# Patient Record
Sex: Male | Born: 1945 | Race: Black or African American | Hispanic: No | Marital: Married | Smoking: Never smoker
Health system: Southern US, Community
[De-identification: ages and names within clinical notes are randomized; demographics above are authoritative.]

## PROBLEM LIST (undated history)

## (undated) DIAGNOSIS — I10 Essential (primary) hypertension: Secondary | ICD-10-CM

## (undated) DIAGNOSIS — Z8709 Personal history of other diseases of the respiratory system: Secondary | ICD-10-CM

## (undated) DIAGNOSIS — N189 Chronic kidney disease, unspecified: Secondary | ICD-10-CM

## (undated) DIAGNOSIS — E119 Type 2 diabetes mellitus without complications: Secondary | ICD-10-CM

## (undated) DIAGNOSIS — C801 Malignant (primary) neoplasm, unspecified: Secondary | ICD-10-CM

## (undated) HISTORY — PX: TONSILLECTOMY: SUR1361

## (undated) HISTORY — PX: NASAL SINUS SURGERY: SHX719

## (undated) HISTORY — DX: Type 2 diabetes mellitus without complications: E11.9

---

## 1998-03-03 ENCOUNTER — Other Ambulatory Visit: Admission: RE | Admit: 1998-03-03 | Discharge: 1998-03-03 | Payer: Self-pay | Admitting: Otolaryngology

## 2002-12-31 ENCOUNTER — Ambulatory Visit (HOSPITAL_COMMUNITY): Admission: RE | Admit: 2002-12-31 | Discharge: 2002-12-31 | Payer: Self-pay | Admitting: Pulmonary Disease

## 2002-12-31 ENCOUNTER — Encounter: Payer: Self-pay | Admitting: Pulmonary Disease

## 2007-03-04 ENCOUNTER — Ambulatory Visit (HOSPITAL_COMMUNITY): Admission: RE | Admit: 2007-03-04 | Discharge: 2007-03-05 | Payer: Self-pay | Admitting: Otolaryngology

## 2007-03-04 ENCOUNTER — Encounter (INDEPENDENT_AMBULATORY_CARE_PROVIDER_SITE_OTHER): Payer: Self-pay | Admitting: Otolaryngology

## 2008-03-29 ENCOUNTER — Ambulatory Visit: Admission: RE | Admit: 2008-03-29 | Discharge: 2008-06-27 | Payer: Self-pay | Admitting: Radiation Oncology

## 2008-04-21 ENCOUNTER — Encounter: Admission: RE | Admit: 2008-04-21 | Discharge: 2008-04-21 | Payer: Self-pay | Admitting: Urology

## 2008-05-27 ENCOUNTER — Ambulatory Visit (HOSPITAL_BASED_OUTPATIENT_CLINIC_OR_DEPARTMENT_OTHER): Admission: RE | Admit: 2008-05-27 | Discharge: 2008-05-27 | Payer: Self-pay | Admitting: Urology

## 2010-07-10 LAB — CBC
HCT: 47.7 % (ref 39.0–52.0)
Hemoglobin: 15.7 g/dL (ref 13.0–17.0)
MCHC: 33 g/dL (ref 30.0–36.0)
MCV: 87.2 fL (ref 78.0–100.0)
Platelets: 215 10*3/uL (ref 150–400)
RBC: 5.47 MIL/uL (ref 4.22–5.81)
RDW: 14.3 % (ref 11.5–15.5)
WBC: 6 10*3/uL (ref 4.0–10.5)

## 2010-07-10 LAB — COMPREHENSIVE METABOLIC PANEL
ALT: 28 U/L (ref 0–53)
AST: 23 U/L (ref 0–37)
Albumin: 4.2 g/dL (ref 3.5–5.2)
Alkaline Phosphatase: 79 U/L (ref 39–117)
BUN: 22 mg/dL (ref 6–23)
CO2: 28 mEq/L (ref 19–32)
Calcium: 9.8 mg/dL (ref 8.4–10.5)
Chloride: 103 mEq/L (ref 96–112)
Creatinine, Ser: 1.11 mg/dL (ref 0.4–1.5)
GFR calc Af Amer: >60 mL/min (ref >60)
GFR calc non Af Amer: >60 mL/min (ref >60)
Glucose, Bld: 108 mg/dL — ABNORMAL HIGH (ref 70–99)
Potassium: 4.3 mEq/L (ref 3.5–5.1)
Sodium: 138 mEq/L (ref 135–145)
Total Bilirubin: 0.8 mg/dL (ref 0.3–1.2)
Total Protein: 6.6 g/dL (ref 6.0–8.3)

## 2010-07-10 LAB — APTT: aPTT: 30 seconds (ref 24–37)

## 2010-07-10 LAB — PROTIME-INR
INR: 1 (ref 0.00–1.49)
Prothrombin Time: 13 seconds (ref 11.6–15.2)

## 2010-08-07 NOTE — H&P (Signed)
NAMEABDULAHAD, Alec Jacobson                ACCOUNT NO.:  [id number]   MEDICAL RECORD NO.:  [id number]          PATIENT TYPE:  OIB   LOCATION:  2550                         FACILITY:  [facility]   PHYSICIAN:  Hermelinda Medicus, M.D.   DATE OF BIRTH:  03/12/1948   DATE OF ADMISSION:  03/04/2007  DATE OF DISCHARGE:                              HISTORY & PHYSICAL   HISTORY:  This patient is a 65 year old male whom I operated on when he  was age 37, in 64, where he had sinus issues.  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  He also has been apparently  outdoors a great deal and was exposed to a huge amount of dust, pine  pollen, grass pollen and had been using antibiotics and decongestants at  that time.  More recently his job has changed somewhat, but he has  returned.  He never really saw an allergist.  He never got allergy  shots.  He has used Veramyst nasal spray, but has bilateral polyps in  both nasal vestibules and in both maxillary and ethmoid sinuses.  He  also has frontal sinusitis.  His history is that he had an area where  the polyp had eroded the lamina papyracea on the right side.  He was  checked out very carefully in pathology and I was concerned about  inverted papilloma, but this was stated not to be the case.  But he now  re-enters the hospital here for a functional endoscopic sinus surgery,  bilateral frontal ethmoidectomy, bilateral maxillary sinus ostia  enlargement, with the removal of nasal polyps, being very careful of  that orbital wall lamina papyracea on the right side.  The patient is  well aware of this.  He has had some conjunctivitis in the past,  associated with sinusitis and apparently did have a dacryocystitis at  one time approximately three years ago.  He has been on antibiotics  recently, as well as a nasal spray and now enters for this surgery.   CURRENT  MEDICATIONS:  1. He is just taking Benicar/hydrochlorothiazide 40/12.5 mg.  2. Crestor 20 mg.  3. Veramyst.   ALLERGIES:  No known drug allergies.   PAST MEDICAL HISTORY:  He does have a slight blood pressure elevation at  150/90.   REVIEW OF SYSTEMS:  He has no pulmonary problems.  He does have the  hypertension problem, but pretty well under control.  He has no  endocrine, no urinary problems, no gastrointestinal problems.  His chest  x-ray is within normal limits.  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  He has never had any chest pain or any symptoms whatsoever.   PAST SURGICAL HISTORY:  The sinus surgery as previously mentioned.   PHYSICAL EXAMINATION:  GENERAL:  A well-developed and well-nourished  male, in no acute distress.  VITAL SIGNS:  Blood pressure 150/90, pulse 90.  HEENT:  Ears are clear.  The  tympanic membranes are clear and move well.  His nose shows nasal polyps on both sides, which are easy to see on  inspection.  He is somewhat congested.  Oral cavity is clear.  There is  no ulceration or mass.  Nasopharynx clear.  His sphenoid sinuses on CAT  scan are clear.  He does show the lamina papyracea dehiscence on that  right ethmoid on CAT scan.  His larynx is clear.  True cords and false  cords, epiglottis and base of tongue are clear of any ulceration or  mass.  Gag reflex normal.  The eustachian nerves are all symmetrical, as  is shoulder strength.  CHEST:  Clear.  No rubs, rhonchi or wheezes.  A chest x-ray is normal.  CARDIOVASCULAR:  No opening snaps, murmurs or gallops.  ABDOMEN:  Unremarkable.  EXTREMITIES:  Unremarkable.   INITIAL DIAGNOSES:  1. Bilateral sinusitis.  2. History of sinus surgery in 1999.  Right lamina papyracea      dehiscence at that time.  3. History of conjunctivitis.  4. History of allergic rhinitis and working in a contaminated      environment.   PLAN:   To do a functional endoscopic sinus surgery, bilateral nasal  polypectomy, bilateral maxillary sinus ostial enlargement with bilateral  frontal ethmoidectomies under local MAC anesthesia.  The patient is well  aware of the risks and gains of sinus surgery, and will be taking time  off.  No heavy lifting.  Will eat very carefully this evening, if he  gets sent home.  He may be kept for a 23-hour observation.           ______________________________  Hermelinda Medicus, M.D.     JC/MEDQ  D:  03/04/2007  T:  03/04/2007  Job:  [id number]   cc:   Mina Marble, M.D.

## 2010-08-07 NOTE — Op Note (Signed)
NAME:  Alec Jacobson, Alec Jacobson                ACCOUNT NO.:  [id number]   MEDICAL RECORD NO.:  [id number]          PATIENT TYPE:  AMB   LOCATION:  SDS                          FACILITY:  [facility]   PHYSICIAN:  Hermelinda Medicus, M.D.   DATE OF BIRTH:  03/12/1948   DATE OF PROCEDURE:  03/04/2007  DATE OF DISCHARGE:                               OPERATIVE REPORT   This patient is a 65 year old male who has had considerable dust and  contamination exposure of the past, who has had sinus surgery back in  1999 and now enters for sinus surgery once again.   PREOPERATIVE DIAGNOSIS:  1. Bilateral ethmoid, frontal sinusitis with nasal polyposis, with      bilateral maxillary sinusitis, right more than left.  2. 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   POSTOPERATIVE DIAGNOSES:  1. Bilateral ethmoid, frontal sinusitis with nasal polyposis, with      bilateral maxillary sinusitis, right more than left.  2. 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   OPERATOR:  Hermelinda Medicus, M.D.   ANESTHESIA:  Local MAC with Dr. Randa Evens, 1% Xylocaine with epinephrine 6  mL, and topical cocaine 200 mg.   The patient is aware the risks and gains.  He is aware of the fact that  he did have a lamina papyracea issue about 10 years ago.  An x-ray shows  a small dehiscence.  We will be very careful in that area when removing  the polyps.  He also has used nasal sprays but has never taken allergy  shots, but he has also changed his job to a much clear type of job.  He  is aware of the visual potential or diplopia potential and is aware that  after his surgery he is not to be doing any traveling or heavy lifting  for the next week to 2 weeks.   PROCEDURE:  The patient placed in the supine position, under local MAC  anesthesia he was anesthetized after prepping and draping with the usual  head drape.  The local anesthesia  above-mentioned was used and then the  left side was approached and considerable polyps that were totally  obliterating his nasal airway were removed, and these were extended into  the ethmoid sinus and the frontal sinus was suctioned of fluid, and  polyps were removed using the upbiting forceps and straight Blakesley-  Wilde forceps along with using the 0-degree scope.  Once these were  removed and were extending almost back to the choana, the maxillary  sinus was found to be in reasonably decent shape and was just increased,  the natural ostium was increased in size using the backbiting forceps.  Once this was completed, Gelfoam was placed within the ethmoid sinus  followed by a 10-cm Merocel.  The right side was then approached and was  further anesthetized with the 1% Xylocaine with epinephrine and topical  cocaine, and the polyps which were again huge and obstructive of the  entire nasal airway were removed  one by one very carefully, and as we  worked into the ethmoid sinus we worked medial to stay away the lamina  papyracea and not to disturb that area or cause any bleeding around the  orbit.  We removed most of these polyps.  We left a thickened area on  that lamina papyracea area and worked our way up to the frontal area,  where it would drain effectively, suctioned the frontal, and were able  to clear the polyps from the ethmoid sinus.  We checked his vision  during the time and checked for diplopia.  None was seen.  Once this was  completed, we then looked at the maxillary sinus on the right side and  increased its natural ostium and suctioned a considerable amount of  fluid from that side.  It was primarily fluid, not polyps.  The nose was  clear now.  Gelfoam was placed within the ethmoid sinus and then a 10-cm  Merocel pack was also placed.  The patient will be kept on cold  compresses and kept overnight here just to make sure that he has no  bleeding around that right eye and  he has had conjunctivitis and he has  had a dacryocystitis also associated  with that eye in the past.  The patient tolerated the procedure very  well, is as doing well postop.  His follow-up will be overnight at 23-  hour observation and then it will be in 5 days and 10 days, 3 weeks, 6  weeks, 3 months, 6 months and a year.           ______________________________  Hermelinda Medicus, M.D.     JC/MEDQ  D:  03/04/2007  T:  03/04/2007  Job:  [id number]   cc:   Mina Marble, M.D.

## 2010-08-07 NOTE — Op Note (Signed)
NAME:  Alec Jacobson, Alec Jacobson                ACCOUNT NO.:  [id number]   MEDICAL RECORD NO.:  [id number]          PATIENT TYPE:  AMB   LOCATION:  NESC                         FACILITY:  [facility]   PHYSICIAN:  Mark C. Vernie Ammons, M.D.  DATE OF BIRTH:  03/12/1948   DATE OF PROCEDURE:  05/27/2008  DATE OF DISCHARGE:                               OPERATIVE REPORT   PREOPERATIVE DIAGNOSIS:  Adenocarcinoma of the prostate.   POSTOPERATIVE DIAGNOSIS:  Adenocarcinoma of the prostate.   PROCEDURE:  Iodine-125 radioactive seed implantation.   SURGEON:  Mark C. Vernie Ammons, M.D.   RADIATION ONCOLOGIST:  Artist Pais. Kathrynn Running, M.D.   ANESTHESIA:  General.   DRAINS:  16-French Foley catheter.   BLOOD LOSS:  Minimal.   SPECIMENS:  None.   COMPLICATIONS:  None.   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  His clinical stage is T1c.  We have discussed the  treatment options and he has elected to proceed with radioactive seed  implant.   DESCRIPTION OF OPERATION:  After informed consent, the patient was  brought to the major OR and placed on the table, administered general  anesthesia, and then moved to the modified dorsal lithotomy position.  Genitalia and perineum was sterilely prepped and draped and a Foley  catheter was inserted in the bladder and filled with half strength  contrast.  A rectal tube as well as the transrectal ultrasound probe was  then placed in the rectum and real-time planning was performed by Dr.  Kathrynn Running.   After the plan was complete, I proceeded to use the Nucletron to place  the seeds according to the intraoperative plan.  During the procedure,  the Nucletron device malfunctioned and 10 needles required placement by  hand, which proceeded without difficulty, still using real-time  ultrasound for guidance.   I then removed the catheter and performed  cystoscopy and noted the  urethra to be normal down to the sphincter, which appears intact.  The  prostatic urethra revealed no lesions and no evidence of seeds within  the prostatic urethra.  I then entered the bladder and the bladder was  fully inspected and again noted to be free of any tumor, stones or  inflammatory lesions.  Ureteral orifices were noted to be of normal  configuration and position.  I inspected the floor of the bladder and  retroflexed the scope and I noted no seeds on the floor of the bladder,  and when the scope was retroflexed, I noted no seeds protruding from the  base of the prostate.   The cystoscope was removed and a new 16-French Foley catheter was  inserted in the bladder and the balloon was filled with water.  This was  connected to closed system drainage and the patient was awakened and  taken to recovery room in stable, satisfactory condition.  He tolerated  the procedure well with no intraoperative complications.   He will be given a prescription for Vicodin HP #  24 and Cipro 500 mg to  be taken b.i.d. for 5 days.  He will follow up with me in the office in  3 weeks.      Mark C. Vernie Ammons, M.D.  Electronically Signed     MCO/MEDQ  D:  05/27/2008  T:  05/27/2008  Job:  [id number]   cc:   Artist Pais Kathrynn Running, M.D.  Fax: [phone]

## 2010-11-13 ENCOUNTER — Emergency Department (HOSPITAL_COMMUNITY)
Admission: EM | Admit: 2010-11-13 | Discharge: 2010-11-13 | Disposition: A | Discharge status: Home / Self Care | Payer: Worker's Compensation | Attending: Emergency Medicine | Admitting: Emergency Medicine

## 2010-11-13 DIAGNOSIS — I10 Essential (primary) hypertension: Secondary | ICD-10-CM | POA: Insufficient documentation

## 2010-11-13 DIAGNOSIS — M79609 Pain in unspecified limb: Secondary | ICD-10-CM | POA: Insufficient documentation

## 2010-11-13 DIAGNOSIS — T6391XA Toxic effect of contact with unspecified venomous animal, accidental (unintentional), initial encounter: Secondary | ICD-10-CM | POA: Insufficient documentation

## 2010-11-13 DIAGNOSIS — E7800 Pure hypercholesterolemia, unspecified: Secondary | ICD-10-CM | POA: Insufficient documentation

## 2010-11-13 DIAGNOSIS — R229 Localized swelling, mass and lump, unspecified: Secondary | ICD-10-CM | POA: Insufficient documentation

## 2010-11-13 DIAGNOSIS — R609 Edema, unspecified: Secondary | ICD-10-CM | POA: Insufficient documentation

## 2010-11-13 DIAGNOSIS — T63481A Toxic effect of venom of other arthropod, accidental (unintentional), initial encounter: Secondary | ICD-10-CM | POA: Insufficient documentation

## 2010-12-31 LAB — CBC
HCT: 43.3
Hemoglobin: 15.1
MCHC: 34.8
MCV: 83.8
Platelets: 206
RBC: 5.17
RDW: 14.3
WBC: 5.7

## 2010-12-31 LAB — URINALYSIS, ROUTINE W REFLEX MICROSCOPIC
Bilirubin Urine: NEGATIVE
Glucose, UA: NEGATIVE
Ketones, ur: NEGATIVE
Leukocytes, UA: NEGATIVE
Nitrite: NEGATIVE
Protein, ur: NEGATIVE
Specific Gravity, Urine: 1.024
Urobilinogen, UA: 1
pH: 6.5

## 2010-12-31 LAB — BASIC METABOLIC PANEL
BUN: 17
Calcium: 9.5
GFR calc non Af Amer: >60
Glucose, Bld: 110 — ABNORMAL HIGH
Potassium: 4.5
Sodium: 139

## 2010-12-31 LAB — URINE MICROSCOPIC-ADD ON

## 2010-12-31 LAB — BASIC METABOLIC PANEL WITH GFR
CO2: 27
Chloride: 106
Creatinine, Ser: 1.03
GFR calc Af Amer: >60

## 2014-02-21 ENCOUNTER — Other Ambulatory Visit (HOSPITAL_COMMUNITY): Payer: Self-pay | Admitting: Otolaryngology

## 2014-04-01 NOTE — Pre-Procedure Instructions (Signed)
Alec Jacobson  04/01/2014    Your procedure is scheduled on:  Wednesday, January 20.  Report to [facility] Admitting at 9:15 AM.  Call this number if you have problems the morning of surgery: [phone]               For any other questions, please call [phone], Monday - Friday 8 AM - 4 PM.  Remember:   Do not eat food or drink liquids after midnight  Tuesday, January 19.  Take these medicines the morning of surgery with A SIP OF WATER: -   Do not wear jewelry, make-up or nail polish.  Do not wear lotions, powders, or perfumes.    Men may shave face and neck.  Do not bring valuables to the hospital.             [facility] is not responsible  for any belongings or valuables.               Contacts, dentures or bridgework may not be worn into surgery.  Leave suitcase in the car. After surgery it may be brought to your room.  For patients admitted to the hospital, discharge time is determined by your treatment team.               Patients discharged the day of surgery will not be allowed to drive home.  Name and phone number of your driver: -   Special Instructions: Review  [facility] - Preparing For Surgery.   Please read over the following fact sheets that you were given: Pain Booklet, Coughing and Deep Breathing and Surgical Site Infection Prevention

## 2014-04-04 ENCOUNTER — Encounter (HOSPITAL_COMMUNITY): Payer: Self-pay

## 2014-04-04 ENCOUNTER — Inpatient Hospital Stay (HOSPITAL_COMMUNITY)
Admission: RE | Admit: 2014-04-04 | Discharge: 2014-04-04 | Disposition: A | Discharge status: Home / Self Care | Payer: Self-pay | Source: Ambulatory Visit

## 2014-04-04 HISTORY — DX: Personal history of other diseases of the respiratory system: Z87.09

## 2014-04-08 NOTE — H&P (Signed)
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 He now presents for septoplasty and bilateral functional endoscopic sinus surgery.  Dr. Simeon Craft, [place] Dust has discussed the risks (bleeding, infection, need for revision surgery, septal perforation, anesthesia risks, scarring, etc.), benefits, and alternatives of this procedure. The patient understands the risks and would like to proceed with the procedure. The chances of success of the procedure are >50% and the patient understands this. I personally performed an examination of the patient within 24 hours of the procedure.  PAST MEDICAL HISTORY: Past Medical History  Diagnosis Date  . H/O allergic rhinitis     job related    PAST SURGICAL HISTORY: Past Surgical History  Procedure Laterality Date  . Nasal sinus surgery      MEDICATIONS: No current facility-administered medications on file prior to encounter.   No current outpatient prescriptions on file prior to encounter.    ALLERGIES: Allergies not on file  SOCIAL HISTORY: History   Social History  . Marital Status: Married    Spouse Name: N/A    Number of Children: N/A  . Years of Education: N/A   Occupational History  . Not on file.   Social History Main Topics  . Smoking status: Not on file  . Smokeless tobacco: Not on file  . Alcohol Use: Not on file  . Drug Use: Not on file  . Sexual Activity: Not on file   Other Topics Concern  . Not on file   Social History Narrative  . No narrative on file    FAMILY HISTORY:No family history on file.  REVIEW OF SYSTEMS:  HEENT: nasal congestion, sinusitis, facial pressure, nasal drainage, otherwise negative x 12 systems except per HPI  PHYSICAL EXAM:  GENERAL:  NAD VITAL SIGNS:   Filed Vitals:   04/13/14 0933  BP: 188/102   Pulse:   Temp:   Resp:   SKIN:  Warm,dry HEENT:  Bulky R>L nasal polyps and thick nasal mucous, leftward septal deviation NECK:  supple ABDOMEN:  soft MUSCULOSKELETAL: normal strength PSYCH:  Normal affect NEUROLOGIC:  CN 2-12 intact and symmetric  DIAGNOSTIC STUDIES: CT sinuses shows bulky R>L sinus polyposis and R>L pansinus opacification  ASSESSMENT AND PLAN: Plan to proceed with bilateral functional endoscopic sinus surgery with septoplasty and possible Draf III frontal sinusotomy. Patient understands the risks, benefits, and alternatives.  04/13/13  9:09 AM Alec Jacobson

## 2014-04-11 ENCOUNTER — Encounter (HOSPITAL_COMMUNITY): Payer: Self-pay

## 2014-04-11 ENCOUNTER — Encounter (HOSPITAL_COMMUNITY)
Admission: RE | Admit: 2014-04-11 | Discharge: 2014-04-11 | Disposition: A | Discharge status: Home / Self Care | Payer: 59 | Source: Ambulatory Visit | Attending: Otolaryngology | Admitting: Otolaryngology

## 2014-04-11 DIAGNOSIS — J320 Chronic maxillary sinusitis: Secondary | ICD-10-CM | POA: Diagnosis present

## 2014-04-11 DIAGNOSIS — E669 Obesity, unspecified: Secondary | ICD-10-CM | POA: Diagnosis not present

## 2014-04-11 DIAGNOSIS — J321 Chronic frontal sinusitis: Secondary | ICD-10-CM | POA: Diagnosis not present

## 2014-04-11 DIAGNOSIS — Z6832 Body mass index (BMI) 32.0-32.9, adult: Secondary | ICD-10-CM | POA: Diagnosis not present

## 2014-04-11 DIAGNOSIS — J322 Chronic ethmoidal sinusitis: Secondary | ICD-10-CM | POA: Diagnosis not present

## 2014-04-11 DIAGNOSIS — I10 Essential (primary) hypertension: Secondary | ICD-10-CM | POA: Diagnosis not present

## 2014-04-11 DIAGNOSIS — J323 Chronic sphenoidal sinusitis: Secondary | ICD-10-CM | POA: Diagnosis not present

## 2014-04-11 DIAGNOSIS — J339 Nasal polyp, unspecified: Secondary | ICD-10-CM | POA: Diagnosis not present

## 2014-04-11 HISTORY — DX: Malignant (primary) neoplasm, unspecified: C80.1

## 2014-04-11 HISTORY — DX: Essential (primary) hypertension: I10

## 2014-04-11 HISTORY — DX: Chronic kidney disease, unspecified: N18.9

## 2014-04-11 LAB — BASIC METABOLIC PANEL
ANION GAP: 7 (ref 5–15)
BUN: 19 mg/dL (ref 6–23)
CALCIUM: 9.2 mg/dL (ref 8.4–10.5)
CHLORIDE: 104 meq/L (ref 96–112)
CO2: 25 mmol/L (ref 19–32)
Creatinine, Ser: 1.04 mg/dL (ref 0.50–1.35)
GFR, EST AFRICAN AMERICAN: 84 mL/min — AB (ref >90)
GFR, EST NON AFRICAN AMERICAN: 72 mL/min — AB (ref >90)
Glucose, Bld: 120 mg/dL — ABNORMAL HIGH (ref 70–99)
Potassium: 4.8 mmol/L (ref 3.5–5.1)
Sodium: 136 mmol/L (ref 135–145)

## 2014-04-11 LAB — CBC
HEMATOCRIT: 44 % (ref 39.0–52.0)
Hemoglobin: 14.9 g/dL (ref 13.0–17.0)
MCH: 29 pg (ref 26.0–34.0)
MCHC: 33.9 g/dL (ref 30.0–36.0)
MCV: 85.6 fL (ref 78.0–100.0)
Platelets: 187 10*3/uL (ref 150–400)
RBC: 5.14 MIL/uL (ref 4.22–5.81)
RDW: 13.5 % (ref 11.5–15.5)
WBC: 3.9 10*3/uL — ABNORMAL LOW (ref 4.0–10.5)

## 2014-04-11 NOTE — Progress Notes (Signed)
   04/11/14 0820  OBSTRUCTIVE SLEEP APNEA  Have you ever been diagnosed with sleep apnea through a sleep study? No  Do you snore loudly (loud enough to be heard through closed doors)?  1 (only since polyps returned to his sinuses)  Do you often feel tired, fatigued, or sleepy during the daytime? 0  Has anyone observed you stop breathing during your sleep? 0  Do you have, or are you being treated for high blood pressure? 1  BMI more than 35 kg/m2? 1  Age over 38 years old? 1  Neck circumference greater than 40 cm/16 inches? 1  Gender: 1  Obstructive Sleep Apnea Score 6  Score 4 or greater  Results sent to PCP

## 2014-04-11 NOTE — Progress Notes (Addendum)
Denies any cardiac issues.  Has never seen a cardio.   Will call his PCP for comparison ekg, Dr. Katherine Roan [phone] Have called office twice, once this AM, and around 1:50pm today. Had to leave message.  DA

## 2014-04-13 ENCOUNTER — Encounter (HOSPITAL_COMMUNITY)
Admission: RE | Disposition: A | Discharge status: Home / Self Care | Payer: Self-pay | Source: Ambulatory Visit | Attending: Otolaryngology

## 2014-04-13 ENCOUNTER — Encounter (HOSPITAL_COMMUNITY): Payer: Self-pay | Admitting: *Deleted

## 2014-04-13 ENCOUNTER — Ambulatory Visit (HOSPITAL_COMMUNITY)
Admission: RE | Admit: 2014-04-13 | Discharge: 2014-04-13 | Disposition: A | Discharge status: Home / Self Care | Payer: 59 | Source: Ambulatory Visit | Attending: Otolaryngology | Admitting: Otolaryngology

## 2014-04-13 ENCOUNTER — Ambulatory Visit (HOSPITAL_COMMUNITY): Payer: 59 | Admitting: Anesthesiology

## 2014-04-13 DIAGNOSIS — E669 Obesity, unspecified: Secondary | ICD-10-CM | POA: Insufficient documentation

## 2014-04-13 DIAGNOSIS — J321 Chronic frontal sinusitis: Secondary | ICD-10-CM | POA: Diagnosis not present

## 2014-04-13 DIAGNOSIS — I10 Essential (primary) hypertension: Secondary | ICD-10-CM | POA: Insufficient documentation

## 2014-04-13 DIAGNOSIS — Z6832 Body mass index (BMI) 32.0-32.9, adult: Secondary | ICD-10-CM | POA: Insufficient documentation

## 2014-04-13 DIAGNOSIS — J339 Nasal polyp, unspecified: Secondary | ICD-10-CM | POA: Insufficient documentation

## 2014-04-13 DIAGNOSIS — J323 Chronic sphenoidal sinusitis: Secondary | ICD-10-CM | POA: Insufficient documentation

## 2014-04-13 DIAGNOSIS — J322 Chronic ethmoidal sinusitis: Secondary | ICD-10-CM | POA: Insufficient documentation

## 2014-04-13 HISTORY — PX: SINUS ENDO WITH FUSION: SHX5329

## 2014-04-13 SURGERY — SURGERY, PARANASAL SINUS, ENDOSCOPIC, WITH NASAL SEPTOPLASTY, TURBINOPLASTY, AND MAXILLARY SINUSOTOMY
Anesthesia: General | Site: Nose | Laterality: Bilateral

## 2014-04-13 MED ORDER — SODIUM CHLORIDE 0.9 % IR SOLN
Status: DC | PRN
Start: 2014-04-13 — End: 2014-04-13
  Administered 2014-04-13: 1000 mL

## 2014-04-13 MED ORDER — LACTATED RINGERS IV SOLN
INTRAVENOUS | Status: DC | PRN
  Administered 2014-04-13 (×3): via INTRAVENOUS

## 2014-04-13 MED ORDER — LACTATED RINGERS IV SOLN
INTRAVENOUS | Status: DC
  Administered 2014-04-13: 10:00:00 via INTRAVENOUS

## 2014-04-13 MED ORDER — LIDOCAINE HCL (CARDIAC) 20 MG/ML IV SOLN
INTRAVENOUS | Status: DC | PRN
  Administered 2014-04-13: 50 mg via INTRAVENOUS

## 2014-04-13 MED ORDER — OXYCODONE HCL 5 MG PO TABS
ORAL_TABLET | ORAL | Status: AC
  Filled 2014-04-13: qty 1

## 2014-04-13 MED ORDER — MUPIROCIN CALCIUM 2 % NA OINT
TOPICAL_OINTMENT | NASAL | Status: DC | PRN
  Administered 2014-04-13: 1 via NASAL

## 2014-04-13 MED ORDER — ONDANSETRON HCL 4 MG/2ML IJ SOLN: 4.0000 mg | Freq: Four times a day (QID) | INTRAMUSCULAR | Status: DC | PRN

## 2014-04-13 MED ORDER — LIDOCAINE-EPINEPHRINE 1 %-1:100000 IJ SOLN
INTRAMUSCULAR | Status: AC
  Filled 2014-04-13: qty 1

## 2014-04-13 MED ORDER — PHENYLEPHRINE HCL 10 MG/ML IJ SOLN
INTRAMUSCULAR | Status: DC | PRN
  Administered 2014-04-13: 120 ug via INTRAVENOUS
  Administered 2014-04-13 (×2): 80 ug via INTRAVENOUS

## 2014-04-13 MED ORDER — DEXAMETHASONE SODIUM PHOSPHATE 10 MG/ML IJ SOLN
10.0000 mg | Freq: Once | INTRAMUSCULAR | Status: AC
  Administered 2014-04-13: 10 mg via INTRAVENOUS
  Filled 2014-04-13: qty 1

## 2014-04-13 MED ORDER — MUPIROCIN 2 % EX OINT
TOPICAL_OINTMENT | CUTANEOUS | Status: AC
  Filled 2014-04-13: qty 22

## 2014-04-13 MED ORDER — HEMOSTATIC AGENTS (NO CHARGE) OPTIME
TOPICAL | Status: DC | PRN
  Administered 2014-04-13: 1 via TOPICAL

## 2014-04-13 MED ORDER — FENTANYL CITRATE 0.05 MG/ML IJ SOLN
INTRAMUSCULAR | Status: DC | PRN
Start: 2014-04-13 — End: 2014-04-13
  Administered 2014-04-13: 100 ug via INTRAVENOUS
  Administered 2014-04-13 (×3): 50 ug via INTRAVENOUS

## 2014-04-13 MED ORDER — FENTANYL CITRATE 0.05 MG/ML IJ SOLN
INTRAMUSCULAR | Status: AC
  Filled 2014-04-13: qty 5

## 2014-04-13 MED ORDER — CLINDAMYCIN PHOSPHATE 600 MG/50ML IV SOLN
600.0000 mg | Freq: Once | INTRAVENOUS | Status: AC
  Administered 2014-04-13: 600 mg via INTRAVENOUS
  Filled 2014-04-13: qty 50

## 2014-04-13 MED ORDER — EPHEDRINE SULFATE 50 MG/ML IJ SOLN
INTRAMUSCULAR | Status: DC | PRN
  Administered 2014-04-13: 15 mg via INTRAVENOUS
  Administered 2014-04-13 (×2): 10 mg via INTRAVENOUS

## 2014-04-13 MED ORDER — HYDROMORPHONE HCL 1 MG/ML IJ SOLN
INTRAMUSCULAR | Status: AC
  Filled 2014-04-13: qty 1

## 2014-04-13 MED ORDER — 0.9 % SODIUM CHLORIDE (POUR BTL) OPTIME
TOPICAL | Status: DC | PRN
  Administered 2014-04-13: 1000 mL

## 2014-04-13 MED ORDER — ARTIFICIAL TEARS OP OINT
TOPICAL_OINTMENT | OPHTHALMIC | Status: DC | PRN
  Administered 2014-04-13: 1 via OPHTHALMIC

## 2014-04-13 MED ORDER — OXYMETAZOLINE HCL 0.05 % NA SOLN
NASAL | Status: AC
  Filled 2014-04-13: qty 15

## 2014-04-13 MED ORDER — HYDROMORPHONE HCL 1 MG/ML IJ SOLN
0.2500 mg | INTRAMUSCULAR | Status: DC | PRN
  Administered 2014-04-13: 0.5 mg via INTRAVENOUS

## 2014-04-13 MED ORDER — LABETALOL HCL 5 MG/ML IV SOLN
INTRAVENOUS | Status: DC | PRN
  Administered 2014-04-13 (×3): 5 mg via INTRAVENOUS

## 2014-04-13 MED ORDER — OXYMETAZOLINE HCL 0.05 % NA SOLN
NASAL | Status: DC | PRN
  Administered 2014-04-13: 1

## 2014-04-13 MED ORDER — PROPOFOL 10 MG/ML IV BOLUS
INTRAVENOUS | Status: DC | PRN
Start: 2014-04-13 — End: 2014-04-13
  Administered 2014-04-13: 150 mg via INTRAVENOUS

## 2014-04-13 MED ORDER — OXYCODONE HCL 5 MG/5ML PO SOLN: 5.0000 mg | Freq: Once | ORAL | Status: DC | PRN

## 2014-04-13 MED ORDER — MENTHOL 3 MG MT LOZG
LOZENGE | OROMUCOSAL | Status: AC
  Filled 2014-04-13: qty 9

## 2014-04-13 MED ORDER — ONDANSETRON HCL 4 MG/2ML IJ SOLN
INTRAMUSCULAR | Status: AC
  Filled 2014-04-13: qty 2

## 2014-04-13 MED ORDER — ROCURONIUM BROMIDE 100 MG/10ML IV SOLN
INTRAVENOUS | Status: DC | PRN
  Administered 2014-04-13: 50 mg via INTRAVENOUS

## 2014-04-13 MED ORDER — GLYCOPYRROLATE 0.2 MG/ML IJ SOLN
INTRAMUSCULAR | Status: AC
  Filled 2014-04-13: qty 3

## 2014-04-13 MED ORDER — SODIUM CHLORIDE 0.9 % IJ SOLN
INTRAMUSCULAR | Status: AC
  Filled 2014-04-13: qty 10

## 2014-04-13 MED ORDER — LIDOCAINE-EPINEPHRINE 1 %-1:100000 IJ SOLN
INTRAMUSCULAR | Status: DC | PRN
  Administered 2014-04-13: 20 mL

## 2014-04-13 MED ORDER — ARTIFICIAL TEARS OP OINT
TOPICAL_OINTMENT | OPHTHALMIC | Status: AC
  Filled 2014-04-13: qty 3.5

## 2014-04-13 MED ORDER — OXYCODONE HCL 5 MG PO TABS: 5.0000 mg | ORAL_TABLET | Freq: Once | ORAL | Status: DC | PRN

## 2014-04-13 MED ORDER — EPHEDRINE SULFATE 50 MG/ML IJ SOLN
INTRAMUSCULAR | Status: AC
  Filled 2014-04-13: qty 1

## 2014-04-13 MED ORDER — FLUORESCEIN SODIUM 1 MG OP STRP
ORAL_STRIP | OPHTHALMIC | Status: AC
  Filled 2014-04-13: qty 1

## 2014-04-13 MED ORDER — PHENYLEPHRINE 40 MCG/ML (10ML) SYRINGE FOR IV PUSH (FOR BLOOD PRESSURE SUPPORT)
PREFILLED_SYRINGE | INTRAVENOUS | Status: AC
  Filled 2014-04-13: qty 10

## 2014-04-13 SURGICAL SUPPLY — 62 items
ATTRACTOMAT 16X20 MAGNETIC DRP (DRAPES) IMPLANT
BENZOIN TINCTURE PRP APPL 2/3 (GAUZE/BANDAGES/DRESSINGS) ×3 IMPLANT
BLADE RAD 40 CVD SINUS 4MM (BLADE) IMPLANT
BLADE RAD60 ROTATE M4 4 5PK (BLADE) ×2 IMPLANT
BLADE RAD60 ROTATE M4 4MM 5PK (BLADE) ×1
BLADE ROTATE RAD 40 4 M4 (BLADE) IMPLANT
BLADE ROTATE RAD 40 4MM M4 (BLADE)
BLADE ROTATE TRICUT 4MX13CM M4 (BLADE) ×1
BLADE ROTATE TRICUT 4X13 M4 (BLADE) ×2 IMPLANT
BUR DIAMOND 13CMX5MM 70DEG (BURR)
BUR DIAMOND 13X5 70D (BURR) IMPLANT
BUR DIAMOND 15CMX5MM 15SINUS (BURR)
BUR DIAMOND CURV 15X5 15D (BURR) IMPLANT
CANISTER SUCTION 2500CC (MISCELLANEOUS) ×6 IMPLANT
CLOSURE WOUND 1/2 X4 (GAUZE/BANDAGES/DRESSINGS) ×1
COAGULATOR SUCT 6 FR SWTCH (ELECTROSURGICAL) ×1
COAGULATOR SUCT SWTCH 10FR 6 (ELECTROSURGICAL) ×2 IMPLANT
CONT SPEC 4OZ CLIKSEAL STRL BL (MISCELLANEOUS) ×3 IMPLANT
CORDS BIPOLAR (ELECTRODE) IMPLANT
CRADLE DONUT ADULT HEAD (MISCELLANEOUS) ×3 IMPLANT
DRESSING NASAL POPE 10X1.5X2.5 (GAUZE/BANDAGES/DRESSINGS) ×2 IMPLANT
DRSG NASAL POPE 10X1.5X2.5 (GAUZE/BANDAGES/DRESSINGS) ×6
DRSG NASOPORE 8CM (GAUZE/BANDAGES/DRESSINGS) IMPLANT
DURASEAL SPINE SEALANT 3ML (MISCELLANEOUS) ×3 IMPLANT
ELECT REM PT RETURN 9FT ADLT (ELECTROSURGICAL) ×3
ELECTRODE REM PT RTRN 9FT ADLT (ELECTROSURGICAL) ×1 IMPLANT
FILTER ARTHROSCOPY CONVERTOR (FILTER) ×3 IMPLANT
FLOSEAL 10ML (HEMOSTASIS) IMPLANT
GLOVE BIO SURGEON STRL SZ7 (GLOVE) ×3 IMPLANT
GLOVE BIOGEL PI IND STRL 7.0 (GLOVE) ×1 IMPLANT
GLOVE BIOGEL PI INDICATOR 7.0 (GLOVE) ×2
GLOVE ECLIPSE 7.0 STRL STRAW (GLOVE) ×6 IMPLANT
GLOVE SURG SS PI 7.0 STRL IVOR (GLOVE) ×3 IMPLANT
GLOVE SURG SS PI 7.5 STRL IVOR (GLOVE) ×9 IMPLANT
GOWN STRL REUS W/ TWL LRG LVL3 (GOWN DISPOSABLE) ×4 IMPLANT
GOWN STRL REUS W/TWL LRG LVL3 (GOWN DISPOSABLE) ×8
KIT BASIN OR (CUSTOM PROCEDURE TRAY) ×3 IMPLANT
KIT ROOM TURNOVER OR (KITS) ×3 IMPLANT
NEEDLE HYPO 25GX1X1/2 BEV (NEEDLE) ×3 IMPLANT
NEEDLE SPNL 20GX3.5 QUINCKE YW (NEEDLE) ×3 IMPLANT
NS IRRIG 1000ML POUR BTL (IV SOLUTION) ×3 IMPLANT
PAD ARMBOARD 7.5X6 YLW CONV (MISCELLANEOUS) ×6 IMPLANT
PAD ENT ADHESIVE 25PK (MISCELLANEOUS) ×3 IMPLANT
PATTIES SURGICAL .5 X3 (DISPOSABLE) ×3 IMPLANT
SHEATH ENDOSCRUB 0 DEG (SHEATH) ×3 IMPLANT
SHEATH ENDOSCRUB 30 DEG (SHEATH) IMPLANT
SHEATH ENDOSCRUB 45 DEG (SHEATH) ×3 IMPLANT
SOLUTION ANTI FOG 6CC (MISCELLANEOUS) ×3 IMPLANT
STRIP CLOSURE SKIN 1/2X4 (GAUZE/BANDAGES/DRESSINGS) ×2 IMPLANT
SUT ETHILON 3 0 PS 1 (SUTURE) IMPLANT
SUT SILK 2 0 SH (SUTURE) ×6 IMPLANT
SYR 50ML SLIP (SYRINGE) IMPLANT
SYRINGE 10CC LL (SYRINGE) ×3 IMPLANT
TOWEL OR 17X26 10 PK STRL BLUE (TOWEL DISPOSABLE) ×3 IMPLANT
TRACKER ENT INSTRUMENT (MISCELLANEOUS) ×3 IMPLANT
TRACKER ENT PATIENT (MISCELLANEOUS) ×3 IMPLANT
TRAY ENT MC OR (CUSTOM PROCEDURE TRAY) ×3 IMPLANT
TUBE ANAEROBIC SPECIMEN COL (MISCELLANEOUS) ×3 IMPLANT
TUBE CONNECTING 12'X1/4 (SUCTIONS) ×1
TUBE CONNECTING 12X1/4 (SUCTIONS) ×2 IMPLANT
TUBING STRAIGHTSHOT EPS 5PK (TUBING) ×3 IMPLANT
WIPE INSTRUMENT VISIWIPE 73X73 (MISCELLANEOUS) ×3 IMPLANT

## 2014-04-13 NOTE — Transfer of Care (Signed)
Immediate Anesthesia Transfer of Care Note  [name]  Procedure(s) Performed: Procedure(s): SINUS ENDO WITH FUSION (Bilateral)  Patient Location: PACU  Anesthesia Type:General  Level of Consciousness: awake, alert  and oriented  Airway & Oxygen Therapy: Patient Spontanous Breathing and Patient connected to face mask oxygen  Post-op Assessment: Report given to PACU RN, Post -op Vital signs reviewed and stable and Patient moving all extremities X 4  Post vital signs: Reviewed and stable  Complications: No apparent anesthesia complications

## 2014-04-13 NOTE — Op Note (Signed)
2:11 PM  04/13/2014  Surgeon: Ruby Cola  Procedures Performed: [id number] image guidance [phone] bilateral total ethmoidectomies [phone] bilateral maxillary antrostomies with tissue removal 31276-50 bilateral Draf III frontal sinusotomies 31288-50 bilateral sphenoidotomies with tissue removal  Operative Findings: bilateral Onodi cells communicated with the true sphenoid sinuses. Minimal very low flow right anterior cribriform plate CSF leak repaired with cautery, right superior turbinate mucosal graft, and duraseal with intraoperative CSF leak resolution. No vascular injury BL. No orbital, lamina papyrecea, or globe  injury BL. Bilateral bulky sinonasal polyposis, R>L purulence, and bilateral allergic mucin.  Specimens: right and left sinus contents.  COMPLICATIONS: none  PREOPERATIVE DIAGNOSIS: chronic pansinusitis and sinonasal polyposis.   POSTOPERATIVE DIAGNOSIS: chronic pansinusitis and sinonasal polyposis.    ANESTHESIA: General endotracheal.  ESTIMATED BLOOD LOSS: Approximately 250 mL.  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  PROCEDURE: The patient was brought to the operating room and placed in the supine position. After adequate endotracheal anesthesia was obtained, the skin was draped in sterile fashion. Lidocaine 1% with 1:100,000 epinephrine was injected into the bilateral greater palatine foramina transorally.   Attention then was directed toward the left sinuses. Lidocaine 1% with 1:100,000 epinephrine was injected in the region of the anterior portion of the left middle turbinate and uncinate process. The polypoid uncinate process remnant was identified using the 0 degree endoscope and the left uncinate process was  removed systematically superiorly to inferiorly with the debrider. Next, the left  maxillary sinus was identified and the medial wall of the left maxillary sinus and prior antrostomy was widely opened using the microdebrider. I then switched to the 45 degree scope and inspected the maxillary sinus. The left maxillary polyposis and allergic mucin was removed using the microdebrider. The natural ostium was widely opened using the backbiter.  The left anterior and posterior ethmoid air cells were entered after identifying the left ethmoid bulla using the image guidance suction and dissected up to the skull base and out to the lamina papyrecea using the 55 degree curette and the 28mm Kerrison punch. All of the ethmoid cells with bulky polyposis and allergic mucin were meticulously dissected out using the Kerrison and debrider. The skull base and lamina papyracea were preserved throughout.   The left sphenoid natural ostium was then identified using the image guidance suction and the left sphenoid was widely opened using the 31mm Kerrison all the way to the skull base and lamina papyracea. An Onodi cell containing the left optic nerve was identified superior to the true sphenoid and this was widely opened and communicated with the true sphenoid using the 29mm Kerrison. Sphenoid polyposis and allergic mucin was debrided.  Next I switched back to the 45 degree endoscope, 60 degree microdebrider, and curved image guidance suction. The skull base was identified and dissected anteriorly using the 90 degree Kuhn-Bolger curette. The remnant Agger Nasi cell was taken down and then the left frontal sinus was widely opened using the curved debrider, Stammberger 63mm frontal mushroom punch, and the 90 degree curette. The left middle turbinate remnant was polypoid and was largely removed. I removed the anterior/superior portion of the left middle turbinate and debrided copious allergic mucin and polyposis filling the left frontal sinus. There was a leftward septal deviation but I was able to access the left  sinuses.  Attention then was directed toward the right sinuses. Lidocaine  1% with 1:100,000 epinephrine was injected in the region of the anterior portion of the right middle turbinate and polypoid residual uncinate process. After I removed copious bulky polyposis filling the entire right nasal cavity the right residual uncinate process was identified using the 0 degree endoscope and the right uncinate process was removed systematically superiorly to inferiorly with back-biting forceps and debrider. The right middle turbinate was polypoid and was largely removed. Next, the right maxillary sinus with the bulky polyposis and allergic mucin was cultured and then  the medial wall of the right maxillary sinus was widely opened using the microdebrider. I then switched to the 45 degree scope and inspected the maxillary sinus. The right maxillary polyposis and allergic mucin was meticulously removed with the 0 and 60 degree micodebriders. I then switched back to the zero degree scope and straight microdebrider.  The right anterior and posterior ethmoid air cells were entered after identifying the right ethmoid bulla using the image guidance suction and dissected up to the skull base and out to the lamina papyrecea using the 55 degree curette and the 45mm Kerrison punch. All of the ethmoid cells and bulky ethmoid polyposis and allergic mucin were meticulously dissected out using the Kerrison and debrider. Copious ethmoid polyposis and inspissated mucous was encountered and meticulously removed using the microdebrider. The skull base and lamina papyracea were preserved throughout.   The right sphenoid natural ostium was then identified using the image guidance suction and the right sphenoid was widely opened using the 72mm Kerrison all the way to the skull base and lamina papyracea. Similar to the left side, an Onodi cell containing the right optic nerve was identified superior to the true sphenoid and this was widely  opened and communicated with the true sphenoid using the 96mm Kerrison. Right sphenoid polyposis and allergic mucin was removed using the microdebrider.  Next I switched back to the 45 degree endoscope, 60 degree microdebrider, and curved image guidance suction. The skull base was identified and dissected anteriorly using the 90 degree Kuhn-Bolger currette. The right residual Agger Nasi cell was taken down and then the right frontal sinus was widely opened using the curved debrider, Stammberger 22mm frontal mushroom punch, and the 90 degree currette. The right middle turbinate remnant was trimmed using the side-to-side Kuhn frontal thru-cut. I then used the 90 degree currette and 60 degree debrider to make a superior septectomy and to remove the entire frontal intersinus septum and the bulky bilateral frontal polyposis and allergic mucin and to complete a Draf III frontal sinusotomy. On the right at the anterior cribriform plate there was noted to be a very small, very low flow CSF leak/washout sign in the bulky polyposis. The CSF leak actually resolved spontaneously while the Draf III was completed. I then repaired the very small right cribriform CSF leak by removing the right superior turbinate and filleting it open and placing the superior turbinate graft with the periosteal side down and the mucosal side facing out over the entire right anterior cribriform. I then bolstered this repair with duraseal and then I packed the Draf III and superior septectomy with surgicel. No further CSF drainage or washout sign was noted.  A thorough irrigation and suctioning as then carried out in the nasal cavity, and bilateral 10cm merocele/fingercot spacers were placed in the  middle meatal/ethmoid cavities and secured to the patient's cheeks with silk/benzoin/steristrips. The patient's stomach was suctioned out using a flexible OGtube. The patient was awakened from anesthesia and extubated without difficulty.  The patient  tolerated the procedure well and returned to the recovery room in stable condition.   Dr. Ruby Cola was present and performed the entire procedure. 2:11 PM  04/13/2014 Ruby Cola

## 2014-04-13 NOTE — Anesthesia Postprocedure Evaluation (Signed)
  Anesthesia Post-op Note  [name]  Procedure(s) Performed: Procedure(s): SINUS ENDO WITH FUSION (Bilateral)  Patient Location: PACU  Anesthesia Type: General   Level of Consciousness: awake, alert  and oriented  Airway and Oxygen Therapy: Patient Spontanous Breathing  Post-op Pain: mild  Post-op Assessment: Post-op Vital signs reviewed  Post-op Vital Signs: Reviewed  Last Vitals:  Filed Vitals:   04/13/14 1415  BP: 165/107  Pulse: 80  Temp: 36.4 C  Resp: 12    Complications: No apparent anesthesia complications

## 2014-04-13 NOTE — Anesthesia Procedure Notes (Signed)
Procedure Name: Intubation Date/Time: 04/13/2014 11:29 AM Performed by: Neldon Newport Pre-anesthesia Checklist: Patient being monitored, Suction available, Emergency Drugs available, Patient identified and Timeout performed Patient Re-evaluated:Patient Re-evaluated prior to inductionOxygen Delivery Method: Circle system utilized Preoxygenation: Pre-oxygenation with 100% oxygen Intubation Type: IV induction Ventilation: Mask ventilation without difficulty Laryngoscope Size: Mac and 3 Grade View: Grade I Tube type: Oral Tube size: 7.5 mm Number of attempts: 1 Placement Confirmation: positive ETCO2,  ETT inserted through vocal cords under direct vision and breath sounds checked- equal and bilateral Secured at: 23 cm Tube secured with: Tape Dental Injury: Teeth and Oropharynx as per pre-operative assessment

## 2014-04-13 NOTE — Anesthesia Preprocedure Evaluation (Addendum)
Anesthesia Evaluation  Patient identified by MRN, date of birth, ID band Patient awake    Reviewed: Allergy & Precautions, NPO status , Patient's Chart, lab work & pertinent test results  Airway Mallampati: II   Neck ROM: full    Dental  (+) Teeth Intact, Dental Advidsory Given   Pulmonary neg pulmonary ROS,          Cardiovascular hypertension,     Neuro/Psych    GI/Hepatic   Endo/Other  obese  Renal/GU      Musculoskeletal   Abdominal   Peds  Hematology   Anesthesia Other Findings   Reproductive/Obstetrics                            Anesthesia Physical Anesthesia Plan  ASA: II  Anesthesia Plan: General   Post-op Pain Management:    Induction: Intravenous  Airway Management Planned: Oral ETT  Additional Equipment:   Intra-op Plan:   Post-operative Plan: Extubation in OR  Informed Consent: I have reviewed the patients History and Physical, chart, labs and discussed the procedure including the risks, benefits and alternatives for the proposed anesthesia with the patient or authorized representative who has indicated his/her understanding and acceptance.   Dental Advisory Given  Plan Discussed with: CRNA, Anesthesiologist and Surgeon  Anesthesia Plan Comments:        Anesthesia Quick Evaluation

## 2014-04-13 NOTE — Discharge Instructions (Signed)
Follow up with Dr. Simeon Craft in one week. No nose blowing or straining/heavy lifting x 2 weeks. Rx given to family/sent to pharmacy. Advance diet as tolerated.

## 2014-04-14 ENCOUNTER — Encounter (HOSPITAL_COMMUNITY): Payer: Self-pay | Admitting: Otolaryngology

## 2014-04-15 LAB — CULTURE, ROUTINE-SINUS

## 2015-04-22 ENCOUNTER — Emergency Department (HOSPITAL_COMMUNITY): Payer: 59

## 2015-04-22 ENCOUNTER — Inpatient Hospital Stay (HOSPITAL_COMMUNITY)
Admission: EM | Admit: 2015-04-22 | Discharge: 2015-04-26 | DRG: 638 | Disposition: A | Discharge status: Home / Self Care | Payer: 59 | Attending: Internal Medicine | Admitting: Internal Medicine

## 2015-04-22 ENCOUNTER — Encounter (HOSPITAL_COMMUNITY): Payer: Self-pay | Admitting: Emergency Medicine

## 2015-04-22 DIAGNOSIS — B370 Candidal stomatitis: Secondary | ICD-10-CM

## 2015-04-22 DIAGNOSIS — N179 Acute kidney failure, unspecified: Secondary | ICD-10-CM | POA: Diagnosis present

## 2015-04-22 DIAGNOSIS — Z8546 Personal history of malignant neoplasm of prostate: Secondary | ICD-10-CM | POA: Diagnosis not present

## 2015-04-22 DIAGNOSIS — R4182 Altered mental status, unspecified: Secondary | ICD-10-CM | POA: Insufficient documentation

## 2015-04-22 DIAGNOSIS — E875 Hyperkalemia: Secondary | ICD-10-CM | POA: Diagnosis present

## 2015-04-22 DIAGNOSIS — I129 Hypertensive chronic kidney disease with stage 1 through stage 4 chronic kidney disease, or unspecified chronic kidney disease: Secondary | ICD-10-CM | POA: Diagnosis present

## 2015-04-22 DIAGNOSIS — E1311 Other specified diabetes mellitus with ketoacidosis with coma: Secondary | ICD-10-CM | POA: Diagnosis not present

## 2015-04-22 DIAGNOSIS — N189 Chronic kidney disease, unspecified: Secondary | ICD-10-CM | POA: Diagnosis present

## 2015-04-22 DIAGNOSIS — B3789 Other sites of candidiasis: Secondary | ICD-10-CM | POA: Diagnosis present

## 2015-04-22 DIAGNOSIS — E1110 Type 2 diabetes mellitus with ketoacidosis without coma: Secondary | ICD-10-CM | POA: Diagnosis present

## 2015-04-22 DIAGNOSIS — I152 Hypertension secondary to endocrine disorders: Secondary | ICD-10-CM

## 2015-04-22 DIAGNOSIS — E119 Type 2 diabetes mellitus without complications: Secondary | ICD-10-CM | POA: Diagnosis not present

## 2015-04-22 DIAGNOSIS — E870 Hyperosmolality and hypernatremia: Secondary | ICD-10-CM | POA: Diagnosis present

## 2015-04-22 DIAGNOSIS — R739 Hyperglycemia, unspecified: Secondary | ICD-10-CM

## 2015-04-22 DIAGNOSIS — E860 Dehydration: Secondary | ICD-10-CM | POA: Diagnosis present

## 2015-04-22 DIAGNOSIS — I10 Essential (primary) hypertension: Secondary | ICD-10-CM

## 2015-04-22 DIAGNOSIS — I444 Left anterior fascicular block: Secondary | ICD-10-CM | POA: Diagnosis present

## 2015-04-22 DIAGNOSIS — D696 Thrombocytopenia, unspecified: Secondary | ICD-10-CM | POA: Diagnosis not present

## 2015-04-22 DIAGNOSIS — E785 Hyperlipidemia, unspecified: Secondary | ICD-10-CM | POA: Diagnosis present

## 2015-04-22 DIAGNOSIS — R197 Diarrhea, unspecified: Secondary | ICD-10-CM | POA: Diagnosis not present

## 2015-04-22 DIAGNOSIS — E1101 Type 2 diabetes mellitus with hyperosmolarity with coma: Secondary | ICD-10-CM

## 2015-04-22 DIAGNOSIS — E1310 Other specified diabetes mellitus with ketoacidosis without coma: Secondary | ICD-10-CM | POA: Diagnosis present

## 2015-04-22 DIAGNOSIS — Z23 Encounter for immunization: Secondary | ICD-10-CM | POA: Diagnosis not present

## 2015-04-22 DIAGNOSIS — E1159 Type 2 diabetes mellitus with other circulatory complications: Secondary | ICD-10-CM

## 2015-04-22 LAB — BASIC METABOLIC PANEL
ANION GAP: 18 — AB (ref 5–15)
ANION GAP: 14 (ref 5–15)
BUN: 71 mg/dL — ABNORMAL HIGH (ref 6–20)
BUN: 72 mg/dL — ABNORMAL HIGH (ref 6–20)
BUN: 68 mg/dL — AB (ref 6–20)
CALCIUM: 10.2 mg/dL (ref 8.9–10.3)
CALCIUM: 10 mg/dL (ref 8.9–10.3)
CALCIUM: 9.4 mg/dL (ref 8.9–10.3)
CHLORIDE: 122 mmol/L — AB (ref 101–111)
CHLORIDE: 128 mmol/L — AB (ref 101–111)
CO2: 18 mmol/L — AB (ref 22–32)
CO2: 20 mmol/L — AB (ref 22–32)
CO2: 22 mmol/L (ref 22–32)
CREATININE: 2.41 mg/dL — AB (ref 0.61–1.24)
Creatinine, Ser: 2.71 mg/dL — ABNORMAL HIGH (ref 0.61–1.24)
Creatinine, Ser: 2.48 mg/dL — ABNORMAL HIGH (ref 0.61–1.24)
GFR calc Af Amer: 30 mL/min — ABNORMAL LOW (ref >60)
GFR calc non Af Amer: 23 mL/min — ABNORMAL LOW (ref >60)
GFR calc non Af Amer: 25 mL/min — ABNORMAL LOW (ref >60)
GFR calc non Af Amer: 26 mL/min — ABNORMAL LOW (ref >60)
GFR, EST AFRICAN AMERICAN: 26 mL/min — AB (ref >60)
GFR, EST AFRICAN AMERICAN: 29 mL/min — AB (ref >60)
Glucose, Bld: 555 mg/dL (ref 65–99)
Glucose, Bld: 372 mg/dL — ABNORMAL HIGH (ref 65–99)
Glucose, Bld: 224 mg/dL — ABNORMAL HIGH (ref 65–99)
Potassium: 3.8 mmol/L (ref 3.5–5.1)
Potassium: 4.7 mmol/L (ref 3.5–5.1)
Potassium: 4.1 mmol/L (ref 3.5–5.1)
SODIUM: 158 mmol/L — AB (ref 135–145)
SODIUM: 162 mmol/L — AB (ref 135–145)
Sodium: 167 mmol/L (ref 135–145)

## 2015-04-22 LAB — URINALYSIS, ROUTINE W REFLEX MICROSCOPIC
Glucose, UA: >1000 mg/dL — AB
Ketones, ur: 15 mg/dL — AB
Leukocytes, UA: NEGATIVE
Nitrite: NEGATIVE
PH: 5 (ref 5.0–8.0)
Protein, ur: 30 mg/dL — AB
SPECIFIC GRAVITY, URINE: 1.034 — AB (ref 1.005–1.030)

## 2015-04-22 LAB — CBC WITH DIFFERENTIAL/PLATELET
Basophils Absolute: 0 10*3/uL (ref 0.0–0.1)
Basophils Relative: 0 %
EOS ABS: 0 10*3/uL (ref 0.0–0.7)
Eosinophils Relative: 0 %
HEMATOCRIT: 54.5 % — AB (ref 39.0–52.0)
HEMOGLOBIN: 17.4 g/dL — AB (ref 13.0–17.0)
LYMPHS ABS: 0.6 10*3/uL — AB (ref 0.7–4.0)
Lymphocytes Relative: 5 %
MCH: 26.6 pg (ref 26.0–34.0)
MCHC: 31.9 g/dL (ref 30.0–36.0)
MCV: 83.5 fL (ref 78.0–100.0)
Monocytes Absolute: 1.1 10*3/uL — ABNORMAL HIGH (ref 0.1–1.0)
Monocytes Relative: 8 %
NEUTROS ABS: 11.5 10*3/uL — AB (ref 1.7–7.7)
Neutrophils Relative %: 87 %
Platelets: 220 10*3/uL (ref 150–400)
RBC: 6.53 MIL/uL — ABNORMAL HIGH (ref 4.22–5.81)
RDW: 18.6 % — ABNORMAL HIGH (ref 11.5–15.5)
WBC: 13.2 10*3/uL — ABNORMAL HIGH (ref 4.0–10.5)

## 2015-04-22 LAB — COMPREHENSIVE METABOLIC PANEL
ALT: 26 U/L (ref 17–63)
AST: 19 U/L (ref 15–41)
Albumin: 3.4 g/dL — ABNORMAL LOW (ref 3.5–5.0)
Alkaline Phosphatase: 141 U/L — ABNORMAL HIGH (ref 38–126)
Anion gap: 23 — ABNORMAL HIGH (ref 5–15)
BUN: 78 mg/dL — AB (ref 6–20)
CALCIUM: 10.2 mg/dL (ref 8.9–10.3)
CO2: 17 mmol/L — AB (ref 22–32)
Chloride: 112 mmol/L — ABNORMAL HIGH (ref 101–111)
Creatinine, Ser: 3.38 mg/dL — ABNORMAL HIGH (ref 0.61–1.24)
GFR calc Af Amer: 20 mL/min — ABNORMAL LOW (ref >60)
GFR calc non Af Amer: 17 mL/min — ABNORMAL LOW (ref >60)
GLUCOSE: 983 mg/dL — AB (ref 65–99)
Potassium: 5.9 mmol/L — ABNORMAL HIGH (ref 3.5–5.1)
SODIUM: 152 mmol/L — AB (ref 135–145)
Total Bilirubin: 1 mg/dL (ref 0.3–1.2)
Total Protein: 7.2 g/dL (ref 6.5–8.1)

## 2015-04-22 LAB — CREATININE, SERUM
Creatinine, Ser: 2.72 mg/dL — ABNORMAL HIGH (ref 0.61–1.24)
GFR calc Af Amer: 26 mL/min — ABNORMAL LOW (ref >60)
GFR calc non Af Amer: 22 mL/min — ABNORMAL LOW (ref >60)

## 2015-04-22 LAB — GLUCOSE, CAPILLARY
GLUCOSE-CAPILLARY: 264 mg/dL — AB (ref 65–99)
GLUCOSE-CAPILLARY: 143 mg/dL — AB (ref 65–99)
Glucose-Capillary: 126 mg/dL — ABNORMAL HIGH (ref 65–99)
Glucose-Capillary: 199 mg/dL — ABNORMAL HIGH (ref 65–99)
Glucose-Capillary: 225 mg/dL — ABNORMAL HIGH (ref 65–99)
Glucose-Capillary: 254 mg/dL — ABNORMAL HIGH (ref 65–99)
Glucose-Capillary: 267 mg/dL — ABNORMAL HIGH (ref 65–99)
Glucose-Capillary: 313 mg/dL — ABNORMAL HIGH (ref 65–99)

## 2015-04-22 LAB — CBG MONITORING, ED
GLUCOSE-CAPILLARY: 534 mg/dL — AB (ref 65–99)
GLUCOSE-CAPILLARY: 448 mg/dL — AB (ref 65–99)
GLUCOSE-CAPILLARY: 411 mg/dL — AB (ref 65–99)
Glucose-Capillary: >600 mg/dL (ref 65–99)
Glucose-Capillary: >600 mg/dL (ref 65–99)

## 2015-04-22 LAB — PHOSPHORUS: PHOSPHORUS: 3.2 mg/dL (ref 2.5–4.6)

## 2015-04-22 LAB — URINE MICROSCOPIC-ADD ON

## 2015-04-22 LAB — MRSA PCR SCREENING: MRSA BY PCR: NEGATIVE

## 2015-04-22 LAB — TROPONIN I
TROPONIN I: 0.05 ng/mL — AB (ref <0.031)
TROPONIN I: 0.07 ng/mL — AB (ref <0.031)

## 2015-04-22 LAB — MAGNESIUM: Magnesium: 3.3 mg/dL — ABNORMAL HIGH (ref 1.7–2.4)

## 2015-04-22 MED ORDER — POTASSIUM CHLORIDE 10 MEQ/100ML IV SOLN: 10.0000 meq | INTRAVENOUS | Status: DC

## 2015-04-22 MED ORDER — SODIUM CHLORIDE 0.9 % IV SOLN
INTRAVENOUS | Status: DC
  Administered 2015-04-22: 13:00:00 via INTRAVENOUS

## 2015-04-22 MED ORDER — MAGIC MOUTHWASH
5.0000 mL | Freq: Three times a day (TID) | ORAL | Status: DC
  Administered 2015-04-22 – 2015-04-25 (×9): 5 mL via ORAL
  Filled 2015-04-22 (×7): qty 5

## 2015-04-22 MED ORDER — PNEUMOCOCCAL VAC POLYVALENT 25 MCG/0.5ML IJ INJ
0.5000 mL | INJECTION | INTRAMUSCULAR | Status: AC
  Administered 2015-04-24: 0.5 mL via INTRAMUSCULAR
  Filled 2015-04-22: qty 0.5

## 2015-04-22 MED ORDER — BISACODYL 10 MG RE SUPP: 10.0000 mg | Freq: Every day | RECTAL | Status: DC | PRN

## 2015-04-22 MED ORDER — SODIUM CHLORIDE 0.9 % IV SOLN: INTRAVENOUS | Status: AC

## 2015-04-22 MED ORDER — SODIUM CHLORIDE 0.9 % IV SOLN
1.0000 g | Freq: Once | INTRAVENOUS | Status: AC
  Administered 2015-04-22: 1 g via INTRAVENOUS
  Filled 2015-04-22: qty 10

## 2015-04-22 MED ORDER — DEXTROSE-NACL 5-0.45 % IV SOLN
INTRAVENOUS | Status: DC
Start: 2015-04-22 — End: 2015-04-22

## 2015-04-22 MED ORDER — IRBESARTAN 300 MG PO TABS
300.0000 mg | ORAL_TABLET | Freq: Every day | ORAL | Status: DC
  Filled 2015-04-22: qty 1

## 2015-04-22 MED ORDER — INSULIN REGULAR HUMAN 100 UNIT/ML IJ SOLN
INTRAMUSCULAR | Status: DC
  Administered 2015-04-22: 6.2 [IU]/h via INTRAVENOUS
  Filled 2015-04-22: qty 2.5

## 2015-04-22 MED ORDER — DEXTROSE-NACL 5-0.45 % IV SOLN: INTRAVENOUS | Status: DC

## 2015-04-22 MED ORDER — SODIUM CHLORIDE 0.9 % IV SOLN
INTRAVENOUS | Status: DC
  Administered 2015-04-22: 150 mL/h via INTRAVENOUS

## 2015-04-22 MED ORDER — ENOXAPARIN SODIUM 30 MG/0.3ML ~~LOC~~ SOLN
30.0000 mg | SUBCUTANEOUS | Status: DC
  Administered 2015-04-24: 30 mg via SUBCUTANEOUS
  Filled 2015-04-22: qty 0.3

## 2015-04-22 MED ORDER — SODIUM CHLORIDE 0.9 % IV SOLN
INTRAVENOUS | Status: DC
  Administered 2015-04-22: 2 [IU]/h via INTRAVENOUS
  Filled 2015-04-22: qty 2.5

## 2015-04-22 MED ORDER — DEXTROSE 5 % IV SOLN
INTRAVENOUS | Status: DC
  Administered 2015-04-22: 22:00:00 via INTRAVENOUS

## 2015-04-22 MED ORDER — SODIUM CHLORIDE 0.9 % IV BOLUS (SEPSIS)
1000.0000 mL | Freq: Once | INTRAVENOUS | Status: AC
  Administered 2015-04-22: 1000 mL via INTRAVENOUS

## 2015-04-22 MED ORDER — ONDANSETRON HCL 4 MG/2ML IJ SOLN: 4.0000 mg | Freq: Four times a day (QID) | INTRAMUSCULAR | Status: DC | PRN

## 2015-04-22 MED ORDER — SODIUM CHLORIDE 0.9 % IV SOLN: INTRAVENOUS | Status: DC

## 2015-04-22 MED ORDER — OLMESARTAN MEDOXOMIL-HCTZ 40-12.5 MG PO TABS: 1.0000 | ORAL_TABLET | Freq: Every day | ORAL | Status: DC

## 2015-04-22 MED ORDER — ONDANSETRON HCL 4 MG PO TABS: 4.0000 mg | ORAL_TABLET | Freq: Four times a day (QID) | ORAL | Status: DC | PRN

## 2015-04-22 MED ORDER — HYDROCHLOROTHIAZIDE 12.5 MG PO CAPS: 12.5000 mg | ORAL_CAPSULE | Freq: Every day | ORAL | Status: DC

## 2015-04-22 MED ORDER — LIVING WELL WITH DIABETES BOOK
Freq: Once | Status: AC
Start: 2015-04-22 — End: 2015-04-22
  Filled 2015-04-22: qty 1

## 2015-04-22 MED ORDER — SODIUM CHLORIDE 0.9 % IV BOLUS (SEPSIS)
2000.0000 mL | Freq: Once | INTRAVENOUS | Status: AC
  Administered 2015-04-22: 2000 mL via INTRAVENOUS

## 2015-04-22 MED ORDER — ROSUVASTATIN CALCIUM 20 MG PO TABS
20.0000 mg | ORAL_TABLET | Freq: Every day | ORAL | Status: DC
  Administered 2015-04-23 – 2015-04-26 (×4): 20 mg via ORAL
  Filled 2015-04-22 (×4): qty 1

## 2015-04-22 MED ORDER — METOPROLOL TARTRATE 1 MG/ML IV SOLN
5.0000 mg | Freq: Four times a day (QID) | INTRAVENOUS | Status: DC | PRN
  Administered 2015-04-22 – 2015-04-24 (×4): 5 mg via INTRAVENOUS
  Filled 2015-04-22 (×4): qty 5

## 2015-04-22 MED ORDER — INSULIN STARTER KIT- SYRINGES (ENGLISH)
1.0000 | Freq: Once | Status: DC
  Filled 2015-04-22: qty 1

## 2015-04-22 MED ORDER — INSULIN REGULAR HUMAN 100 UNIT/ML IJ SOLN
INTRAMUSCULAR | Status: DC
  Administered 2015-04-22: 5.4 [IU]/h via INTRAVENOUS
  Filled 2015-04-22: qty 2.5

## 2015-04-22 MED ORDER — METOPROLOL TARTRATE 1 MG/ML IV SOLN: 5.0000 mg | Freq: Four times a day (QID) | INTRAVENOUS | Status: DC | PRN

## 2015-04-22 NOTE — ED Notes (Signed)
Pt's CBG result was 534. Informed [place] - Therapist, sports.

## 2015-04-22 NOTE — Progress Notes (Signed)
CRITICAL VALUE ALERT  Critical value received:  NA 162  Date of notification:  04/22/15  Time of notification:  [id number]  Critical value read back:Yes.    Nurse who received alert:  Gevena Cotton, RN  MD notified (1st page):  MD Short  Time of first page:  1727  MD notified (2nd page):  Time of second page:  Responding MD:    Time MD responded:

## 2015-04-22 NOTE — ED Notes (Signed)
Pt here today with high blood sugar, aloc and tachycardia , pt has had flu like symptoms for about a week , pt is not a known diabetic

## 2015-04-22 NOTE — ED Notes (Signed)
Attempted report 

## 2015-04-22 NOTE — Progress Notes (Signed)
critical sodium called of 167 NP called new orders given will continue to monitor

## 2015-04-22 NOTE — H&P (Signed)
[facility] History and Physical  Alec Jacobson [id number] DOB: [birth year] DOA: 04/22/2015  Referring physician:  PCP: Leola Brazil, MD   Chief Complaint: Diabetic Ketoacidosis  HPI: Alec Jacobson is a 70 y.o. male with a history of HTN And CKD, presenting with altered mental status, confusion. Spouse reports 1 week history of increased fatigue, polydipsia and polyuria and polyphagia. He had subjective fevers. Malaise appeared after upper respiratory infection about 10 days ago, for what he took over the counter decongestants and cough medicine with improvement of symptoms. No other recent illnesses. On presentation, he was confused, unable to engage in conversation. No shortness of breath, or chest pain. No abdominal pain. No peripheral edema. He continued to have polyuria. No history of recreational drugs. No history of MI, CVA or pancreatitis.family history of diabetes.  At the ED  He was tachycardic without fever. BP was elevated. CMET was remarkable for elevated glucose at 983, anion gap of 23. He was hypernatremic, Na 152. K was 5.9.  Bun 78 Creat 3.38. LFTs unremarkable. CXR negative. He received Ca gluconate, insulin and aggressive hydration.  His mental status is slowly improving.   Review of Systems:  Constitutional:  No weight loss, night sweats, +subjective fevers, +recent chills but now resolved, fatigue for about 1 week.  HEENT:  No headaches, Difficulty swallowing, Tooth/dental problems, Sore throat, No sneezing, itching, ear ache, no recent nasal congestion, post nasal drip Cardio-vascular:  No chest pain, Orthopnea, PND, swelling in lower extremities, anasarca, dizziness, palpitations  GI:  No heartburn, indigestion, abdominal pain, nausea, vomiting, diarrhea, change in bowel habits, + loss of appetite for about 1 week  Resp:  No shortness of breath with exertion or at rest. No excess mucus, no productive cough, No non-productive cough, No coughing up  of blood. No change in color of mucus .No wheezing. No chest wall deformity  Skin:  no rash or lesions.  GU:  no dysuria, change in color of urine, increased frequency for about 1 week. No flank pain. He has a history of prostate cancer with seed implant Musculoskeletal:   No joint pain or swelling. No decreased range of motion. No back pain.  Psych:  No change in mood or affect. No depression or anxiety. No memory loss.   Past Medical History  Diagnosis Date  . H/O allergic rhinitis     job related  . Hypertension   . Chronic kidney disease     over 30 yrs ago, had one stone  . [facility])     prostate   Past Surgical History  Procedure Laterality Date  . Nasal sinus surgery    . Tonsillectomy    . Sinus endo with fusion Bilateral 04/13/2014    Procedure: SINUS ENDO WITH FUSION;  Surgeon: Ruby Cola, MD;  Location: [facility] OR;  Service: ENT;  Laterality: Bilateral;   Social History:  reports that he has never smoked. He has never used smokeless tobacco. He reports that he does not drink alcohol or use illicit drugs.  Works for [employer], fixes houses.   No Known Allergies    Family History  Problem Relation Age of Onset  . Aneurysm Father 60    Prior to Admission medications   Medication Sig Start Date End Date Taking? Authorizing Provider  olmesartan-hydrochlorothiazide (BENICAR HCT) 40-12.5 MG per tablet Take 1 tablet by mouth daily.   Yes Historical Provider, MD  rosuvastatin (CRESTOR) 20 MG tablet Take 20 mg by mouth daily.  Yes Historical Provider, MD  cyclobenzaprine (FLEXERIL) 10 MG tablet Take 10 mg by mouth 3 (three) times daily as needed for muscle spasms.    Historical Provider, MD  Pseudoephedrine-Ibuprofen 30-200 MG TABS Take 1 tablet by mouth 2 (two) times daily as needed (sinus pressure).    Historical Provider, MD   Physical Exam: Filed Vitals:   04/22/15 0930 04/22/15 1000 04/22/15 1015 04/22/15 1045  BP: 166/108 180/108 161/110 170/115    Pulse: 114 112 114 115  Temp:      TempSrc:      Resp: 26 27 25 30   SpO2: 99% 98% 95% 94%    Wt Readings from Last 3 Encounters:  04/13/14 97.977 kg (216 lb)  04/11/14 98.34 kg (216 lb 12.8 oz)    General:  Appears agitated, confusion improving Eyes: PERRL, normal lids, irises & conjunctiva ENT: grossly normal hearing, lips & tongue with thrush  Neck: no LAD, masses or thyromegaly Cardiovascular: RRR, no m/r/g. No LE edema. Telemetry: SR, no arrhythmias  Respiratory: CTA bilaterally, no w/r/r. Normal respiratory effort. Abdomen: soft, ntnd Skin: no rash or induration seen on limited exam Musculoskeletal: grossly normal tone BUE/BLE Psychiatric: grossly normal mood and affect, speech fluent and appropriate Neurologic: able to follow simple commands. He is confused, cannot engage in conversation at this time          Labs on Admission:  Basic Metabolic Panel:  Recent Labs Lab 04/22/15 1017  NA 152*  K 5.9*  CL 112*  CO2 17*  GLUCOSE 983*  BUN 78*  CREATININE 3.38*  CALCIUM 10.2   Liver Function Tests:  Recent Labs Lab 04/22/15 1017  AST 19  ALT 26  ALKPHOS 141*  BILITOT 1.0  PROT 7.2  ALBUMIN 3.4*  CBC:  Recent Labs Lab 04/22/15 0921  WBC 13.2*  NEUTROABS 11.5*  HGB 17.4*  HCT 54.5*  MCV 83.5  PLT 220    CBG:  Recent Labs Lab 04/22/15 0848 04/22/15 1053  GLUCAP >600* >600*    Radiological Exams on Admission: Dg Chest Port 1 View  04/22/2015  CLINICAL DATA:  High blood sugar. Altered level of consciousness. Tachycardia. Flu-like symptoms for the past week. EXAM: PORTABLE CHEST 1 VIEW COMPARISON:  04/21/2008; 03/02/2007 FINDINGS: Grossly unchanged borderline enlarged cardiac silhouette and mediastinal contours given persistently reduced lung volumes. Grossly unchanged perihilar heterogeneous opacities favored to represent atelectasis. No new focal airspace opacities. No pleural effusion or pneumothorax. No evidence of edema. No acute osseus  abnormalities. IMPRESSION: No acute cardiopulmonary disease on this hypoventilated AP portable examination. Electronically Signed   By: Sandi Mariscal M.D.   On: 04/22/2015 09:35    EKG: normal sinus rhythm.  Assessment/Plan Principal Problem:   Diabetes mellitus, new onset ([place]) Active Problems:   Hypertension   Hyperlipidemia  Diabetic Ketoacidosis (DKA Continued to vigorously hydrate with normal saline. Will get 2 l bolus now Insulin drip per glucose stabilizer protocol with every hour CBG checks and every 4 hour BMET checks until stable to avoid hypokalemia. Diabetes coordinator and dietician consultations. Nothing by mouth for now with sips of water with meds.   Hypertension.  Continue Benicar Monitor closely  Chronic kidney disease Creatinine 3.38 today Monitor intake and output Continue to check periodically  Hyperlipidemia Continue Crestor  Thrush Oral care and MAgic Mouthwash   Code Status:  Full Code   DVT Prophylaxis: On Lovenox  Family Communication:  Wife at bedside Disposition Plan: [place] [facility] Pager [phone]-

## 2015-04-22 NOTE — ED Provider Notes (Signed)
CSN: [id number]     Arrival date & time 04/22/15  0841 History   First MD Initiated Contact with Patient 04/22/15 [phone]     Chief Complaint  Patient presents with  . Hyperglycemia  . Altered Mental Status     (Consider location/radiation/quality/duration/timing/severity/associated sxs/prior Treatment) Patient is a 70 y.o. male presenting with hyperglycemia and altered mental status. The history is provided by the patient, the EMS personnel and the spouse.  Hyperglycemia Associated symptoms: altered mental status, confusion, fatigue and fever   Altered Mental Status Presenting symptoms: confusion   Associated symptoms: fever    family reports the patient had not been feeling well for about a week. Feeling tired fatigue did have increased urination. They thought he had a touch of the flu. But there was no cough congestion nausea vomiting or diarrhea. Patient did have the feeling of a fever earlier in the week. Patient does not have a history of diabetes. Patient today was very confused. The contacted EMS to bring him in for evaluation.  Past Medical History  Diagnosis Date  . H/O allergic rhinitis     job related  . Hypertension   . Chronic kidney disease     over 30 yrs ago, had one stone  . Cancer [facility])     prostate   Past Surgical History  Procedure Laterality Date  . Nasal sinus surgery    . Tonsillectomy    . Sinus endo with fusion Bilateral 04/13/2014    Procedure: SINUS ENDO WITH FUSION;  Surgeon: Ruby Cola, MD;  Location: [facility] OR;  Service: ENT;  Laterality: Bilateral;   History reviewed. No pertinent family history. Social History  Substance Use Topics  . Smoking status: Never Smoker   . Smokeless tobacco: Never Used  . Alcohol Use: No    Review of Systems  Unable to perform ROS: Mental status change  Constitutional: Positive for fever and fatigue.  HENT: Negative for congestion.   Psychiatric/Behavioral: Positive for confusion.      Allergies  Review of  patient's allergies indicates no known allergies.  Home Medications   Prior to Admission medications   Medication Sig Start Date End Date Taking? Authorizing Provider  olmesartan-hydrochlorothiazide (BENICAR HCT) 40-12.5 MG per tablet Take 1 tablet by mouth daily.   Yes Historical Provider, MD  rosuvastatin (CRESTOR) 20 MG tablet Take 20 mg by mouth daily.   Yes Historical Provider, MD  cyclobenzaprine (FLEXERIL) 10 MG tablet Take 10 mg by mouth 3 (three) times daily as needed for muscle spasms.    Historical Provider, MD  Pseudoephedrine-Ibuprofen 30-200 MG TABS Take 1 tablet by mouth 2 (two) times daily as needed (sinus pressure).    Historical Provider, MD   BP 170/115 mmHg  Pulse 115  Temp(Src) 97.2 F (36.2 C) (Oral)  Resp 30  SpO2 94% Physical Exam  Constitutional: He appears well-developed and well-nourished. He appears distressed.  HENT:  Head: Normocephalic and atraumatic.  Mucous membranes very dry.  Eyes: Conjunctivae and EOM are normal. Pupils are equal, round, and reactive to light.  Neck: Normal range of motion.  Cardiovascular: Normal rate, regular rhythm and normal heart sounds.   No murmur heard. Pulmonary/Chest: Effort normal and breath sounds normal. No respiratory distress.  Abdominal: Soft. Bowel sounds are normal. There is no tenderness.  Musculoskeletal: Normal range of motion.  Neurological: He is alert. No cranial nerve deficit. He exhibits normal muscle tone. Coordination normal.  Drowsy but will awaken will follow commands. No obvious focal neuro deficit.  Moving all 4 extremities on command.  Skin: Skin is warm. No rash noted.  Nursing note and vitals reviewed.   ED Course  Procedures (including critical care time) Labs Review Labs Reviewed  CBC WITH DIFFERENTIAL/PLATELET - Abnormal; Notable for the following:    WBC 13.2 (*)    RBC 6.53 (*)    Hemoglobin 17.4 (*)    HCT 54.5 (*)    RDW 18.6 (*)    Neutro Abs 11.5 (*)    Lymphs Abs 0.6 (*)     Monocytes Absolute 1.1 (*)    All other components within normal limits  URINALYSIS, ROUTINE W REFLEX MICROSCOPIC (NOT AT [facility]) - Abnormal; Notable for the following:    APPearance CLOUDY (*)    Specific Gravity, Urine 1.034 (*)    Glucose, UA >1000 (*)    Hgb urine dipstick MODERATE (*)    Bilirubin Urine SMALL (*)    Ketones, ur 15 (*)    Protein, ur 30 (*)    All other components within normal limits  COMPREHENSIVE METABOLIC PANEL - Abnormal; Notable for the following:    Sodium 152 (*)    Potassium 5.9 (*)    Chloride 112 (*)    CO2 17 (*)    Glucose, Bld 983 (*)    BUN 78 (*)    Creatinine, Ser 3.38 (*)    Albumin 3.4 (*)    Alkaline Phosphatase 141 (*)    GFR calc non Af Amer 17 (*)    GFR calc Af Amer 20 (*)    Anion gap 23 (*)    All other components within normal limits  URINE MICROSCOPIC-ADD ON - Abnormal; Notable for the following:    Squamous Epithelial / LPF 0-5 (*)    Bacteria, UA RARE (*)    Casts HYALINE CASTS (*)    All other components within normal limits  CBG MONITORING, ED - Abnormal; Notable for the following:    Glucose-Capillary >600 (*)    All other components within normal limits  CBG MONITORING, ED - Abnormal; Notable for the following:    Glucose-Capillary >600 (*)    All other components within normal limits  I-STAT CHEM 8, ED   Results for orders placed or performed during the hospital encounter of 04/22/15  CBC with Differential/Platelet  Result Value Ref Range   WBC 13.2 (H) 4.0 - 10.5 K/uL   RBC 6.53 (H) 4.22 - 5.81 MIL/uL   Hemoglobin 17.4 (H) 13.0 - 17.0 g/dL   HCT 54.5 (H) 39.0 - 52.0 %   MCV 83.5 78.0 - 100.0 fL   MCH 26.6 26.0 - 34.0 pg   MCHC 31.9 30.0 - 36.0 g/dL   RDW 18.6 (H) 11.5 - 15.5 %   Platelets 220 150 - 400 K/uL   Neutrophils Relative % 87 %   Neutro Abs 11.5 (H) 1.7 - 7.7 K/uL   Lymphocytes Relative 5 %   Lymphs Abs 0.6 (L) 0.7 - 4.0 K/uL   Monocytes Relative 8 %   Monocytes Absolute 1.1 (H) 0.1 - 1.0 K/uL    Eosinophils Relative 0 %   Eosinophils Absolute 0.0 0.0 - 0.7 K/uL   Basophils Relative 0 %   Basophils Absolute 0.0 0.0 - 0.1 K/uL  Urinalysis, Routine w reflex microscopic (not at [facility])  Result Value Ref Range   Color, Urine YELLOW YELLOW   APPearance CLOUDY (A) CLEAR   Specific Gravity, Urine 1.034 (H) 1.005 - 1.030   pH 5.0 5.0 -  8.0   Glucose, UA >1000 (A) NEGATIVE mg/dL   Hgb urine dipstick MODERATE (A) NEGATIVE   Bilirubin Urine SMALL (A) NEGATIVE   Ketones, ur 15 (A) NEGATIVE mg/dL   Protein, ur 30 (A) NEGATIVE mg/dL   Nitrite NEGATIVE NEGATIVE   Leukocytes, UA NEGATIVE NEGATIVE  Comprehensive metabolic panel  Result Value Ref Range   Sodium 152 (H) 135 - 145 mmol/L   Potassium 5.9 (H) 3.5 - 5.1 mmol/L   Chloride 112 (H) 101 - 111 mmol/L   CO2 17 (L) 22 - 32 mmol/L   Glucose, Bld 983 (HH) 65 - 99 mg/dL   BUN 78 (H) 6 - 20 mg/dL   Creatinine, Ser 3.38 (H) 0.61 - 1.24 mg/dL   Calcium 10.2 8.9 - 10.3 mg/dL   Total Protein 7.2 6.5 - 8.1 g/dL   Albumin 3.4 (L) 3.5 - 5.0 g/dL   AST 19 15 - 41 U/L   ALT 26 17 - 63 U/L   Alkaline Phosphatase 141 (H) 38 - 126 U/L   Total Bilirubin 1.0 0.3 - 1.2 mg/dL   GFR calc non Af Amer 17 (L) >60 mL/min   GFR calc Af Amer 20 (L) >60 mL/min   Anion gap 23 (H) 5 - 15  Urine microscopic-add on  Result Value Ref Range   Squamous Epithelial / LPF 0-5 (A) NONE SEEN   WBC, UA 0-5 0 - 5 WBC/hpf   RBC / HPF 0-5 0 - 5 RBC/hpf   Bacteria, UA RARE (A) NONE SEEN   Casts HYALINE CASTS (A) NEGATIVE  CBG monitoring, ED  Result Value Ref Range   Glucose-Capillary >600 (HH) 65 - 99 mg/dL  CBG monitoring, ED  Result Value Ref Range   Glucose-Capillary >600 (HH) 65 - 99 mg/dL     Imaging Review Dg Chest Port 1 View  04/22/2015  CLINICAL DATA:  High blood sugar. Altered level of consciousness. Tachycardia. Flu-like symptoms for the past week. EXAM: PORTABLE CHEST 1 VIEW COMPARISON:  04/21/2008; 03/02/2007 FINDINGS: Grossly unchanged borderline  enlarged cardiac silhouette and mediastinal contours given persistently reduced lung volumes. Grossly unchanged perihilar heterogeneous opacities favored to represent atelectasis. No new focal airspace opacities. No pleural effusion or pneumothorax. No evidence of edema. No acute osseus abnormalities. IMPRESSION: No acute cardiopulmonary disease on this hypoventilated AP portable examination. Electronically Signed   By: Sandi Mariscal M.D.   On: 04/22/2015 09:35   I have personally reviewed and evaluated these images and lab results as part of my medical decision-making.   EKG Interpretation   Date/Time:  Saturday April 22 2015 08:47:01 EST Ventricular Rate:  118 PR Interval:  136 QRS Duration: 85 QT Interval:  319 QTC Calculation: 447 R Axis:   -76 Text Interpretation:  Sinus tachycardia Left anterior fascicular block  Abnormal R-wave progression, late transition Confirmed by Teagen Bucio  MD,  Miran Kautzman [id number]) on 04/22/2015 8:50:44 AM       CRITICAL CARE Performed by: Fredia Sorrow Total critical care time: 30 minutes Critical care time was exclusive of separately billable procedures and treating other patients. Critical care was necessary to treat or prevent imminent or life-threatening deterioration. Critical care was time spent personally by me on the following activities: development of treatment plan with patient and/or surrogate as well as nursing, discussions with consultants, evaluation of patient's response to treatment, examination of patient, obtaining history from patient or surrogate, ordering and performing treatments and interventions, ordering and review of laboratory studies, ordering and review of radiographic studies, pulse oximetry  and re-evaluation of patient's condition.    MDM   Final diagnoses:  Hyperglycemia  Hyperkalemia  Hypernatremia  Altered mental status, unspecified altered mental status type  Diabetic ketoacidosis without coma associated with other  specified diabetes mellitus ([place])    Patient with previously unknown history of diabetes presenting with significant hyperglycemia severe dehydration hypernatremia hyperkalemia responding well to fluids and glucose stabilizer. Patient given calcium gluconate to protect the heart. EKG with the evidence of a left anterior fascicular block some mild T-wave peaking. QRS duration however is normal.  Suspect patient's the diminished mental status although he is alert and will follow commands he is drowsy is most likely due to probably significantly elevated sodium.  We'll discuss with the triad hospitalist for admission.    Fredia Sorrow, MD 04/22/15 1119

## 2015-04-23 DIAGNOSIS — B370 Candidal stomatitis: Secondary | ICD-10-CM

## 2015-04-23 DIAGNOSIS — E119 Type 2 diabetes mellitus without complications: Secondary | ICD-10-CM

## 2015-04-23 DIAGNOSIS — I10 Essential (primary) hypertension: Secondary | ICD-10-CM

## 2015-04-23 LAB — GLUCOSE, CAPILLARY
GLUCOSE-CAPILLARY: 106 mg/dL — AB (ref 65–99)
GLUCOSE-CAPILLARY: 131 mg/dL — AB (ref 65–99)
GLUCOSE-CAPILLARY: 137 mg/dL — AB (ref 65–99)
GLUCOSE-CAPILLARY: 143 mg/dL — AB (ref 65–99)
GLUCOSE-CAPILLARY: 156 mg/dL — AB (ref 65–99)
GLUCOSE-CAPILLARY: 164 mg/dL — AB (ref 65–99)
GLUCOSE-CAPILLARY: 181 mg/dL — AB (ref 65–99)
GLUCOSE-CAPILLARY: 183 mg/dL — AB (ref 65–99)
GLUCOSE-CAPILLARY: 197 mg/dL — AB (ref 65–99)
GLUCOSE-CAPILLARY: 209 mg/dL — AB (ref 65–99)
GLUCOSE-CAPILLARY: 221 mg/dL — AB (ref 65–99)
Glucose-Capillary: 118 mg/dL — ABNORMAL HIGH (ref 65–99)
Glucose-Capillary: 139 mg/dL — ABNORMAL HIGH (ref 65–99)
Glucose-Capillary: 118 mg/dL — ABNORMAL HIGH (ref 65–99)
Glucose-Capillary: 196 mg/dL — ABNORMAL HIGH (ref 65–99)
Glucose-Capillary: 198 mg/dL — ABNORMAL HIGH (ref 65–99)
Glucose-Capillary: 231 mg/dL — ABNORMAL HIGH (ref 65–99)

## 2015-04-23 LAB — BASIC METABOLIC PANEL
Anion gap: 11 (ref 5–15)
Anion gap: 13 (ref 5–15)
Anion gap: 13 (ref 5–15)
BUN: 67 mg/dL — ABNORMAL HIGH (ref 6–20)
BUN: 64 mg/dL — AB (ref 6–20)
BUN: 64 mg/dL — AB (ref 6–20)
BUN: 61 mg/dL — AB (ref 6–20)
BUN: 52 mg/dL — AB (ref 6–20)
BUN: 53 mg/dL — AB (ref 6–20)
CALCIUM: 9.2 mg/dL (ref 8.9–10.3)
CALCIUM: 8.5 mg/dL — AB (ref 8.9–10.3)
CALCIUM: 8.8 mg/dL — AB (ref 8.9–10.3)
CALCIUM: 8 mg/dL — AB (ref 8.9–10.3)
CO2: 23 mmol/L (ref 22–32)
CO2: 22 mmol/L (ref 22–32)
CO2: 19 mmol/L — ABNORMAL LOW (ref 22–32)
CO2: 20 mmol/L — ABNORMAL LOW (ref 22–32)
CO2: 22 mmol/L (ref 22–32)
CO2: 22 mmol/L (ref 22–32)
CREATININE: 2.37 mg/dL — AB (ref 0.61–1.24)
CREATININE: 2.35 mg/dL — AB (ref 0.61–1.24)
CREATININE: 2.24 mg/dL — AB (ref 0.61–1.24)
CREATININE: 2.23 mg/dL — AB (ref 0.61–1.24)
Calcium: 9.1 mg/dL (ref 8.9–10.3)
Calcium: 8.9 mg/dL (ref 8.9–10.3)
Chloride: >130 mmol/L (ref 101–111)
Chloride: 128 mmol/L — ABNORMAL HIGH (ref 101–111)
Chloride: 123 mmol/L — ABNORMAL HIGH (ref 101–111)
Chloride: 119 mmol/L — ABNORMAL HIGH (ref 101–111)
Creatinine, Ser: 2.38 mg/dL — ABNORMAL HIGH (ref 0.61–1.24)
Creatinine, Ser: 2.47 mg/dL — ABNORMAL HIGH (ref 0.61–1.24)
GFR calc Af Amer: 31 mL/min — ABNORMAL LOW (ref >60)
GFR calc Af Amer: 31 mL/min — ABNORMAL LOW (ref >60)
GFR calc Af Amer: 29 mL/min — ABNORMAL LOW (ref >60)
GFR calc Af Amer: 33 mL/min — ABNORMAL LOW (ref >60)
GFR calc Af Amer: 33 mL/min — ABNORMAL LOW (ref >60)
GFR calc non Af Amer: 26 mL/min — ABNORMAL LOW (ref >60)
GFR calc non Af Amer: 27 mL/min — ABNORMAL LOW (ref >60)
GFR, EST AFRICAN AMERICAN: 31 mL/min — AB (ref >60)
GFR, EST NON AFRICAN AMERICAN: 27 mL/min — AB (ref >60)
GFR, EST NON AFRICAN AMERICAN: 25 mL/min — AB (ref >60)
GFR, EST NON AFRICAN AMERICAN: 28 mL/min — AB (ref >60)
GFR, EST NON AFRICAN AMERICAN: 29 mL/min — AB (ref >60)
GLUCOSE: 156 mg/dL — AB (ref 65–99)
GLUCOSE: 215 mg/dL — AB (ref 65–99)
GLUCOSE: 250 mg/dL — AB (ref 65–99)
Glucose, Bld: 156 mg/dL — ABNORMAL HIGH (ref 65–99)
Glucose, Bld: 253 mg/dL — ABNORMAL HIGH (ref 65–99)
Glucose, Bld: 88 mg/dL (ref 65–99)
POTASSIUM: 3.8 mmol/L (ref 3.5–5.1)
POTASSIUM: 3.7 mmol/L (ref 3.5–5.1)
POTASSIUM: 3.6 mmol/L (ref 3.5–5.1)
POTASSIUM: 3.1 mmol/L — AB (ref 3.5–5.1)
POTASSIUM: 3.7 mmol/L (ref 3.5–5.1)
Potassium: 3.5 mmol/L (ref 3.5–5.1)
SODIUM: 165 mmol/L — AB (ref 135–145)
SODIUM: 164 mmol/L — AB (ref 135–145)
SODIUM: 163 mmol/L — AB (ref 135–145)
SODIUM: 159 mmol/L — AB (ref 135–145)
SODIUM: 158 mmol/L — AB (ref 135–145)
SODIUM: 154 mmol/L — AB (ref 135–145)

## 2015-04-23 LAB — CBC
HCT: 44.7 % (ref 39.0–52.0)
Hemoglobin: 14 g/dL (ref 13.0–17.0)
MCH: 25.8 pg — ABNORMAL LOW (ref 26.0–34.0)
MCHC: 31.3 g/dL (ref 30.0–36.0)
MCV: 82.3 fL (ref 78.0–100.0)
PLATELETS: 77 10*3/uL — AB (ref 150–400)
RBC: 5.43 MIL/uL (ref 4.22–5.81)
RDW: 17.8 % — AB (ref 11.5–15.5)
WBC: 8 10*3/uL (ref 4.0–10.5)

## 2015-04-23 LAB — TROPONIN I
TROPONIN I: 0.15 ng/mL — AB (ref <0.031)
Troponin I: 0.1 ng/mL — ABNORMAL HIGH (ref <0.031)
Troponin I: 0.15 ng/mL — ABNORMAL HIGH (ref <0.031)
Troponin I: 0.14 ng/mL — ABNORMAL HIGH (ref <0.031)

## 2015-04-23 MED ORDER — INSULIN ASPART 100 UNIT/ML ~~LOC~~ SOLN
0.0000 [IU] | Freq: Every day | SUBCUTANEOUS | Status: DC
  Administered 2015-04-24 – 2015-04-25 (×2): 2 [IU] via SUBCUTANEOUS

## 2015-04-23 MED ORDER — INSULIN GLARGINE 100 UNIT/ML ~~LOC~~ SOLN
17.0000 [IU] | Freq: Every day | SUBCUTANEOUS | Status: DC
  Administered 2015-04-23: 17 [IU] via SUBCUTANEOUS
  Filled 2015-04-23 (×3): qty 0.17

## 2015-04-23 MED ORDER — SODIUM CHLORIDE 0.45 % IV SOLN
INTRAVENOUS | Status: DC
  Administered 2015-04-23: 75 mL/h via INTRAVENOUS
  Administered 2015-04-24 – 2015-04-25 (×3): via INTRAVENOUS

## 2015-04-23 MED ORDER — INSULIN ASPART 100 UNIT/ML ~~LOC~~ SOLN
0.0000 [IU] | Freq: Three times a day (TID) | SUBCUTANEOUS | Status: DC
  Administered 2015-04-24 (×2): 8 [IU] via SUBCUTANEOUS
  Administered 2015-04-24: 11 [IU] via SUBCUTANEOUS
  Administered 2015-04-25 (×3): 5 [IU] via SUBCUTANEOUS
  Administered 2015-04-26: 2 [IU] via SUBCUTANEOUS

## 2015-04-23 NOTE — Progress Notes (Signed)
Inpatient Diabetes Program Recommendations  AACE/ADA: New Consensus Statement on Inpatient Glycemic Control (2015)  Target Ranges:  Prepandial:   less than 140 mg/dL      Peak postprandial:   less than 180 mg/dL (1-2 hours)      Critically ill patients:  140 - 180 mg/dL  Results for Alec Jacobson, Alec Jacobson (MRN [id number]) as of 04/23/2015 09:00  Ref. Range 04/23/2015 00:55 04/23/2015 01:59 04/23/2015 03:04 04/23/2015 04:10 04/23/2015 05:14 04/23/2015 06:20 04/23/2015 07:21  Glucose-Capillary Latest Ref Range: 65-99 mg/dL 143 (H) 118 (H) 131 (H) 197 (H) 221 (H) 139 (H) 118 (H)   Review of Glycemic Control  Diabetes history: No Outpatient Diabetes medications: NA Current orders for Inpatient glycemic control: Phase 1 of DKA protocol; Novolin R insulin drip  Inpatient Diabetes Program Recommendations Insulin - IV drip/GlucoStabilizer: According to the lab results and glucose results DKA appears to be resolved at this time. Therefore, recommend transitioning patient to SQ insulin per Phase 2 of DKA order set per protocol. Insulin - Basal: At time of transition from IV to SQ insulin, please consider ordering Lantus 17 units Q24H (based on 87 kg x 0.2 units). Correction (SSI): At time of transition from IV to SQ insulin, please consider ordering Novolog sensitive correction Q4H. IV fluids: If patient is eating and tolerating PO, please consider re-evaluating need to Dextrose in IV fluids.  NURSING: Please note when DKA is resolved (as determined by MD when CO2 is 20 or greater, anion gap is normal, and ketones are negative) MD will initiate Phase 2 of DKA Protocol for transition off insulin drip.  Once transition orders are ordered,  RN should discontinue insulin drip 2 hours after subcutaneous basal insulin given and simultaneously give Novolog correction scale. BEDSIDE NURSING will need to educate patient on DM, CBG monitoring, and insulin administration. Please use each patient interaction to educate patient and  allow him to be actively engaged by allowing him to check his own glucose and administer his own insulin injections.  Thanks, Barnie Alderman, RN, MSN, CDE Diabetes Coordinator Inpatient Diabetes Program [phone] (Team Pager from [place] to [place]) [phone] ([facility] office) [phone] [facility] office) [phone] [facility] office)

## 2015-04-23 NOTE — Progress Notes (Signed)
CRITICAL VALUE ALERT  Critical value received:  Na 165 & CL >130   Date of notification:  04/23/15  Time of notification:  0815  Critical value read back:Yes.    Nurse who received alert:  Gevena Cotton, RN  MD notified (1st page):  Dr. Eliseo Squires  Time of first page:    MD notified (2nd page):  Time of second page:  Responding MD:  Dr. Eliseo Squires  Time MD responded:  [phone]

## 2015-04-23 NOTE — Progress Notes (Signed)
PROGRESS NOTE  Alec Jacobson [id number] DOB: 07/15/1945 DOA: 04/22/2015 PCP: Alec Brazil, MD  Assessment/Plan: HHS/HONK D5 Insulin drip per glucose stabilizer protocol with every hour CBG checks  -4 hour BMET checks until stable to avoid hypokalemia. -once eating- change to lantus 17 units daily and SSI  Hypernatremia -due to HHS  Hypertension.  PRN >170  AKI -baseline 1 Monitor intake and output BMP daily  Hyperlipidemia Continue Crestor  Thrush Oral care and Magic Mouthwash   Code Status: full Family Communication: no family at bedside- called wife Alec Jacobson Disposition Plan:    Consultants:  Diabetic coordinator  Procedures:      HPI/Subjective: Asking for drink  Objective: Filed Vitals:   04/23/15 0400 04/23/15 0730  BP: 152/107   Pulse:    Temp: 98.7 F (37.1 C) 98.9 F (37.2 C)  Resp: 27     Intake/Output Summary (Last 24 hours) at 04/23/15 1048 Last data filed at 04/23/15 0825  Gross per 24 hour  Intake 1065.35 ml  Output    525 ml  Net 540.35 ml   Filed Weights   04/22/15 1415  Weight: 87.7 kg (193 lb 5.5 oz)    Exam:   General:  Awake but confused and impulsive- sitter at bedside  Cardiovascular: rrr  Respiratory: clear  Abdomen: +BS, soft  Musculoskeletal: no edema   Data Reviewed: Basic Metabolic Panel:  Recent Labs Lab 04/22/15 1326 04/22/15 1645 04/22/15 2035 04/23/15 0131 04/23/15 0620  NA 158* 162* 167* 165* 164*  K 3.8 4.7 4.1 3.8 3.5  CL 122* 128* >130* >130* >130*  CO2 18* 20* _0 GLUCOSE 555* 372* 224* 156* 156*  BUN 71* 72* 68* 67* 64*  CREATININE 2.72*  2.71* 2.48* 2.41* 2.38* 2.37*  CALCIUM 10.2 10.0 9.4 9.2 9.1  MG 3.3*  --   --   --   --   PHOS 3.2  --   --   --   --    Liver Function Tests:  Recent Labs Lab 04/22/15 1017  AST 19  ALT 26  ALKPHOS 141*  BILITOT 1.0  PROT 7.2  ALBUMIN 3.4*   No results for input(s): LIPASE, AMYLASE in the last 168 hours. No  results for input(s): AMMONIA in the last 168 hours. CBC:  Recent Labs Lab 04/22/15 0921  WBC 13.2*  NEUTROABS 11.5*  HGB 17.4*  HCT 54.5*  MCV 83.5  PLT 220   Cardiac Enzymes:  Recent Labs Lab 04/22/15 1326 04/22/15 1830 04/23/15 0131  TROPONINI 0.05* 0.07* 0.10*   BNP (last 3 results) No results for input(s): BNP in the last 8760 hours.  ProBNP (last 3 results) No results for input(s): PROBNP in the last 8760 hours.  CBG:  Recent Labs Lab 04/23/15 0410 04/23/15 0514 04/23/15 0620 04/23/15 0721 04/23/15 0831  GLUCAP 197* 221* 139* 118* 156*    Recent Results (from the past 240 hour(s))  MRSA PCR Screening     Status: None   Collection Time: 04/22/15  3:17 PM  Result Value Ref Range Status   MRSA by PCR NEGATIVE NEGATIVE Final    Comment:        The GeneXpert MRSA Assay (FDA approved for NASAL specimens only), is one component of a comprehensive MRSA colonization surveillance program. It is not intended to diagnose MRSA infection nor to guide or monitor treatment for MRSA infections.      Studies: Dg Chest Port 1 View  04/22/2015  CLINICAL DATA:  High blood  sugar. Altered level of consciousness. Tachycardia. Flu-like symptoms for the past week. EXAM: PORTABLE CHEST 1 VIEW COMPARISON:  04/21/2008; 03/02/2007 FINDINGS: Grossly unchanged borderline enlarged cardiac silhouette and mediastinal contours given persistently reduced lung volumes. Grossly unchanged perihilar heterogeneous opacities favored to represent atelectasis. No new focal airspace opacities. No pleural effusion or pneumothorax. No evidence of edema. No acute osseus abnormalities. IMPRESSION: No acute cardiopulmonary disease on this hypoventilated AP portable examination. Electronically Signed   By: Sandi Mariscal M.D.   On: 04/22/2015 09:35    Scheduled Meds: . enoxaparin (LOVENOX) injection  30 mg Subcutaneous Q24H  . insulin starter kit- syringes  1 kit Other Once  . living well with  diabetes book   Does not apply Once  . magic mouthwash  5 mL Oral TID  . pneumococcal 23 valent vaccine  0.5 mL Intramuscular Tomorrow-1000  . rosuvastatin  20 mg Oral Daily   Continuous Infusions: . dextrose 125 mL/hr at 04/23/15 0500  . insulin (NOVOLIN-R) infusion 5.5 Units/hr (04/23/15 0410)   Antibiotics Given (last 72 hours)    None      Principal Problem:   Diabetes mellitus, new onset ([place]) Active Problems:   Hypertension   Hyperlipidemia   DKA (diabetic ketoacidoses) ([place])   Thrush    Time spent: 35 min    [place] [facility] Pager [phone] If 7PM-7AM, please contact night-coverage at [email or web address], password [facility] 04/23/2015, 10:48 AM  LOS: 1 day

## 2015-04-24 DIAGNOSIS — E1311 Other specified diabetes mellitus with ketoacidosis with coma: Secondary | ICD-10-CM

## 2015-04-24 DIAGNOSIS — E870 Hyperosmolality and hypernatremia: Secondary | ICD-10-CM

## 2015-04-24 LAB — BASIC METABOLIC PANEL
ANION GAP: 14 (ref 5–15)
Anion gap: 16 — ABNORMAL HIGH (ref 5–15)
Anion gap: 11 (ref 5–15)
BUN: 50 mg/dL — ABNORMAL HIGH (ref 6–20)
BUN: 47 mg/dL — ABNORMAL HIGH (ref 6–20)
BUN: 43 mg/dL — AB (ref 6–20)
CHLORIDE: 114 mmol/L — AB (ref 101–111)
CHLORIDE: 113 mmol/L — AB (ref 101–111)
CO2: 22 mmol/L (ref 22–32)
CO2: 22 mmol/L (ref 22–32)
CO2: 22 mmol/L (ref 22–32)
CREATININE: 2.06 mg/dL — AB (ref 0.61–1.24)
CREATININE: 1.92 mg/dL — AB (ref 0.61–1.24)
Calcium: 8.3 mg/dL — ABNORMAL LOW (ref 8.9–10.3)
Calcium: 7.9 mg/dL — ABNORMAL LOW (ref 8.9–10.3)
Calcium: 7.8 mg/dL — ABNORMAL LOW (ref 8.9–10.3)
Chloride: 113 mmol/L — ABNORMAL HIGH (ref 101–111)
Creatinine, Ser: 1.74 mg/dL — ABNORMAL HIGH (ref 0.61–1.24)
GFR calc Af Amer: 45 mL/min — ABNORMAL LOW (ref >60)
GFR calc non Af Amer: 31 mL/min — ABNORMAL LOW (ref >60)
GFR calc non Af Amer: 34 mL/min — ABNORMAL LOW (ref >60)
GFR, EST AFRICAN AMERICAN: 36 mL/min — AB (ref >60)
GFR, EST AFRICAN AMERICAN: 40 mL/min — AB (ref >60)
GFR, EST NON AFRICAN AMERICAN: 39 mL/min — AB (ref >60)
GLUCOSE: 381 mg/dL — AB (ref 65–99)
Glucose, Bld: 288 mg/dL — ABNORMAL HIGH (ref 65–99)
Glucose, Bld: 309 mg/dL — ABNORMAL HIGH (ref 65–99)
POTASSIUM: 3.7 mmol/L (ref 3.5–5.1)
Potassium: 3.8 mmol/L (ref 3.5–5.1)
Potassium: 4 mmol/L (ref 3.5–5.1)
Sodium: 152 mmol/L — ABNORMAL HIGH (ref 135–145)
Sodium: 149 mmol/L — ABNORMAL HIGH (ref 135–145)
Sodium: 146 mmol/L — ABNORMAL HIGH (ref 135–145)

## 2015-04-24 LAB — GASTROINTESTINAL PANEL BY PCR, STOOL (REPLACES STOOL CULTURE)
Adenovirus F40/41: NOT DETECTED
Astrovirus: NOT DETECTED
CRYPTOSPORIDIUM: NOT DETECTED
CYCLOSPORA CAYETANENSIS: NOT DETECTED
Campylobacter species: NOT DETECTED
E. COLI O157: NOT DETECTED
Entamoeba histolytica: NOT DETECTED
Enteroaggregative E coli (EAEC): NOT DETECTED
Enteropathogenic E coli (EPEC): NOT DETECTED
Enterotoxigenic E coli (ETEC): NOT DETECTED
Giardia lamblia: NOT DETECTED
Norovirus GI/GII: NOT DETECTED
Plesimonas shigelloides: NOT DETECTED
ROTAVIRUS A: NOT DETECTED
SALMONELLA SPECIES: NOT DETECTED
SAPOVIRUS (I, II, IV, AND V): NOT DETECTED
SHIGA LIKE TOXIN PRODUCING E COLI (STEC): NOT DETECTED
Shigella/Enteroinvasive E coli (EIEC): NOT DETECTED
VIBRIO SPECIES: NOT DETECTED
Vibrio cholerae: NOT DETECTED
YERSINIA ENTEROCOLITICA: NOT DETECTED

## 2015-04-24 LAB — GLUCOSE, CAPILLARY
GLUCOSE-CAPILLARY: 396 mg/dL — AB (ref 65–99)
GLUCOSE-CAPILLARY: 290 mg/dL — AB (ref 65–99)
Glucose-Capillary: 262 mg/dL — ABNORMAL HIGH (ref 65–99)
Glucose-Capillary: 320 mg/dL — ABNORMAL HIGH (ref 65–99)
Glucose-Capillary: 213 mg/dL — ABNORMAL HIGH (ref 65–99)
Glucose-Capillary: 234 mg/dL — ABNORMAL HIGH (ref 65–99)

## 2015-04-24 LAB — CBC WITH DIFFERENTIAL/PLATELET
Basophils Absolute: 0.1 10*3/uL (ref 0.0–0.1)
Basophils Relative: 1 %
EOS ABS: 0 10*3/uL (ref 0.0–0.7)
EOS PCT: 1 %
HCT: 40.9 % (ref 39.0–52.0)
Hemoglobin: 13.2 g/dL (ref 13.0–17.0)
LYMPHS ABS: 1.1 10*3/uL (ref 0.7–4.0)
LYMPHS PCT: 13 %
MCH: 25.8 pg — AB (ref 26.0–34.0)
MCHC: 32.3 g/dL (ref 30.0–36.0)
MCV: 79.9 fL (ref 78.0–100.0)
MONO ABS: 0.7 10*3/uL (ref 0.1–1.0)
Monocytes Relative: 9 %
NEUTROS ABS: 6.4 10*3/uL (ref 1.7–7.7)
NEUTROS PCT: 77 %
PLATELETS: 66 10*3/uL — AB (ref 150–400)
RBC: 5.12 MIL/uL (ref 4.22–5.81)
RDW: 17 % — AB (ref 11.5–15.5)
WBC: 8.3 10*3/uL (ref 4.0–10.5)

## 2015-04-24 LAB — HEMOGLOBIN A1C
Hgb A1c MFr Bld: 12.5 % — ABNORMAL HIGH (ref 4.8–5.6)
Mean Plasma Glucose: 312 mg/dL

## 2015-04-24 MED ORDER — ACETAMINOPHEN 325 MG PO TABS
650.0000 mg | ORAL_TABLET | Freq: Four times a day (QID) | ORAL | Status: DC | PRN
  Administered 2015-04-24: 650 mg via ORAL
  Filled 2015-04-24: qty 2

## 2015-04-24 MED ORDER — INSULIN GLARGINE 100 UNIT/ML ~~LOC~~ SOLN
20.0000 [IU] | Freq: Every day | SUBCUTANEOUS | Status: DC
  Administered 2015-04-24: 20 [IU] via SUBCUTANEOUS
  Filled 2015-04-24 (×3): qty 0.2

## 2015-04-24 MED ORDER — INSULIN ASPART 100 UNIT/ML ~~LOC~~ SOLN
5.0000 [IU] | Freq: Three times a day (TID) | SUBCUTANEOUS | Status: DC
  Administered 2015-04-24 – 2015-04-26 (×6): 5 [IU] via SUBCUTANEOUS

## 2015-04-24 MED ORDER — LIVING WELL WITH DIABETES BOOK
Freq: Once | Status: AC
  Administered 2015-04-24: 16:00:00
  Filled 2015-04-24 (×3): qty 1

## 2015-04-24 NOTE — Care Management Important Message (Signed)
Important Message  Patient Details  Name: Alec Jacobson MRN: [id number] Date of Birth: [birth year]   Medicare Important Message Given:  Yes    Zenon Mayo, RN 04/24/2015, 11:01 AMImportant Message  Patient Details  Name: Alec Jacobson MRN: [id number] Date of Birth: 08-01-1945   Medicare Important Message Given:  Yes    Zenon Mayo, RN 04/24/2015, 11:01 AM

## 2015-04-24 NOTE — Progress Notes (Signed)
Inpatient Diabetes Program Recommendations  AACE/ADA: New Consensus Statement on Inpatient Glycemic Control (2015)  Target Ranges:  Prepandial:   less than 140 mg/dL      Peak postprandial:   less than 180 mg/dL (1-2 hours)      Critically ill patients:  140 - 180 mg/dL   Results for AMARDEEP, BECKERS (MRN [id number]) as of 04/24/2015 12:36  Ref. Range 04/24/2015 08:12  Glucose-Capillary Latest Ref Range: 65-99 mg/dL 262 (H)   Results for STEPAN, VERRETTE (MRN [id number]) as of 04/24/2015 12:36  Ref. Range 04/22/2015 13:26  Hemoglobin A1C Latest Ref Range: 4.8-5.6 % 12.5 (H)    Diabetes history: No  Outpatient Diabetes medications: NA  Current Insulin Orders: Lantus 20 units daily      Novolog Moderate SSI (0-15 units) TID AC + HS      Novolog 5 units tidwc     -Note patient admitted with Hyperglycemia, New diagnosis of DM.  Glucose 983 mg/dl on admission.  -Current A1c= 12.5%.  Patient will likely need insulin at time of discharge.  Would likely benefit from establishment with local Endocrinologist if needs insulin at time of d/c.  -Spoke with pt about new diagnosis.  Discussed A1C results with him and explained what an A1C is, basic pathophysiology of DM Type 2, basic home care, basic diabetes diet nutrition principles, importance of checking CBGs and maintaining good CBG control to prevent long-term and short-term complications.  Also reviewed blood sugar goals at home.    -After speaking with RN caring for patient, learned patient appears slightly confused at times.  Will attempt to speak with wife when she arrives about new diagnosis.  -RNs to provide ongoing basic DM education at bedside with this patient and his wife.  Have ordered educational booklet, insulin starter kit, and DM videos.  Note RD visited with patient today to discuss DM diet nutrition at home.     --Will follow patient during hospitalization--  Wyn Quaker RN, MSN, CDE Diabetes  Coordinator Inpatient Glycemic Control Team Team Pager: [phone] (8a-5p)

## 2015-04-24 NOTE — Plan of Care (Signed)
Problem: Food- and Nutrition-Related Knowledge Deficit (NB-1.1) Goal: Nutrition education Formal process to instruct or train a patient/client in a skill or to impart knowledge to help patients/clients voluntarily manage or modify food choices and eating behavior to maintain or improve health. Outcome: Completed/Met Date Met:  04/24/15  RD consulted for nutrition education regarding diabetes. Patient with new onset DM, no previous education.     Lab Results  Component Value Date    HGBA1C 12.5* 04/22/2015    RD provided "Carbohydrate Counting for People with Diabetes" handout from the Academy of Nutrition and Dietetics. Discussed different food groups and their effects on blood sugar, emphasizing carbohydrate-containing foods. Provided list of carbohydrates and recommended serving sizes of common foods.  Discussed importance of controlled and consistent carbohydrate intake throughout the day. Provided examples of ways to balance meals/snacks and encouraged intake of high-fiber, whole grain complex carbohydrates. Teach back method used.  Expect fair compliance.  Body mass index is 26.98 kg/(m^2). Pt meets criteria for overweight based on current BMI.  Current diet order is CHO modified, patient is consuming approximately 75-100% of meals at this time. Labs and medications reviewed. No further nutrition interventions warranted at this time. RD contact information provided. If additional nutrition issues arise, please re-consult RD.  Molli Barrows, RD, LDN, [place] Pager [phone] After Hours Pager [phone]

## 2015-04-24 NOTE — Progress Notes (Signed)
Pt's wife has been very receptive to diabetes teaching and this evening's dose of insulin she administered to patient in return demonstration from earlier teaching. Living well with diabetes book given.

## 2015-04-24 NOTE — Clinical Documentation Improvement (Signed)
Internal Medicine  Can the diagnosis of CKD be further specified?   CKD Stage I - GFR greater than or equal to 90  CKD Stage II - GFR 60-89  CKD Stage III - GFR 30-59  CKD Stage IV - GFR 15-29  CKD Stage V - GFR < 15  ESRD (End Stage Renal Disease)  Other condition  Unable to clinically determine   Supporting Information: : (risk factors, signs and symptoms, diagnostics, treatment) 04/24/15: BUN= 43; Crea= 1.74; GFR= 45  Please exercise your independent, professional judgment when responding. A specific answer is not anticipated or expected.   Thank You, Rolm Gala, RN, [place] [phone]

## 2015-04-24 NOTE — Care Management Note (Addendum)
Case Management Note  Patient Details  Name: Alec Jacobson MRN: [id number] Date of Birth: 10-Oct-1945  Subjective/Objective:    Date: 04/24/15 Spoke with patient at the bedside.  Introduced self as Tourist information centre manager and explained role in discharge planning and how to be reached.  Verified patient lives in town, with spouse, pta indep.  Expressed potential need for no other DME.  Verified patient anticipates to go home with family, at time of discharge and will have full-time supervision by family at this time to best of their knowledge. Patient denied needing help with their medication.  Patient is driven by wife to MD appointments.  Verified patient has PCP Vincente Liberty. Patient is new Diabetic will need diabetic education, has insurance .   Plan: CM will continue to follow for discharge planning and [facility] resources.    Await pt eval.              Action/Plan:   Expected Discharge Date:                  Expected Discharge Plan:  [place]  In-House Referral:     Discharge planning Services  CM Consult  Post Acute Care Choice:    Choice offered to:     DME Arranged:    DME Agency:     HH Arranged:    HH Agency:     Status of Service:  In process, will continue to follow  Medicare Important Message Given:  Yes Date Medicare IM Given:    Medicare IM give by:    Date Additional Medicare IM Given:    Additional Medicare Important Message give by:     If discussed at [place] of Stay Meetings, dates discussed:    Additional Comments:  Zenon Mayo, RN 04/24/2015, 11:04 AM

## 2015-04-24 NOTE — Progress Notes (Signed)
Spoke with pt (again) and wife about new diagnosis.  Discussed A1C results with them and explained what an A1C is, basic pathophysiology of DM Type 2, basic home care, basic diabetes diet nutrition principles, importance of checking CBGs and maintaining good CBG control to prevent long-term and short-term complications.  Reviewed signs and symptoms of hyperglycemia and hypoglycemia and how to treat both conditions at home.  Also reviewed blood sugar goals at home.    Visited with patient again this afternoon as requested by RN as patient has appeared somewhat confused at times and has had poor concentration.  Also provided insulin pen education to wife of patient.  Reviewed all steps of insulin pen including attachment of needle, 2-unit air shot, dialing up dose, giving injection, removing needle, disposal of sharps, storage of unused insulin, disposal of insulin etc.  Patient able to provide successful return demonstration.  Reviewed troubleshooting with insulin pen.  Also reviewed Signs/Symptoms of Hypoglycemia with patient and how to treat Hypoglycemia at home.  Have asked RNs caring for patient to please allow patient to give all injections here in hospital as much as possible for practice.  MD to give patient Rxs for insulin pens and insulin pen needles.       MD- If you decide to send patient home on insulin, please consider also adding Metformin to home regimen as well.  Patient and wife would prefer insulin pens:  1. Lantus insulin pen- Order # [id number]  2. Novolog insulin pen- Order # [id number]. Insulin pen needles- Order # [id number]. Metformin- Order # [id number]. Blood glucose meter and supplies- Order # [id number] (will need to be printed for pt- cannot send CBG meter Rx electronically)    --Will follow patient during hospitalization--  Wyn Quaker RN, MSN, CDE Diabetes Coordinator Inpatient Glycemic Control Team Team Pager: [phone] (8a-5p)

## 2015-04-24 NOTE — Progress Notes (Signed)
PROGRESS NOTE  Alec Jacobson [id number] DOB: 1945/05/08 DOA: 04/22/2015 PCP: Leola Brazil, MD  Assessment/Plan: HHS/HONK Gap closed -lantus +novolog and SSI started -HgbA1C 12  Hypernatremia -due to HHS -IVF -improving  Hypertension.  PRN >170  AKI -baseline 1 Monitor intake and output BMP daily -improving with IVF  Hyperlipidemia Continue Crestor  Thrush Oral care and Magic Mouthwash  Diarrhea-- nursing reports foul smelling stool -GI pathogen panel  Thrombocytopenia -d/c lovenox -recheck and if low, get HIT panel  Code Status: full Family Communication: no family at bedside- called wife [facility] 1/30 Disposition Plan:    Consultants:  Diabetic coordinator  Procedures:      HPI/Subjective: drinking lots of fluid  Objective: Filed Vitals:   04/24/15 1243 04/24/15 1324  BP: 166/107 133/77  Pulse: 95 73  Temp: 98.9 F (37.2 C)   Resp: 30 20    Intake/Output Summary (Last 24 hours) at 04/24/15 1410 Last data filed at 04/24/15 1244  Gross per 24 hour  Intake 853.75 ml  Output   1600 ml  Net -746.25 ml   Filed Weights   04/22/15 1415  Weight: 87.7 kg (193 lb 5.5 oz)    Exam:   General:  Less confused today  Cardiovascular: rrr  Respiratory: clear  Abdomen: +BS, soft  Musculoskeletal: no edema   Data Reviewed: Basic Metabolic Panel:  Recent Labs Lab 04/22/15 1326  04/23/15 1922 04/23/15 2200 04/24/15 0132 04/24/15 0556 04/24/15 1100  NA 158*  < > 158* 154* 152* 149* 146*  K 3.8  < > 3.1* 3.7 3.8 4.0 3.7  CL 122*  < > 123* 119* 114* 113* 113*  CO2 18*  < > '22 22 22 22 22  '$ GLUCOSE 555*  < > 88 250* 288* 309* 381*  BUN 71*  < > 52* 53* 50* 47* 43*  CREATININE 2.72*  2.71*  < > 2.24* 2.23* 2.06* 1.92* 1.74*  CALCIUM 10.2  < > 8.8* 8.0* 8.3* 7.9* 7.8*  MG 3.3*  --   --   --   --   --   --   PHOS 3.2  --   --   --   --   --   --   < > = values in this interval not displayed. Liver Function  Tests:  Recent Labs Lab 04/22/15 1017  AST 19  ALT 26  ALKPHOS 141*  BILITOT 1.0  PROT 7.2  ALBUMIN 3.4*   No results for input(s): LIPASE, AMYLASE in the last 168 hours. No results for input(s): AMMONIA in the last 168 hours. CBC:  Recent Labs Lab 04/22/15 0921 04/23/15 2200  WBC 13.2* 8.0  NEUTROABS 11.5*  --   HGB 17.4* 14.0  HCT 54.5* 44.7  MCV 83.5 82.3  PLT 220 77*   Cardiac Enzymes:  Recent Labs Lab 04/22/15 1830 04/23/15 0131 04/23/15 0620 04/23/15 1335 04/23/15 1922  TROPONINI 0.07* 0.10* 0.15* 0.14* 0.15*   BNP (last 3 results) No results for input(s): BNP in the last 8760 hours.  ProBNP (last 3 results) No results for input(s): PROBNP in the last 8760 hours.  CBG:  Recent Labs Lab 04/23/15 1647 04/23/15 1814 04/23/15 2121 04/24/15 0812 04/24/15 1239  GLUCAP 181* 106* 137* 262* 320*    Recent Results (from the past 240 hour(s))  MRSA PCR Screening     Status: None   Collection Time: 04/22/15  3:17 PM  Result Value Ref Range Status   MRSA by PCR NEGATIVE NEGATIVE Final  Comment:        The GeneXpert MRSA Assay (FDA approved for NASAL specimens only), is one component of a comprehensive MRSA colonization surveillance program. It is not intended to diagnose MRSA infection nor to guide or monitor treatment for MRSA infections.      Studies: No results found.  Scheduled Meds: . enoxaparin (LOVENOX) injection  30 mg Subcutaneous Q24H  . insulin aspart  0-15 Units Subcutaneous TID WC  . insulin aspart  0-5 Units Subcutaneous QHS  . insulin aspart  5 Units Subcutaneous TID WC  . insulin glargine  20 Units Subcutaneous Daily  . insulin starter kit- syringes  1 kit Other Once  . living well with diabetes book   Does not apply Once  . living well with diabetes book   Does not apply Once  . magic mouthwash  5 mL Oral TID  . rosuvastatin  20 mg Oral Daily   Continuous Infusions: . sodium chloride 75 mL/hr at 04/24/15 1237    Antibiotics Given (last 72 hours)    None      Principal Problem:   Diabetes mellitus, new onset ([place]) Active Problems:   Hypertension   Hyperlipidemia   DKA (diabetic ketoacidoses) ([employer])   Thrush    Time spent: 35 min    [facility] Pager [phone] If 7PM-7AM, please contact night-coverage at [email or web address], password [facility] 04/24/2015, 2:10 PM  LOS: 2 days

## 2015-04-25 DIAGNOSIS — E870 Hyperosmolality and hypernatremia: Secondary | ICD-10-CM

## 2015-04-25 DIAGNOSIS — E785 Hyperlipidemia, unspecified: Secondary | ICD-10-CM

## 2015-04-25 LAB — BASIC METABOLIC PANEL
ANION GAP: 9 (ref 5–15)
BUN: 26 mg/dL — ABNORMAL HIGH (ref 6–20)
CO2: 23 mmol/L (ref 22–32)
Calcium: 7.7 mg/dL — ABNORMAL LOW (ref 8.9–10.3)
Chloride: 109 mmol/L (ref 101–111)
Creatinine, Ser: 1.43 mg/dL — ABNORMAL HIGH (ref 0.61–1.24)
GFR calc Af Amer: 57 mL/min — ABNORMAL LOW (ref >60)
GFR, EST NON AFRICAN AMERICAN: 49 mL/min — AB (ref >60)
GLUCOSE: 279 mg/dL — AB (ref 65–99)
POTASSIUM: 3.3 mmol/L — AB (ref 3.5–5.1)
Sodium: 141 mmol/L (ref 135–145)

## 2015-04-25 LAB — GLUCOSE, CAPILLARY
GLUCOSE-CAPILLARY: 217 mg/dL — AB (ref 65–99)
Glucose-Capillary: 241 mg/dL — ABNORMAL HIGH (ref 65–99)
Glucose-Capillary: 218 mg/dL — ABNORMAL HIGH (ref 65–99)

## 2015-04-25 MED ORDER — INSULIN GLARGINE 100 UNIT/ML ~~LOC~~ SOLN
30.0000 [IU] | Freq: Every day | SUBCUTANEOUS | Status: DC
  Administered 2015-04-25 – 2015-04-26 (×2): 30 [IU] via SUBCUTANEOUS
  Filled 2015-04-25 (×2): qty 0.3

## 2015-04-25 MED ORDER — HYDRALAZINE HCL 25 MG PO TABS
25.0000 mg | ORAL_TABLET | Freq: Three times a day (TID) | ORAL | Status: DC
  Administered 2015-04-25 – 2015-04-26 (×3): 25 mg via ORAL
  Filled 2015-04-25 (×3): qty 1

## 2015-04-25 NOTE — Progress Notes (Signed)
PROGRESS NOTE  Alec Jacobson [id number] DOB: July 03, 1945 DOA: 04/22/2015 PCP: Leola Brazil, MD  The patient is a 70 year old male with history of hypertension, prostate cancer who presented to the emergency department after became increasingly lethargic. Family reported he had a cold about a week ago and afterwards developed polyuria and polydipsia and polyphagia. Initial anion gap 23, initial glucose 983, sodium 152. He was started on IV fluids and glucose stabilizer.  HgbA1C of 12.  Slowly improved  Assessment/Plan: HHS/HONK Gap closed -lantus +novolog and SSI started-- increase lantus as tolerated -HgbA1C 12  Hypernatremia -due to HHS -IVF -improving  Hypertension.  PRN >170  AKI -baseline 1 Monitor intake and output BMP daily -improving with IVF  Hyperlipidemia Continue Crestor  Thrush Oral care and Magic Mouthwash  Diarrhea-- nursing reports foul smelling stool -GI pathogen panel negative  Thrombocytopenia -d/c lovenox - HIT panel- no recent exposure to heparin products in chart -SCDs  PT eval Home once blood sugars controlled  Code Status: full Family Communication: no family at bedside- called wife Alec Jacobson Disposition Plan: tx to med surge   Consultants:  Diabetic coordinator  Procedures:      HPI/Subjective: Doing better, eating and drinking well  Objective: Filed Vitals:   04/25/15 0500 04/25/15 0600  BP: 136/69 151/89  Pulse: 64 61  Temp:    Resp: 19 17    Intake/Output Summary (Last 24 hours) at 04/25/15 0808 Last data filed at 04/25/15 0400  Gross per 24 hour  Intake   1995 ml  Output    975 ml  Net   1020 ml   Filed Weights   04/22/15 1415  Weight: 87.7 kg (193 lb 5.5 oz)    Exam:   General:  A+Ox3, NAD  Cardiovascular: rrr  Respiratory: clear  Abdomen: +BS, soft  Musculoskeletal: no edema   Data Reviewed: Basic Metabolic Panel:  Recent Labs Lab 04/22/15 1326  04/23/15 1922 04/23/15 2200  04/24/15 0132 04/24/15 0556 04/24/15 1100  NA 158*  < > 158* 154* 152* 149* 146*  K 3.8  < > 3.1* 3.7 3.8 4.0 3.7  CL 122*  < > 123* 119* 114* 113* 113*  CO2 18*  < > _0 GLUCOSE 555*  < > 88 250* 288* 309* 381*  BUN 71*  < > 52* 53* 50* 47* 43*  CREATININE 2.72*  2.71*  < > 2.24* 2.23* 2.06* 1.92* 1.74*  CALCIUM 10.2  < > 8.8* 8.0* 8.3* 7.9* 7.8*  MG 3.3*  --   --   --   --   --   --   PHOS 3.2  --   --   --   --   --   --   < > = values in this interval not displayed. Liver Function Tests:  Recent Labs Lab 04/22/15 1017  AST 19  ALT 26  ALKPHOS 141*  BILITOT 1.0  PROT 7.2  ALBUMIN 3.4*   No results for input(s): LIPASE, AMYLASE in the last 168 hours. No results for input(s): AMMONIA in the last 168 hours. CBC:  Recent Labs Lab 04/22/15 0921 04/23/15 2200 04/24/15 1838  WBC 13.2* 8.0 8.3  NEUTROABS 11.5*  --  6.4  HGB 17.4* 14.0 13.2  HCT 54.5* 44.7 40.9  MCV 83.5 82.3 79.9  PLT 220 77* 66*   Cardiac Enzymes:  Recent Labs Lab 04/22/15 1830 04/23/15 0131 04/23/15 0620 04/23/15 1335 04/23/15 1922  TROPONINI 0.07* 0.10* 0.15* 0.14* 0.15*  BNP (last 3 results) No results for input(s): BNP in the last 8760 hours.  ProBNP (last 3 results) No results for input(s): PROBNP in the last 8760 hours.  CBG:  Recent Labs Lab 04/24/15 0812 04/24/15 1239 04/24/15 1652 04/24/15 2120 04/24/15 2124  GLUCAP 262* 320* 290* 213* 234*    Recent Results (from the past 240 hour(s))  MRSA PCR Screening     Status: None   Collection Time: 04/22/15  3:17 PM  Result Value Ref Range Status   MRSA by PCR NEGATIVE NEGATIVE Final    Comment:        The GeneXpert MRSA Assay (FDA approved for NASAL specimens only), is one component of a comprehensive MRSA colonization surveillance program. It is not intended to diagnose MRSA infection nor to guide or monitor treatment for MRSA infections.   Gastrointestinal Panel by PCR , Stool     Status: None    Collection Time: 04/23/15  8:24 PM  Result Value Ref Range Status   Campylobacter species NOT DETECTED NOT DETECTED Final   Plesimonas shigelloides NOT DETECTED NOT DETECTED Final   Salmonella species NOT DETECTED NOT DETECTED Final   Yersinia enterocolitica NOT DETECTED NOT DETECTED Final   Vibrio species NOT DETECTED NOT DETECTED Final   Vibrio cholerae NOT DETECTED NOT DETECTED Final   Enteroaggregative E coli (EAEC) NOT DETECTED NOT DETECTED Final   Enteropathogenic E coli (EPEC) NOT DETECTED NOT DETECTED Final   Enterotoxigenic E coli (ETEC) NOT DETECTED NOT DETECTED Final   Shiga like toxin producing E coli (STEC) NOT DETECTED NOT DETECTED Final   E. coli O157 NOT DETECTED NOT DETECTED Final   Shigella/Enteroinvasive E coli (EIEC) NOT DETECTED NOT DETECTED Final   Cryptosporidium NOT DETECTED NOT DETECTED Final   Cyclospora cayetanensis NOT DETECTED NOT DETECTED Final   Entamoeba histolytica NOT DETECTED NOT DETECTED Final   Giardia lamblia NOT DETECTED NOT DETECTED Final   Adenovirus F40/41 NOT DETECTED NOT DETECTED Final   Astrovirus NOT DETECTED NOT DETECTED Final   Norovirus GI/GII NOT DETECTED NOT DETECTED Final   Rotavirus A NOT DETECTED NOT DETECTED Final   Sapovirus (I, II, IV, and V) NOT DETECTED NOT DETECTED Final     Studies: No results found.  Scheduled Meds: . insulin aspart  0-15 Units Subcutaneous TID WC  . insulin aspart  0-5 Units Subcutaneous QHS  . insulin aspart  5 Units Subcutaneous TID WC  . insulin glargine  30 Units Subcutaneous Daily  . insulin starter kit- syringes  1 kit Other Once  . magic mouthwash  5 mL Oral TID  . rosuvastatin  20 mg Oral Daily   Continuous Infusions: . sodium chloride 75 mL/hr at 04/25/15 0248   Antibiotics Given (last 72 hours)    None      Principal Problem:   Diabetes mellitus, new onset ([place]) Active Problems:   Hypertension   Hyperlipidemia   DKA (diabetic ketoacidoses) ([place])   Thrush    Hypernatremia    Time spent: 25 min    [facility] Pager [phone] If 7PM-7AM, please contact night-coverage at [email or web address], password [facility] 04/25/2015, 8:08 AM  LOS: 3 days

## 2015-04-25 NOTE — Progress Notes (Signed)
Transfer note:  Arrival Method: Wheelchair from 3S Mental Orientation: A&OX4 Telemetry: N/A Assessment: See flowsheet Skin: Dry, intact IV: Lt hand  Pain: Denies Safety Measures: Bed in lowest position, call light within reach, yellow socks placed,  6700 Orientation: Patient has been oriented to the unit, staff and to the room.  Orders have been reviewed and implemented. Will continue to assess and monitor pt.  Carole Civil, RN

## 2015-04-25 NOTE — Evaluation (Signed)
Physical Therapy Evaluation Patient Details Name: Alec Jacobson MRN: [id number] DOB: [birth year] Today's Date: 04/25/2015   History of Present Illness  Pt is a 70 y/o M admitted after becoming increasingly lethargic and w/ AMS.  Initial glucose 983, sodium 152.  Pt's PMH includes CKD, cancer, HTN.  Clinical Impression  Pt admitted with above diagnosis. Pt at independent level of mobility w/ no instability noted w/ high level balance activities. No skilled PT needs, PT is signing off.   Follow Up Recommendations No PT follow up    Equipment Recommendations  None recommended by PT    Recommendations for Other Services       Precautions / Restrictions Precautions Precautions: None Restrictions Weight Bearing Restrictions: No      Mobility  Bed Mobility Overal bed mobility: Independent             General bed mobility comments: No physical assist or cues needed  Transfers Overall transfer level: Independent               General transfer comment: No physical assist or cues needed  Ambulation/Gait Ambulation/Gait assistance: Independent Ambulation Distance (Feet): 300 Feet Assistive device: None Gait Pattern/deviations: WFL(Within Functional Limits)   Gait velocity interpretation: at or above normal speed for age/gender General Gait Details: No physical assist or cues needed.  No instability noted w/ high level balance activities as documented below  Science writer    Modified Rankin (Stroke Patients Only)       Balance Overall balance assessment: Independent (denies h/o falls)                           High level balance activites: Turns;Sudden stops;Head turns;Backward walking High Level Balance Comments: No instability noted Standardized Balance Assessment Standardized Balance Assessment : Dynamic Gait Index   Dynamic Gait Index Level Surface: Normal Gait with Horizontal Head Turns: Normal Gait with  Vertical Head Turns: Normal Gait and Pivot Turn: Normal Step Over Obstacle: Normal       Pertinent Vitals/Pain Pain Assessment: No/denies pain    Home Living Family/patient expects to be discharged to:: Private residence Living Arrangements: Spouse/significant other Available Help at Discharge: Family;Available PRN/intermittently Type of Home: House Home Access: Level entry     Home Layout: One level Home Equipment: None      Prior Function Level of Independence: Independent         Comments: Works as a Architectural technologist        Extremity/Trunk Assessment   Upper Extremity Assessment: Overall WFL for tasks assessed           Lower Extremity Assessment: Overall WFL for tasks assessed      Cervical / Trunk Assessment: Normal  Communication   Communication: No difficulties  Cognition Arousal/Alertness: Awake/alert Behavior During Therapy: WFL for tasks assessed/performed Overall Cognitive Status: Within Functional Limits for tasks assessed                      General Comments      Exercises General Exercises - Lower Extremity Long Arc Quad: AROM;Both;10 reps;Seated Hip Flexion/Marching: Strengthening;10 reps;Standing Other Exercises Other Exercises: Sit>stand x10 w/o use of UEs      Assessment/Plan    PT Assessment Patent does not need any further PT services  PT Diagnosis Altered mental status   PT Problem List  PT Treatment Interventions     PT Goals (Current goals can be found in the Care Plan section)      Frequency     Barriers to discharge        Co-evaluation               End of Session Equipment Utilized During Treatment: Gait belt Activity Tolerance: Patient tolerated treatment well Patient left: in chair;with call bell/phone within reach;with nursing/sitter in room Nurse Communication: Mobility status         Time: [id number] PT Time Calculation (min) (ACUTE ONLY): 22  min   Charges:   PT Evaluation $PT Eval Low Complexity: 1 Procedure     PT G CodesJoslyn Hy PT, DPT [phone] Pager: [phone] 04/25/2015, 12:11 PM

## 2015-04-25 NOTE — Progress Notes (Signed)
Inpatient Diabetes Program Recommendations  AACE/ADA: New Consensus Statement on Inpatient Glycemic Control (2015)  Target Ranges:  Prepandial:   less than 140 mg/dL      Peak postprandial:   less than 180 mg/dL (1-2 hours)      Critically ill patients:  140 - 180 mg/dL   Results for Alec Jacobson, Alec Jacobson (MRN [id number]) as of 04/25/2015 08:33  Ref. Range 04/24/2015 08:12 04/24/2015 12:39 04/24/2015 16:52 04/24/2015 21:20  Glucose-Capillary Latest Ref Range: 65-99 mg/dL 262 (H) 320 (H) 290 (H) 213 (H)     Current Insulin Orders: Lantus 30 units daily  Novolog Moderate SSI (0-15 units) TID AC + HS  Novolog 5 units tidwc      MD- Note Lantus increased to 30 units daily today.  Patient still having elevated postprandial glucose levels.  Please consider increasing Novolog Meal Coverage to 8 units tidwc    --Will follow patient during hospitalization--  Wyn Quaker RN, MSN, CDE Diabetes Coordinator Inpatient Glycemic Control Team Team Pager: [phone] (8a-5p)

## 2015-04-26 DIAGNOSIS — R4182 Altered mental status, unspecified: Secondary | ICD-10-CM | POA: Insufficient documentation

## 2015-04-26 DIAGNOSIS — E1101 Type 2 diabetes mellitus with hyperosmolarity with coma: Secondary | ICD-10-CM

## 2015-04-26 DIAGNOSIS — E875 Hyperkalemia: Secondary | ICD-10-CM | POA: Insufficient documentation

## 2015-04-26 LAB — BASIC METABOLIC PANEL
Anion gap: 11 (ref 5–15)
BUN: 20 mg/dL (ref 6–20)
CHLORIDE: 109 mmol/L (ref 101–111)
CO2: 21 mmol/L — AB (ref 22–32)
Calcium: 7.6 mg/dL — ABNORMAL LOW (ref 8.9–10.3)
Creatinine, Ser: 1.21 mg/dL (ref 0.61–1.24)
GFR calc non Af Amer: 60 mL/min — ABNORMAL LOW (ref >60)
Glucose, Bld: 167 mg/dL — ABNORMAL HIGH (ref 65–99)
POTASSIUM: 3.3 mmol/L — AB (ref 3.5–5.1)
SODIUM: 141 mmol/L (ref 135–145)

## 2015-04-26 LAB — CBC
HEMATOCRIT: 38.7 % — AB (ref 39.0–52.0)
HEMOGLOBIN: 12.9 g/dL — AB (ref 13.0–17.0)
MCH: 26.4 pg (ref 26.0–34.0)
MCHC: 33.3 g/dL (ref 30.0–36.0)
MCV: 79.3 fL (ref 78.0–100.0)
Platelets: 60 10*3/uL — ABNORMAL LOW (ref 150–400)
RBC: 4.88 MIL/uL (ref 4.22–5.81)
RDW: 16.6 % — ABNORMAL HIGH (ref 11.5–15.5)
WBC: 9.1 10*3/uL (ref 4.0–10.5)

## 2015-04-26 LAB — GLUCOSE, CAPILLARY
GLUCOSE-CAPILLARY: 252 mg/dL — AB (ref 65–99)
Glucose-Capillary: 146 mg/dL — ABNORMAL HIGH (ref 65–99)

## 2015-04-26 MED ORDER — AMLODIPINE BESYLATE 5 MG PO TABS: 5.0000 mg | ORAL_TABLET | Freq: Every day | ORAL | Status: AC

## 2015-04-26 MED ORDER — INSULIN PEN NEEDLE 32G X 4 MM MISC: Status: DC

## 2015-04-26 MED ORDER — INSULIN ASPART 100 UNIT/ML ~~LOC~~ SOLN
8.0000 [IU] | Freq: Three times a day (TID) | SUBCUTANEOUS | Status: DC
  Administered 2015-04-26: 8 [IU] via SUBCUTANEOUS

## 2015-04-26 MED ORDER — INSULIN ASPART 100 UNIT/ML FLEXPEN: 8.0000 [IU] | PEN_INJECTOR | Freq: Three times a day (TID) | SUBCUTANEOUS | Status: DC

## 2015-04-26 MED ORDER — AMLODIPINE BESYLATE 5 MG PO TABS
5.0000 mg | ORAL_TABLET | Freq: Every day | ORAL | Status: DC
  Administered 2015-04-26: 5 mg via ORAL
  Filled 2015-04-26: qty 1

## 2015-04-26 MED ORDER — BLOOD GLUCOSE METER KIT: PACK | Status: AC

## 2015-04-26 MED ORDER — POTASSIUM CHLORIDE CRYS ER 20 MEQ PO TBCR
20.0000 meq | EXTENDED_RELEASE_TABLET | Freq: Once | ORAL | Status: AC
  Administered 2015-04-26: 20 meq via ORAL
  Filled 2015-04-26: qty 1

## 2015-04-26 MED ORDER — INSULIN GLARGINE 100 UNIT/ML SOLOSTAR PEN: 30.0000 [IU] | PEN_INJECTOR | Freq: Every day | SUBCUTANEOUS | Status: DC

## 2015-04-26 NOTE — Progress Notes (Signed)
Inpatient Diabetes Program Recommendations  AACE/ADA: New Consensus Statement on Inpatient Glycemic Control (2015)  Target Ranges:  Prepandial:   less than 140 mg/dL      Peak postprandial:   less than 180 mg/dL (1-2 hours)      Critically ill patients:  140 - 180 mg/dL   Review of Glycemic Control  Patient for discharge today. Spoke with patient briefly to assure that he understands his discharge orders and feels comfortable with checking blood sugars at home as well as administering his insulin. No further needs at this time.   Thank you Rosita Kea, RN, MSN, CDE  Diabetes Inpatient Program Office: [phone] Pager: [phone] 8:00 am to 5:00 pm

## 2015-04-26 NOTE — Progress Notes (Signed)
PT Cancellation Note  Patient Details Name: Alec Jacobson MRN: [id number] DOB: [birth year]   Cancelled Treatment:    Reason Eval/Treat Not Completed: PT screened, no needs identified, will sign off.  New PT eval received, chart reviewed. This patient was evaluated on 04/25/15 and was independent with all mobility, ambulating 300 feet with therapist. Will sign off at this time. If needs change, please reconsult.    Rolinda Roan 04/26/2015, 7:28 AM   Rolinda Roan, PT, DPT Acute Rehabilitation Services Pager: [phone]

## 2015-04-26 NOTE — Discharge Summary (Signed)
Physician Discharge Summary  JARAMIAH BOSSARD ENM:[id number] DOB: 03/24/1946 DOA: 04/22/2015  PCP: Leola Brazil, MD  Admit date: 04/22/2015 Discharge date: 04/26/2015  Recommendations for Outpatient Follow-up:  1. Pt will need to follow up with PCP in 2 weeks post discharge 2. Please obtain BMP and CBC in one week Discharge Diagnoses:  HHS/HONK Gap closed -lantus +novolog and SSI started-- increased lantus  -HgbA1C 12.5 -Home with Lantus pen--30 units daily -NovoLog 8 units with meals -Patient instructed to keep a glycemic log with which she will take to his primary care provider   Hypernatremia -due to HHS/dehydration  -IVF -Resolved   Hypertension.  -hold olmesartan/HCTZ in setting of AKI -home with amlodipine 5 mg daily  AKI -04/11/2014 creatinine 1.04  -Admission creatinine 3.38  -Discharge creatinine 1.21  -improving with IVF  Hyperlipidemia Continue Crestor  Thrush Oral care and Magic Mouthwash -resolved  Diarrhea--  -nursing reports foul smelling stool -GI pathogen panel negative -resolved  Thrombocytopenia -d/c lovenox - no objective signs of bleeding -May be in part due to dilution -Please obtain follow up CBC in 1 week--may require additional workup-SCDs  Hypokalemia -Repleted  Discharge Condition: stable  Disposition: home  Diet: Carbohydrate modified Wt Readings from Last 3 Encounters:  04/25/15 95.618 kg (210 lb 12.8 oz)  04/13/14 97.977 kg (216 lb)  04/11/14 98.34 kg (216 lb 12.8 oz)    History of present illness:  70 year old male with history of hypertension, prostate cancer who presented to the emergency department after became increasingly lethargic. Family reported he had a cold about a week ago and afterwards developed polyuria and polydipsia and polyphagia. In the emergency department, vital signs were notable for tachycardia, tachypnea, hypertension. Labs were concerning for hemoconcentration and DKA with acute kidney  injury, creatinine up to 3.38, bicarbonate 17, anion gap 23, initial glucose 983, sodium 152. He was started on IV fluids and glucose stabilizer. Chest x-ray was unremarkable. Urinalysis confirmed ketones in the urine and glucosuria. On exam he remains lethargic and confused but is arousable. Very dry mucous membranes with denuded skin on the soft palate.the patient was started on intravenous fluids and intravenous insulin. His CBGs improved. The patient was transitioned to subcutaneous insulin. His Lantus and NovoLog dosing were adjusted. Diabetic education was provided. The patient was set up with outpatient diabetic education classes.   Discharge Exam: Filed Vitals:   04/25/15 2100 04/26/15 0950  BP: 135/81 135/50  Pulse: 91 99  Temp: 99.3 F (37.4 C) 99 F (37.2 C)  Resp:  18   Filed Vitals:   04/25/15 1518 04/25/15 1800 04/25/15 2100 04/26/15 0950  BP: 157/82 145/83 135/81 135/50  Pulse: 95 103 91 99  Temp: 98.7 F (37.1 C) 98.2 F (36.8 C) 99.3 F (37.4 C) 99 F (37.2 C)  TempSrc: Oral Oral Oral Oral  Resp: '20 18  18  '$ Height: '5\' 10"'$  (1.778 m)     Weight: 94.62 kg (208 lb 9.6 oz)  95.618 kg (210 lb 12.8 oz)   SpO2: 99% 100% 100% 96%   General: A&O x 3, NAD, pleasant, cooperative Cardiovascular: RRR, no rub, no gallop, no S3 Respiratory: CTAB, no wheeze, no rhonchi Abdomen:soft, nontender, nondistended, positive bowel sounds Extremities: trace LE edema, No lymphangitis, no petechiae  Discharge Instructions  Discharge Instructions    Amb Referral to Nutrition and Diabetic E    Complete by:  As directed   New diagnosis of DM.  A1c 12.5%.  PCP: Dr. Vincente Liberty.    Patient: Please  call the [facility] after discharge to schedule an appointment for diabetes education if you do not hear from the center before discharge  [phone]     Diet - low sodium heart healthy    Complete by:  As directed      Increase activity slowly     Complete by:  As directed             Medication List    STOP taking these medications        olmesartan-hydrochlorothiazide 40-12.5 MG tablet  Commonly known as:  BENICAR HCT     Pseudoephedrine-Ibuprofen 30-200 MG Tabs      TAKE these medications        amLODipine 5 MG tablet  Commonly known as:  NORVASC  Take 1 tablet (5 mg total) by mouth daily.     blood glucose meter kit and supplies  Dispense based on patient and insurance preference. Use up to four times daily as directed. (FOR ICD-9 250.00, 250.01).     cyclobenzaprine 10 MG tablet  Commonly known as:  FLEXERIL  Take 10 mg by mouth 3 (three) times daily as needed for muscle spasms.     insulin aspart 100 UNIT/ML FlexPen  Commonly known as:  NOVOLOG FLEXPEN  Inject 8 Units into the skin 3 (three) times daily with meals.     Insulin Glargine 100 UNIT/ML Solostar Pen  Commonly known as:  LANTUS SOLOSTAR  Inject 30 Units into the skin daily at 10 pm.     Insulin Pen Needle 32G X 4 MM Misc  Use with insulin pens to dispense insulin as directed     rosuvastatin 20 MG tablet  Commonly known as:  CRESTOR  Take 20 mg by mouth daily.         The results of significant diagnostics from this hospitalization (including imaging, microbiology, ancillary and laboratory) are listed below for reference.    Significant Diagnostic Studies: Dg Chest Port 1 View  04/22/2015  CLINICAL DATA:  High blood sugar. Altered level of consciousness. Tachycardia. Flu-like symptoms for the past week. EXAM: PORTABLE CHEST 1 VIEW COMPARISON:  04/21/2008; 03/02/2007 FINDINGS: Grossly unchanged borderline enlarged cardiac silhouette and mediastinal contours given persistently reduced lung volumes. Grossly unchanged perihilar heterogeneous opacities favored to represent atelectasis. No new focal airspace opacities. No pleural effusion or pneumothorax. No evidence of edema. No acute osseus abnormalities. IMPRESSION: No acute cardiopulmonary  disease on this hypoventilated AP portable examination. Electronically Signed   By: Sandi Mariscal M.D.   On: 04/22/2015 09:35     Microbiology: Recent Results (from the past 240 hour(s))  MRSA PCR Screening     Status: None   Collection Time: 04/22/15  3:17 PM  Result Value Ref Range Status   MRSA by PCR NEGATIVE NEGATIVE Final    Comment:        The GeneXpert MRSA Assay (FDA approved for NASAL specimens only), is one component of a comprehensive MRSA colonization surveillance program. It is not intended to diagnose MRSA infection nor to guide or monitor treatment for MRSA infections.   Gastrointestinal Panel by PCR , Stool     Status: None   Collection Time: 04/23/15  8:24 PM  Result Value Ref Range Status   Campylobacter species NOT DETECTED NOT DETECTED Final   Plesimonas shigelloides NOT DETECTED NOT DETECTED Final   Salmonella species NOT DETECTED NOT DETECTED Final   Yersinia enterocolitica NOT DETECTED NOT DETECTED Final   Vibrio  species NOT DETECTED NOT DETECTED Final   Vibrio cholerae NOT DETECTED NOT DETECTED Final   Enteroaggregative E coli (EAEC) NOT DETECTED NOT DETECTED Final   Enteropathogenic E coli (EPEC) NOT DETECTED NOT DETECTED Final   Enterotoxigenic E coli (ETEC) NOT DETECTED NOT DETECTED Final   Shiga like toxin producing E coli (STEC) NOT DETECTED NOT DETECTED Final   E. coli O157 NOT DETECTED NOT DETECTED Final   Shigella/Enteroinvasive E coli (EIEC) NOT DETECTED NOT DETECTED Final   Cryptosporidium NOT DETECTED NOT DETECTED Final   Cyclospora cayetanensis NOT DETECTED NOT DETECTED Final   Entamoeba histolytica NOT DETECTED NOT DETECTED Final   Giardia lamblia NOT DETECTED NOT DETECTED Final   Adenovirus F40/41 NOT DETECTED NOT DETECTED Final   Astrovirus NOT DETECTED NOT DETECTED Final   Norovirus GI/GII NOT DETECTED NOT DETECTED Final   Rotavirus A NOT DETECTED NOT DETECTED Final   Sapovirus (I, II, IV, and V) NOT DETECTED NOT DETECTED Final      Labs: Basic Metabolic Panel:  Recent Labs Lab 04/22/15 1326  04/24/15 0132 04/24/15 0556 04/24/15 1100 04/25/15 1910 04/26/15 0856  NA 158*  < > 152* 149* 146* 141 141  K 3.8  < > 3.8 4.0 3.7 3.3* 3.3*  CL 122*  < > 114* 113* 113* 109 109  CO2 18*  < > '22 22 22 23 '$ 21*  GLUCOSE 555*  < > 288* 309* 381* 279* 167*  BUN 71*  < > 50* 47* 43* 26* 20  CREATININE 2.72*  2.71*  < > 2.06* 1.92* 1.74* 1.43* 1.21  CALCIUM 10.2  < > 8.3* 7.9* 7.8* 7.7* 7.6*  MG 3.3*  --   --   --   --   --   --   PHOS 3.2  --   --   --   --   --   --   < > = values in this interval not displayed. Liver Function Tests:  Recent Labs Lab 04/22/15 1017  AST 19  ALT 26  ALKPHOS 141*  BILITOT 1.0  PROT 7.2  ALBUMIN 3.4*   No results for input(s): LIPASE, AMYLASE in the last 168 hours. No results for input(s): AMMONIA in the last 168 hours. CBC:  Recent Labs Lab 04/22/15 0921 04/23/15 2200 04/24/15 1838 04/26/15 0856  WBC 13.2* 8.0 8.3 9.1  NEUTROABS 11.5*  --  6.4  --   HGB 17.4* 14.0 13.2 12.9*  HCT 54.5* 44.7 40.9 38.7*  MCV 83.5 82.3 79.9 79.3  PLT 220 77* 66* 60*   Cardiac Enzymes:  Recent Labs Lab 04/22/15 1830 04/23/15 0131 04/23/15 0620 04/23/15 1335 04/23/15 1922  TROPONINI 0.07* 0.10* 0.15* 0.14* 0.15*   BNP: Invalid input(s): POCBNP CBG:  Recent Labs Lab 04/24/15 2124 04/25/15 0758 04/25/15 1200 04/25/15 1525 04/26/15 0756  GLUCAP 234* 217* 241* 218* 146*    Time coordinating discharge:  Greater than 30 minutes  Signed:  Faust Thorington, DO [facility] Pager: [phone] 04/26/2015, 10:30 AM

## 2015-04-26 NOTE — Progress Notes (Addendum)
DC instructions given to patient. Questions answered. Educated on importance of DM control and awareness and insulin regimen. DM coordinators contacted prior to DC. Pt has Rx for insulin pens, cbg machine and equipment. VSS. PIV DC, hemostasis achieved. Will continue to monitor until time of DC. Delayed d/t awaiting transportation. Received release for return to work by Dr. Carles Collet. Pt eating lunch after insulin given at 1334.                    Pt ambulatory, escorted by NT to private vehicle driven by family member.

## 2015-04-27 LAB — HEPARIN INDUCED PLATELET AB (HIT ANTIBODY): HEPARIN INDUCED PLT AB: 0.454 {OD_unit} — AB (ref 0.000–0.400)

## 2015-05-09 ENCOUNTER — Ambulatory Visit: Payer: Self-pay

## 2015-05-14 ENCOUNTER — Ambulatory Visit (INDEPENDENT_AMBULATORY_CARE_PROVIDER_SITE_OTHER): Payer: 59 | Admitting: Internal Medicine

## 2015-05-14 VITALS — BP 136/72 | HR 92 | Temp 100.3°F | Resp 18 | Ht 68.0 in | Wt 205.0 lb

## 2015-05-14 DIAGNOSIS — IMO0001 Reserved for inherently not codable concepts without codable children: Secondary | ICD-10-CM

## 2015-05-14 DIAGNOSIS — M791 Myalgia: Secondary | ICD-10-CM | POA: Diagnosis not present

## 2015-05-14 DIAGNOSIS — R05 Cough: Secondary | ICD-10-CM | POA: Diagnosis not present

## 2015-05-14 DIAGNOSIS — R059 Cough, unspecified: Secondary | ICD-10-CM

## 2015-05-14 DIAGNOSIS — E119 Type 2 diabetes mellitus without complications: Secondary | ICD-10-CM

## 2015-05-14 DIAGNOSIS — M609 Myositis, unspecified: Secondary | ICD-10-CM | POA: Diagnosis not present

## 2015-05-14 DIAGNOSIS — R509 Fever, unspecified: Secondary | ICD-10-CM

## 2015-05-14 DIAGNOSIS — Z794 Long term (current) use of insulin: Secondary | ICD-10-CM | POA: Diagnosis not present

## 2015-05-14 LAB — POCT CBC
Granulocyte percent: 81.5 %G — AB (ref 37–80)
HCT, POC: 38.3 % — AB (ref 43.5–53.7)
HEMOGLOBIN: 12.7 g/dL — AB (ref 14.1–18.1)
Lymph, poc: 0.7 (ref 0.6–3.4)
MCH, POC: 26.3 pg — AB (ref 27–31.2)
MCHC: 33 g/dL (ref 31.8–35.4)
MCV: 79.7 fL — AB (ref 80–97)
MID (CBC): 0.5 (ref 0–0.9)
MPV: 5.9 fL (ref 0–99.8)
PLATELET COUNT, POC: 216 10*3/uL (ref 142–424)
POC Granulocyte: 5.5 (ref 2–6.9)
POC LYMPH PERCENT: 10.5 %L (ref 10–50)
POC MID %: 8 %M (ref 0–12)
RBC: 4.81 M/uL (ref 4.69–6.13)
RDW, POC: 17.8 %
WBC: 6.7 10*3/uL (ref 4.6–10.2)

## 2015-05-14 LAB — POCT INFLUENZA A/B
INFLUENZA A, POC: NEGATIVE
INFLUENZA B, POC: NEGATIVE

## 2015-05-14 LAB — GLUCOSE, POCT (MANUAL RESULT ENTRY): POC GLUCOSE: 83 mg/dL (ref 70–99)

## 2015-05-14 MED ORDER — OSELTAMIVIR PHOSPHATE 75 MG PO CAPS: 75.0000 mg | ORAL_CAPSULE | Freq: Two times a day (BID) | ORAL | Status: DC

## 2015-05-14 MED ORDER — HYDROCODONE-HOMATROPINE 5-1.5 MG/5ML PO SYRP: 5.0000 mL | ORAL_SOLUTION | Freq: Four times a day (QID) | ORAL | Status: DC | PRN

## 2015-05-14 NOTE — Progress Notes (Signed)
Subjective:    Patient ID: Alec Jacobson, male    DOB: [birth year], 70 y.o.   MRN: [id number] By signing my name below, I, Alec Jacobson, attest that this documentation has been prepared under the direction and in the presence of Tami Lin, MD.  Electronically Signed: Zola Jacobson, Medical Scribe. 05/14/2015. 9:44 AM.  HPI HPI Comments: Alec Jacobson is a 70 y.o. male with a history of hypertension and diabetes mellitus who presents to the [facility] complaining of gradual onset, dry cough that started last night. The cough was worse this morning. Patient reports having associated fatigue, sore throat, chills, and fever. He denies congestion and SOB. He did receive the flu shot this year. Patient was recently started on amlodipine.   Review of Systems     Objective:   Physical Exam  Constitutional: He is oriented to person, place, and time. He appears well-developed and well-nourished. No distress.  HENT:  Head: Normocephalic and atraumatic.  Mouth/Throat: Oropharynx is clear and moist. No oropharyngeal exudate.  Eyes: Pupils are equal, round, and reactive to light.  Neck: Neck supple.  Cardiovascular: Normal rate.   Pulmonary/Chest: Effort normal.  Musculoskeletal: He exhibits no edema.  Neurological: He is alert and oriented to person, place, and time. No cranial nerve deficit.  Skin: Skin is warm and dry. No rash noted.  Psychiatric: He has a normal mood and affect. His behavior is normal.  Nursing note and vitals reviewed. BP 136/72 mmHg  Pulse 92  Temp(Src) 100.3 F (37.9 C)  Resp 18  Ht 5\' 8"  (1.727 m)  Wt 205 lb (92.987 kg)  BMI 31.18 kg/m2  SpO2 98%    Results for orders placed or performed in visit on 05/14/15  POCT Influenza A/B  Result Value Ref Range   Influenza A, POC Negative Negative   Influenza B, POC Negative Negative  POCT CBC  Result Value Ref Range   WBC 6.7 4.6 - 10.2 K/uL   Lymph, poc 0.7 0.6 - 3.4   POC LYMPH PERCENT  10.5 10 - 50 %L   MID (cbc) 0.5 0 - 0.9   POC MID % 8.0 0 - 12 %M   POC Granulocyte 5.5 2 - 6.9   Granulocyte percent 81.5 (A) 37 - 80 %G   RBC 4.81 4.69 - 6.13 M/uL   Hemoglobin 12.7 (A) 14.1 - 18.1 g/dL   HCT, POC 38.3 (A) 43.5 - 53.7 %   MCV 79.7 (A) 80 - 97 fL   MCH, POC 26.3 (A) 27 - 31.2 pg   MCHC 33.0 31.8 - 35.4 g/dL   RDW, POC 17.8 %   Platelet Count, POC 216 142 - 424 K/uL   MPV 5.9 0 - 99.8 fL  POCT glucose (manual entry)  Result Value Ref Range   POC Glucose 83 70 - 99 mg/dl        Assessment & Plan:  Fever, unspecified fever cause - Plan: POCT Influenza A/B, POCT CBC  Cough - Plan: POCT Influenza A/B, POCT CBC  Myalgia and myositis - Plan: POCT Influenza A/B  IDDM (insulin dependent diabetes mellitus) ([place]) - Plan: POCT glucose (manual entry)  Flu suspected  Meds ordered this encounter  Medications  . HYDROcodone-homatropine (HYCODAN) 5-1.5 MG/5ML syrup    Sig: Take 5 mLs by mouth every 6 (six) hours as needed.    Dispense:  120 mL    Refill:  0  . oseltamivir (TAMIFLU) 75 MG capsule  Sig: Take 1 capsule (75 mg total) by mouth 2 (two) times daily.    Dispense:  10 capsule    Refill:  0   Tylenol f/u if cough becomes productive  I have completed the patient encounter in its entirety as documented by the scribe, with editing by me where necessary. Robert P. Laney Pastor, M.D.

## 2015-05-16 ENCOUNTER — Ambulatory Visit: Payer: Self-pay

## 2015-05-23 ENCOUNTER — Ambulatory Visit: Payer: Self-pay

## 2015-05-24 ENCOUNTER — Encounter: Payer: Self-pay | Admitting: Endocrinology

## 2015-05-24 ENCOUNTER — Ambulatory Visit (INDEPENDENT_AMBULATORY_CARE_PROVIDER_SITE_OTHER): Payer: 59 | Admitting: Endocrinology

## 2015-05-24 VITALS — BP 138/87 | HR 67 | Temp 98.4°F | Ht 68.0 in | Wt 200.0 lb

## 2015-05-24 DIAGNOSIS — E109 Type 1 diabetes mellitus without complications: Secondary | ICD-10-CM | POA: Diagnosis not present

## 2015-05-24 DIAGNOSIS — E119 Type 2 diabetes mellitus without complications: Secondary | ICD-10-CM | POA: Insufficient documentation

## 2015-05-24 LAB — POCT GLYCOSYLATED HEMOGLOBIN (HGB A1C): HEMOGLOBIN A1C: 8.9

## 2015-05-24 MED ORDER — INSULIN GLARGINE 100 UNIT/ML SOLOSTAR PEN: 25.0000 [IU] | PEN_INJECTOR | Freq: Every day | SUBCUTANEOUS | Status: DC

## 2015-05-24 NOTE — Progress Notes (Signed)
Subjective:    Patient ID: Alec Jacobson, male    DOB: [birth year], 70 y.o.   MRN: [id number]  HPI pt states DM was dx'ed 1 month ago, when he presented with DKA; he has mild if any neuropathy of the lower extremities; he is unaware of any associated chronic complications; he has been on insulin since dx; pt says his diet and exercise are much better since hosp d/c; he has never had pancreatitis or severe hypoglycemia.  He was d/c'ed home on multiple daily injections.  he brings a record of his cbg's which i have reviewed today.  It varies from 69-134.  There is no trend throughout the day.  Past Medical History  Diagnosis Date  . H/O allergic rhinitis     job related  . Hypertension   . Chronic kidney disease     over 30 yrs ago, had one stone  . Cancer [facility])     prostate    Past Surgical History  Procedure Laterality Date  . Nasal sinus surgery    . Tonsillectomy    . Sinus endo with fusion Bilateral 04/13/2014    Procedure: SINUS ENDO WITH FUSION;  Surgeon: Ruby Cola, MD;  Location: [facility] OR;  Service: ENT;  Laterality: Bilateral;    Social History   Social History  . Marital Status: Married    Spouse Name: N/A  . Number of Children: N/A  . Years of Education: N/A   Occupational History  . Not on file.   Social History Main Topics  . Smoking status: Never Smoker   . Smokeless tobacco: Never Used  . Alcohol Use: No  . Drug Use: No  . Sexual Activity: Not on file   Other Topics Concern  . Not on file   Social History Narrative    Current Outpatient Prescriptions on File Prior to Visit  Medication Sig Dispense Refill  . amLODipine (NORVASC) 5 MG tablet Take 1 tablet (5 mg total) by mouth daily. 30 tablet 1  . blood glucose meter kit and supplies Dispense based on patient and insurance preference. Use up to four times daily as directed. (FOR ICD-9 250.00, 250.01). 1 each 0  . insulin aspart (NOVOLOG FLEXPEN) 100 UNIT/ML FlexPen Inject 8 Units into the skin 3  (three) times daily with meals. 15 mL 1  . Insulin Pen Needle 32G X 4 MM MISC Use with insulin pens to dispense insulin as directed 100 each 1  . rosuvastatin (CRESTOR) 20 MG tablet Take 20 mg by mouth daily.     No current facility-administered medications on file prior to visit.    No Known Allergies  Family History  Problem Relation Age of Onset  . Aneurysm Father 15  . Diabetes Sister     BP 138/87 mmHg  Pulse 67  Temp(Src) 98.4 F (36.9 C) (Oral)  Ht '5\' 8"'$  (1.727 m)  Wt 200 lb (90.719 kg)  BMI 30.42 kg/m2  SpO2 97%  Review of Systems denies blurry vision, headache, chest pain, sob, n/v, urinary frequency, muscle cramps, excessive diaphoresis, memory loss, rhinorrhea, and easy bruising.  He has lost 10 lbs. He has cold intolerance.      Objective:   Physical Exam VS: see vs page GEN: no distress HEAD: head: no deformity eyes: no periorbital swelling, no proptosis external nose and ears are normal mouth: no lesion seen NECK: supple, thyroid is not enlarged CHEST WALL: no deformity LUNGS: clear to auscultation BREASTS:  No gynecomastia CV: reg rate and  rhythm, no murmur ABD: abdomen is soft, nontender.  no hepatosplenomegaly.  not distended.  no hernia MUSCULOSKELETAL: muscle bulk and strength are grossly normal.  no obvious joint swelling.  gait is normal and steady EXTEMITIES: no deformity.  no ulcer on the feet.  feet are of normal color and temp.  no edema PULSES: dorsalis pedis intact bilat.  no carotid bruit NEURO:  cn 2-12 grossly intact.   readily moves all 4's.  sensation is intact to touch on the feet SKIN:  Normal texture and temperature.  No rash or suspicious lesion is visible.   NODES:  None palpable at the neck.   PSYCH: alert, well-oriented.  Does not appear anxious nor depressed.    Lab Results  Component Value Date   HGBA1C 12.5* 04/22/2015   i personally reviewed electrocardiogram tracing (04/22/15): Indication: DKA Impression: ST Lab  Results  Component Value Date   CREATININE 1.21 04/26/2015   BUN 20 04/26/2015   NA 141 04/26/2015   K 3.3* 04/26/2015   CL 109 04/26/2015   CO2 21* 04/26/2015       Assessment & Plan:  Type 1 DM, new, slightly overcontrolled.    Patient is advised the following: Patient Instructions  good diet and exercise significantly improve the control of your diabetes.  please let me know if you wish to be referred to a dietician.  high blood sugar is very risky to your health.  you should see an eye doctor and dentist every year.  It is very important to get all recommended vaccinations.  controlling your blood pressure and cholesterol drastically reduces the damage diabetes does to your body.  Those who smoke should quit.  please discuss these with your doctor.  check your blood sugar 3 times a day.  vary the time of day when you check, between before the 3 meals, and at bedtime.  also check if you have symptoms of your blood sugar being too high or too low.  please keep a record of the readings and bring it to your next appointment here (or you can bring the meter itself).  You can write it on any piece of paper.  please call us sooner if your blood sugar goes below 70, or if you have a lot of readings over 200. For now, please reduce the lantus to 25 units at bedtime.   Please come back for a follow-up appointment in 1 month.   Please see Vaughan Basta the same day, to consider an insulin pump.

## 2015-05-24 NOTE — Patient Instructions (Addendum)
good diet and exercise significantly improve the control of your diabetes.  please let me know if you wish to be referred to a dietician.  high blood sugar is very risky to your health.  you should see an eye doctor and dentist every year.  It is very important to get all recommended vaccinations.  controlling your blood pressure and cholesterol drastically reduces the damage diabetes does to your body.  Those who smoke should quit.  please discuss these with your doctor.  check your blood sugar 3 times a day.  vary the time of day when you check, between before the 3 meals, and at bedtime.  also check if you have symptoms of your blood sugar being too high or too low.  please keep a record of the readings and bring it to your next appointment here (or you can bring the meter itself).  You can write it on any piece of paper.  please call us sooner if your blood sugar goes below 70, or if you have a lot of readings over 200. For now, please reduce the lantus to 25 units at bedtime.   Please come back for a follow-up appointment in 1 month.   Please see Vaughan Basta the same day, to consider an insulin pump.

## 2015-06-28 ENCOUNTER — Encounter: Payer: Self-pay | Admitting: Nutrition

## 2015-06-28 ENCOUNTER — Encounter: Payer: Self-pay | Admitting: Endocrinology

## 2015-06-28 ENCOUNTER — Ambulatory Visit: Payer: Self-pay | Admitting: Endocrinology

## 2015-06-28 VITALS — BP 132/80 | HR 67 | Temp 98.4°F | Ht 68.0 in | Wt 210.0 lb

## 2015-06-28 DIAGNOSIS — E109 Type 1 diabetes mellitus without complications: Secondary | ICD-10-CM

## 2015-06-28 MED ORDER — BASAGLAR KWIKPEN 100 UNIT/ML ~~LOC~~ SOPN: 20.0000 [IU] | PEN_INJECTOR | Freq: Every day | SUBCUTANEOUS | Status: DC

## 2015-06-28 NOTE — Patient Instructions (Addendum)
check your blood sugar 3 times a day.  vary the time of day when you check, between before the 3 meals, and at bedtime.  also check if you have symptoms of your blood sugar being too high or too low.  please keep a record of the readings and bring it to your next appointment here (or you can bring the meter itself).  You can write it on any piece of paper.  please call us sooner if your blood sugar goes below 70, or if you have a lot of readings over 200. Please change the lantus to "basaglar," (same, except generic), 20 units at bedtime.   Please continue the same novolog.   Please come back for a follow-up appointment in 2 months.

## 2015-06-28 NOTE — Progress Notes (Signed)
Subjective:    Patient ID: Alec Jacobson, male    DOB: [birth year], 70 y.o.   MRN: [id number]  HPI  Pt returns for f/u of diabetes mellitus: DM type: 1 Dx'ed: 1638 Complications: none Therapy: insulin since dx DKA: never Severe hypoglycemia: never Pancreatitis: never Other: he takes multiple daily injections Interval history: he brings a record of his cbg's which i have reviewed today.  He checks fasting, and in the afternoon.  It varies from 73-200, but most are less than 100.   Past Medical History  Diagnosis Date  . H/O allergic rhinitis     job related  . Hypertension   . Chronic kidney disease     over 30 yrs ago, had one stone  . Cancer [facility])     prostate    Past Surgical History  Procedure Laterality Date  . Nasal sinus surgery    . Tonsillectomy    . Sinus endo with fusion Bilateral 04/13/2014    Procedure: SINUS ENDO WITH FUSION;  Surgeon: Ruby Cola, MD;  Location: [facility] OR;  Service: ENT;  Laterality: Bilateral;    Social History   Social History  . Marital Status: Married    Spouse Name: N/A  . Number of Children: N/A  . Years of Education: N/A   Occupational History  . Not on file.   Social History Main Topics  . Smoking status: Never Smoker   . Smokeless tobacco: Never Used  . Alcohol Use: No  . Drug Use: No  . Sexual Activity: Not on file   Other Topics Concern  . Not on file   Social History Narrative    Current Outpatient Prescriptions on File Prior to Visit  Medication Sig Dispense Refill  . amLODipine (NORVASC) 5 MG tablet Take 1 tablet (5 mg total) by mouth daily. 30 tablet 1  . blood glucose meter kit and supplies Dispense based on patient and insurance preference. Use up to four times daily as directed. (FOR ICD-9 250.00, 250.01). 1 each 0  . cyclobenzaprine (FLEXERIL) 10 MG tablet Take 10 mg by mouth 3 (three) times daily as needed for muscle spasms.    . insulin aspart (NOVOLOG FLEXPEN) 100 UNIT/ML FlexPen Inject 8 Units into  the skin 3 (three) times daily with meals. 15 mL 1  . Insulin Pen Needle 32G X 4 MM MISC Use with insulin pens to dispense insulin as directed 100 each 1  . rosuvastatin (CRESTOR) 20 MG tablet Take 20 mg by mouth daily.     No current facility-administered medications on file prior to visit.    No Known Allergies  Family History  Problem Relation Age of Onset  . Aneurysm Father 24  . Diabetes Sister     BP 132/80 mmHg  Pulse 67  Temp(Src) 98.4 F (36.9 C) (Oral)  Ht _0  (1.727 m)  Wt 210 lb (95.255 kg)  BMI 31.94 kg/m2  SpO2 97%  Review of Systems He denies hypoglycemia.      Objective:   Physical Exam VITAL SIGNS:  See vs page GENERAL: no distress SKIN:  Insulin injection sites at the anterior abdomen are normal.        Assessment & Plan:  Type 1 DM: slightly overcontrolled: he declines pump.   Patient is advised the following: Patient Instructions  check your blood sugar 3 times a day.  vary the time of day when you check, between before the 3 meals, and at bedtime.  also check if you have  symptoms of your blood sugar being too high or too low.  please keep a record of the readings and bring it to your next appointment here (or you can bring the meter itself).  You can write it on any piece of paper.  please call us sooner if your blood sugar goes below 70, or if you have a lot of readings over 200. Please change the lantus to "basaglar," (same, except generic), 20 units at bedtime.   Please continue the same novolog.   Please come back for a follow-up appointment in 2 months.

## 2015-08-30 ENCOUNTER — Encounter: Payer: Self-pay | Admitting: Endocrinology

## 2015-08-30 ENCOUNTER — Ambulatory Visit (INDEPENDENT_AMBULATORY_CARE_PROVIDER_SITE_OTHER): Payer: 59 | Admitting: Endocrinology

## 2015-08-30 ENCOUNTER — Encounter: Payer: 59 | Attending: Endocrinology | Admitting: Nutrition

## 2015-08-30 VITALS — BP 158/96 | HR 87 | Temp 98.5°F | Ht 68.0 in | Wt 210.0 lb

## 2015-08-30 DIAGNOSIS — E109 Type 1 diabetes mellitus without complications: Secondary | ICD-10-CM | POA: Diagnosis not present

## 2015-08-30 LAB — POCT GLYCOSYLATED HEMOGLOBIN (HGB A1C): Hemoglobin A1C: 5.7

## 2015-08-30 MED ORDER — INSULIN ASPART 100 UNIT/ML FLEXPEN: 6.0000 [IU] | PEN_INJECTOR | Freq: Three times a day (TID) | SUBCUTANEOUS | Status: DC

## 2015-08-30 MED ORDER — BASAGLAR KWIKPEN 100 UNIT/ML ~~LOC~~ SOPN: 15.0000 [IU] | PEN_INJECTOR | Freq: Every day | SUBCUTANEOUS | Status: DC

## 2015-08-30 NOTE — Progress Notes (Signed)
Subjective:    Patient ID: Alec Jacobson, male    DOB: January 09, 1946, 70 y.o.   MRN: [id number]  HPI Pt returns for f/u of diabetes mellitus: DM type: 1 Dx'ed: 2671 Complications: none Therapy: insulin since dx DKA: never Severe hypoglycemia: never Pancreatitis: never Other: he takes multiple daily injections Interval history: he brings a record of his cbg's which i have reviewed today.  He checks fasting, and in the afternoon.  It varies from 62-170, but most are approx 100.  There is no trend throughout the day.  Past Medical History  Diagnosis Date  . H/O allergic rhinitis     job related  . Hypertension   . Chronic kidney disease     over 30 yrs ago, had one stone  . Cancer [facility])     prostate    Past Surgical History  Procedure Laterality Date  . Nasal sinus surgery    . Tonsillectomy    . Sinus endo with fusion Bilateral 04/13/2014    Procedure: SINUS ENDO WITH FUSION;  Surgeon: Ruby Cola, MD;  Location: [facility] OR;  Service: ENT;  Laterality: Bilateral;    Social History   Social History  . Marital Status: Married    Spouse Name: N/A  . Number of Children: N/A  . Years of Education: N/A   Occupational History  . Not on file.   Social History Main Topics  . Smoking status: Never Smoker   . Smokeless tobacco: Never Used  . Alcohol Use: No  . Drug Use: No  . Sexual Activity: Not on file   Other Topics Concern  . Not on file   Social History Narrative    Current Outpatient Prescriptions on File Prior to Visit  Medication Sig Dispense Refill  . amLODipine (NORVASC) 5 MG tablet Take 1 tablet (5 mg total) by mouth daily. 30 tablet 1  . blood glucose meter kit and supplies Dispense based on patient and insurance preference. Use up to four times daily as directed. (FOR ICD-9 250.00, 250.01). 1 each 0  . cyclobenzaprine (FLEXERIL) 10 MG tablet Take 10 mg by mouth 3 (three) times daily as needed for muscle spasms.    . Insulin Pen Needle 32G X 4 MM MISC Use  with insulin pens to dispense insulin as directed 100 each 1  . rosuvastatin (CRESTOR) 20 MG tablet Take 20 mg by mouth daily.     No current facility-administered medications on file prior to visit.    No Known Allergies  Family History  Problem Relation Age of Onset  . Aneurysm Father 23  . Diabetes Sister     BP 158/96 mmHg  Pulse 87  Temp(Src) 98.5 F (36.9 C) (Oral)  Ht '5\' 8"'$  (1.727 m)  Wt 210 lb (95.255 kg)  BMI 31.94 kg/m2  SpO2 97%  Review of Systems Denies LOC    Objective:   Physical Exam VITAL SIGNS:  See vs page GENERAL: no distress Pulses: dorsalis pedis intact bilat.   MSK: no deformity of the feet CV: trace bilat leg edema Skin:  no ulcer on the feet.  normal color and temp on the feet. Neuro: sensation is intact to touch on the feet   A1c=5.7%    Assessment & Plan:  Type 1 DM: overcontrolled.  He may be in partial remission.  Patient is advised the following: Patient Instructions  check your blood sugar 3 times a day.  vary the time of day when you check, between before the 3  meals, and at bedtime.  also check if you have symptoms of your blood sugar being too high or too low.  please keep a record of the readings and bring it to your next appointment here (or you can bring the meter itself).  You can write it on any piece of paper.  please call us sooner if your blood sugar goes below 70, or if you have a lot of readings over 200. Please reduce the basaglar to 15 units at bedtime, and:  Please reduce the novolog to 6 units 3 times a day (just before each meal).   Please come back for a follow-up appointment in 3 months.    Renato Shin, MD

## 2015-08-30 NOTE — Patient Instructions (Addendum)
check your blood sugar 3 times a day.  vary the time of day when you check, between before the 3 meals, and at bedtime.  also check if you have symptoms of your blood sugar being too high or too low.  please keep a record of the readings and bring it to your next appointment here (or you can bring the meter itself).  You can write it on any piece of paper.  please call us sooner if your blood sugar goes below 70, or if you have a lot of readings over 200. Please reduce the basaglar to 15 units at bedtime, and:  Please reduce the novolog to 6 units 3 times a day (just before each meal).   Please come back for a follow-up appointment in 3 months.

## 2015-11-30 ENCOUNTER — Ambulatory Visit: Payer: Self-pay | Admitting: Endocrinology

## 2015-12-07 ENCOUNTER — Ambulatory Visit: Payer: Self-pay | Admitting: Endocrinology

## 2015-12-11 ENCOUNTER — Ambulatory Visit: Payer: Self-pay | Admitting: Endocrinology

## 2016-01-31 ENCOUNTER — Telehealth: Payer: Self-pay | Admitting: Endocrinology

## 2016-01-31 NOTE — Telephone Encounter (Signed)
Hi, got a bill for 06/28/2015 saying that no insurance was on file for this visit. Please verify your files to show [employer] as my insurance and file for payment due. I also have a No Show bill on a visit where I came at the time I was told and was told my appointment was at a different time. I don't feel I should pay this but I will because I came when I was told my appointment would be. Thanks    LM for pt to call back, I have requested for the 06/28/15 DOS to be refiled and the DOS 12/11/15 NS fee to be removed.

## 2016-02-27 ENCOUNTER — Encounter: Payer: Self-pay | Admitting: Endocrinology

## 2016-02-27 ENCOUNTER — Ambulatory Visit (INDEPENDENT_AMBULATORY_CARE_PROVIDER_SITE_OTHER): Payer: Commercial Managed Care - HMO | Admitting: Endocrinology

## 2016-02-27 VITALS — BP 126/78 | HR 62 | Ht 68.0 in | Wt 211.0 lb

## 2016-02-27 DIAGNOSIS — E119 Type 2 diabetes mellitus without complications: Secondary | ICD-10-CM | POA: Diagnosis not present

## 2016-02-27 LAB — POCT GLYCOSYLATED HEMOGLOBIN (HGB A1C): Hemoglobin A1C: 6.3

## 2016-02-27 MED ORDER — BASAGLAR KWIKPEN 100 UNIT/ML ~~LOC~~ SOPN: 12.0000 [IU] | PEN_INJECTOR | Freq: Every day | SUBCUTANEOUS | 11 refills | Status: DC

## 2016-02-27 MED ORDER — INSULIN ASPART 100 UNIT/ML FLEXPEN: 6.0000 [IU] | PEN_INJECTOR | Freq: Two times a day (BID) | SUBCUTANEOUS | 1 refills | Status: DC

## 2016-02-27 NOTE — Progress Notes (Signed)
Subjective:    Patient ID: Alec Jacobson, male    DOB: Sep 23, 1945, 70 y.o.   MRN: [id number]  HPI Pt returns for f/u of diabetes mellitus: DM type: 1 Dx'ed: 0623 Complications: none Therapy: insulin since dx DKA: never, but he had nonketotic hyperosmolar hyperglycemic state in 2017 Severe hypoglycemia: never Pancreatitis: never Other: he takes multiple daily injections Interval history: no cbg record, but states cbg's vary from 60-100's.  It is lowest in the afternoon. Past Medical History:  Diagnosis Date  . Cancer [facility])    prostate  . Chronic kidney disease    over 30 yrs ago, had one stone  . H/O allergic rhinitis    job related  . Hypertension     Past Surgical History:  Procedure Laterality Date  . NASAL SINUS SURGERY    . SINUS ENDO WITH FUSION Bilateral 04/13/2014   Procedure: SINUS ENDO WITH FUSION;  Surgeon: Ruby Cola, MD;  Location: [place];  Service: ENT;  Laterality: Bilateral;  . TONSILLECTOMY      Social History   Social History  . Marital status: Married    Spouse name: N/A  . Number of children: N/A  . Years of education: N/A   Occupational History  . Not on file.   Social History Main Topics  . Smoking status: Never Smoker  . Smokeless tobacco: Never Used  . Alcohol use No  . Drug use: No  . Sexual activity: Not on file   Other Topics Concern  . Not on file   Social History Narrative  . No narrative on file    Current Outpatient Prescriptions on File Prior to Visit  Medication Sig Dispense Refill  . amLODipine (NORVASC) 5 MG tablet Take 1 tablet (5 mg total) by mouth daily. 30 tablet 1  . blood glucose meter kit and supplies Dispense based on patient and insurance preference. Use up to four times daily as directed. (FOR ICD-9 250.00, 250.01). 1 each 0  . cyclobenzaprine (FLEXERIL) 10 MG tablet Take 10 mg by mouth 3 (three) times daily as needed for muscle spasms.    . Insulin Pen Needle 32G X 4 MM MISC Use with insulin pens to  dispense insulin as directed 100 each 1  . rosuvastatin (CRESTOR) 20 MG tablet Take 20 mg by mouth daily.     No current facility-administered medications on file prior to visit.     No Known Allergies  Family History  Problem Relation Age of Onset  . Aneurysm Father 50  . Diabetes Sister     BP 126/78   Pulse 62   Ht '5\' 8"'$  (1.727 m)   Wt 211 lb (95.7 kg)   SpO2 97%   BMI 32.08 kg/m    Review of Systems Denies LOC    Objective:   Physical Exam VITAL SIGNS:  See vs page GENERAL: no distress Pulses: dorsalis pedis intact bilat.   MSK: no deformity of the feet CV: trace bilat leg edema Skin:  no ulcer on the feet.  normal color and temp on the feet. Neuro: sensation is intact to touch on the feet  A1c=6.3%    Assessment & Plan:  Type 1 DM: overcontrolled.  He may be in partial remission.   Patient is advised the following: Patient Instructions  check your blood sugar 3 times a day.  vary the time of day when you check, between before the 3 meals, and at bedtime.  also check if you have symptoms of your  blood sugar being too high or too low.  please keep a record of the readings and bring it to your next appointment here (or you can bring the meter itself).  You can write it on any piece of paper.  please call us sooner if your blood sugar goes below 70, or if you have a lot of readings over 200. Please reduce the basaglar to 12 units at bedtime, and:  take the novolog, 6 units twice a day (breakfast and supper) Please come back for a follow-up appointment in 3 months.

## 2016-02-27 NOTE — Patient Instructions (Addendum)
check your blood sugar 3 times a day.  vary the time of day when you check, between before the 3 meals, and at bedtime.  also check if you have symptoms of your blood sugar being too high or too low.  please keep a record of the readings and bring it to your next appointment here (or you can bring the meter itself).  You can write it on any piece of paper.  please call us sooner if your blood sugar goes below 70, or if you have a lot of readings over 200. Please reduce the basaglar to 12 units at bedtime, and:  take the novolog, 6 units twice a day (breakfast and supper) Please come back for a follow-up appointment in 3 months.

## 2016-03-07 ENCOUNTER — Telehealth: Payer: Self-pay | Admitting: Endocrinology

## 2016-05-27 ENCOUNTER — Ambulatory Visit (INDEPENDENT_AMBULATORY_CARE_PROVIDER_SITE_OTHER): Payer: Commercial Managed Care - HMO | Admitting: Endocrinology

## 2016-05-27 VITALS — BP 134/84 | HR 108 | Ht 68.0 in | Wt 211.0 lb

## 2016-05-27 DIAGNOSIS — E109 Type 1 diabetes mellitus without complications: Secondary | ICD-10-CM

## 2016-05-27 LAB — POCT GLYCOSYLATED HEMOGLOBIN (HGB A1C): HEMOGLOBIN A1C: 6.5

## 2016-05-27 NOTE — Progress Notes (Signed)
Subjective:    Patient ID: Alec Jacobson, male    DOB: 22-Mar-1946, 71 y.o.   MRN: [id number]  HPI Pt returns for f/u of diabetes mellitus: DM type: 1 Dx'ed: 1572 Complications: none Therapy: insulin since dx DKA: never, but he had nonketotic hyperosmolar hyperglycemic state in 2017.  Severe hypoglycemia: never Pancreatitis: never.  Other: he takes multiple daily injections.   Interval history: He brings a record of his cbg's which I have reviewed today.  It is in general lowest in the afternoon.  pt states he feels well in general. Past Medical History:  Diagnosis Date  . Cancer [facility])    prostate  . Chronic kidney disease    over 30 yrs ago, had one stone  . H/O allergic rhinitis    job related  . Hypertension     Past Surgical History:  Procedure Laterality Date  . NASAL SINUS SURGERY    . SINUS ENDO WITH FUSION Bilateral 04/13/2014   Procedure: SINUS ENDO WITH FUSION;  Surgeon: Ruby Cola, MD;  Location: [place];  Service: ENT;  Laterality: Bilateral;  . TONSILLECTOMY      Social History   Social History  . Marital status: Married    Spouse name: N/A  . Number of children: N/A  . Years of education: N/A   Occupational History  . Not on file.   Social History Main Topics  . Smoking status: Never Smoker  . Smokeless tobacco: Never Used  . Alcohol use No  . Drug use: No  . Sexual activity: Not on file   Other Topics Concern  . Not on file   Social History Narrative  . No narrative on file    Current Outpatient Prescriptions on File Prior to Visit  Medication Sig Dispense Refill  . amLODipine (NORVASC) 5 MG tablet Take 1 tablet (5 mg total) by mouth daily. 30 tablet 1  . blood glucose meter kit and supplies Dispense based on patient and insurance preference. Use up to four times daily as directed. (FOR ICD-9 250.00, 250.01). 1 each 0  . cyclobenzaprine (FLEXERIL) 10 MG tablet Take 10 mg by mouth 3 (three) times daily as needed for muscle spasms.    .  insulin aspart (NOVOLOG FLEXPEN) 100 UNIT/ML FlexPen Inject 6 Units into the skin 2 (two) times daily with a meal. 15 mL 1  . Insulin Glargine (BASAGLAR KWIKPEN) 100 UNIT/ML SOPN Inject 0.12 mLs (12 Units total) into the skin at bedtime. 5 pen 11  . Insulin Pen Needle 32G X 4 MM MISC Use with insulin pens to dispense insulin as directed 100 each 1  . rosuvastatin (CRESTOR) 20 MG tablet Take 20 mg by mouth daily.     No current facility-administered medications on file prior to visit.     No Known Allergies  Family History  Problem Relation Age of Onset  . Aneurysm Father 69  . Diabetes Sister     BP 134/84   Pulse (!) 108   Ht 5' 8" (1.727 m)   Wt 211 lb (95.7 kg)   SpO2 95%   BMI 32.08 kg/m    Review of Systems He denies hypoglycemia.      Objective:   Physical Exam VITAL SIGNS:  See vs page.  GENERAL: no distress.  Pulses: dorsalis pedis intact bilat.   MSK: no deformity of the feet.  CV: trace bilat leg edema.  Skin:  no ulcer on the feet.  normal color and temp on the feet.  Neuro: sensation is intact to touch on the feet.     A1c=6.5%    Assessment & Plan:  Type 1 DM: well-controlled  Patient is advised the following: Patient Instructions  check your blood sugar 3 times a day.  vary the time of day when you check, between before the 3 meals, and at bedtime.  also check if you have symptoms of your blood sugar being too high or too low.  please keep a record of the readings and bring it to your next appointment here (or you can bring the meter itself).  You can write it on any piece of paper.  please call us sooner if your blood sugar goes below 70, or if you have a lot of readings over 200. Please continue the same insulins.   You should consider using an insulin pump and continuous glucose monitor.  Please let us know if you want to see Vaughan Basta, to learn more.   Please come back for a follow-up appointment in 3-4 months.

## 2016-05-27 NOTE — Patient Instructions (Addendum)
check your blood sugar 3 times a day.  vary the time of day when you check, between before the 3 meals, and at bedtime.  also check if you have symptoms of your blood sugar being too high or too low.  please keep a record of the readings and bring it to your next appointment here (or you can bring the meter itself).  You can write it on any piece of paper.  please call us sooner if your blood sugar goes below 70, or if you have a lot of readings over 200. Please continue the same insulins.   You should consider using an insulin pump and continuous glucose monitor.  Please let us know if you want to see Vaughan Basta, to learn more.   Please come back for a follow-up appointment in 3-4 months.

## 2016-08-21 LAB — HM DIABETES EYE EXAM

## 2016-09-27 ENCOUNTER — Encounter: Payer: Self-pay | Admitting: Endocrinology

## 2016-09-27 ENCOUNTER — Ambulatory Visit (INDEPENDENT_AMBULATORY_CARE_PROVIDER_SITE_OTHER): Payer: 59 | Admitting: Endocrinology

## 2016-09-27 VITALS — BP 134/86 | HR 57 | Ht 68.0 in | Wt 211.0 lb

## 2016-09-27 DIAGNOSIS — R4182 Altered mental status, unspecified: Secondary | ICD-10-CM

## 2016-09-27 DIAGNOSIS — E119 Type 2 diabetes mellitus without complications: Secondary | ICD-10-CM | POA: Diagnosis not present

## 2016-09-27 LAB — POCT GLYCOSYLATED HEMOGLOBIN (HGB A1C): Hemoglobin A1C: 6.5

## 2016-09-27 NOTE — Patient Instructions (Addendum)
check your blood sugar 3 times a day.  vary the time of day when you check, between before the 3 meals, and at bedtime.  also check if you have symptoms of your blood sugar being too high or too low.  please keep a record of the readings and bring it to your next appointment here (or you can bring the meter itself).  You can write it on any piece of paper.  please call us sooner if your blood sugar goes below 70, or if you have a lot of readings over 200. Please continue the same insulins.   Please come back for a follow-up appointment in 4 months.

## 2016-09-27 NOTE — Progress Notes (Signed)
Subjective:    Patient ID: Alec Jacobson, male    DOB: Nov 24, 1945, 71 y.o.   MRN: [id number]  HPI Pt returns for f/u of diabetes mellitus: DM type: Insulin-requiring type 2 DM.   Dx'ed: 3300 Complications: renal insuff.  Therapy: insulin since dx.  DKA: never, but he had nonketotic hyperosmolar hyperglycemic state in 2017.   Severe hypoglycemia: never.  Pancreatitis: never.  Other: he takes multiple daily injections; he declines pump rx.  Interval history: no cbg record, but states cbg's are mildly low approx once every few mos.  There is no trend throughout the day.   Past Medical History:  Diagnosis Date  . Cancer [facility])    prostate  . Chronic kidney disease    over 30 yrs ago, had one stone  . H/O allergic rhinitis    job related  . Hypertension     Past Surgical History:  Procedure Laterality Date  . NASAL SINUS SURGERY    . SINUS ENDO WITH FUSION Bilateral 04/13/2014   Procedure: SINUS ENDO WITH FUSION;  Surgeon: Ruby Cola, MD;  Location: [place];  Service: ENT;  Laterality: Bilateral;  . TONSILLECTOMY      Social History   Social History  . Marital status: Married    Spouse name: N/A  . Number of children: N/A  . Years of education: N/A   Occupational History  . Not on file.   Social History Main Topics  . Smoking status: Never Smoker  . Smokeless tobacco: Never Used  . Alcohol use No  . Drug use: No  . Sexual activity: Not on file   Other Topics Concern  . Not on file   Social History Narrative  . No narrative on file    Current Outpatient Prescriptions on File Prior to Visit  Medication Sig Dispense Refill  . amLODipine (NORVASC) 5 MG tablet Take 1 tablet (5 mg total) by mouth daily. 30 tablet 1  . blood glucose meter kit and supplies Dispense based on patient and insurance preference. Use up to four times daily as directed. (FOR ICD-9 250.00, 250.01). 1 each 0  . insulin aspart (NOVOLOG FLEXPEN) 100 UNIT/ML FlexPen Inject 6 Units into the  skin 2 (two) times daily with a meal. 15 mL 1  . Insulin Glargine (BASAGLAR KWIKPEN) 100 UNIT/ML SOPN Inject 0.12 mLs (12 Units total) into the skin at bedtime. 5 pen 11  . Insulin Pen Needle 32G X 4 MM MISC Use with insulin pens to dispense insulin as directed 100 each 1  . rosuvastatin (CRESTOR) 20 MG tablet Take 20 mg by mouth daily.     No current facility-administered medications on file prior to visit.     No Known Allergies  Family History  Problem Relation Age of Onset  . Aneurysm Father 46  . Diabetes Sister     BP 134/86   Pulse (!) 57   Ht '5\' 8"'$  (1.727 m)   Wt 211 lb (95.7 kg)   SpO2 96%   BMI 32.08 kg/m    Review of Systems He has 18 mos of aversion to being in a closed space.  He says this started after Jan, 2017 episode of nonketotic hyperosmolar hyperglycemic state.      Objective:   Physical Exam VITAL SIGNS:  See vs page.  GENERAL: no distress.  Pulses: dorsalis pedis intact bilat.   MSK: no deformity of the feet.  CV: trace bilat leg edema.  Skin:  no ulcer on the feet.  normal color and temp on the feet.  Neuro: sensation is intact to touch on the feet.   A1c=6.5%     Assessment & Plan:  Insulin-requiring type 2 DM, with renal insuff: well-controlled. Anxiety: new.  Refer psychology.    Patient Instructions  check your blood sugar 3 times a day.  vary the time of day when you check, between before the 3 meals, and at bedtime.  also check if you have symptoms of your blood sugar being too high or too low.  please keep a record of the readings and bring it to your next appointment here (or you can bring the meter itself).  You can write it on any piece of paper.  please call us sooner if your blood sugar goes below 70, or if you have a lot of readings over 200. Please continue the same insulins.   Please come back for a follow-up appointment in 4 months.

## 2016-11-07 ENCOUNTER — Other Ambulatory Visit: Payer: Self-pay | Admitting: Endocrinology

## 2017-01-28 ENCOUNTER — Ambulatory Visit (INDEPENDENT_AMBULATORY_CARE_PROVIDER_SITE_OTHER): Payer: 59 | Admitting: Endocrinology

## 2017-01-28 ENCOUNTER — Encounter: Payer: Self-pay | Admitting: Endocrinology

## 2017-01-28 VITALS — BP 172/98 | HR 91 | Wt 213.4 lb

## 2017-01-28 DIAGNOSIS — E109 Type 1 diabetes mellitus without complications: Secondary | ICD-10-CM | POA: Diagnosis not present

## 2017-01-28 DIAGNOSIS — E119 Type 2 diabetes mellitus without complications: Secondary | ICD-10-CM | POA: Diagnosis not present

## 2017-01-28 LAB — POCT GLYCOSYLATED HEMOGLOBIN (HGB A1C): Hemoglobin A1C: 5.8

## 2017-01-28 MED ORDER — INSULIN ASPART 100 UNIT/ML FLEXPEN: 5.0000 [IU] | PEN_INJECTOR | Freq: Two times a day (BID) | SUBCUTANEOUS | 1 refills | Status: DC

## 2017-01-28 MED ORDER — BASAGLAR KWIKPEN 100 UNIT/ML ~~LOC~~ SOPN: 8.0000 [IU] | PEN_INJECTOR | Freq: Every day | SUBCUTANEOUS | 11 refills | Status: DC

## 2017-01-28 NOTE — Progress Notes (Signed)
 Subjective:    Patient ID: Alec Jacobson, male    DOB: 04/04/1945, 71 y.o.   MRN: [id number]  HPI Pt returns for f/u of diabetes mellitus:  DM type: Insulin-requiring type 2 DM.   Dx'ed: 2017.  Complications: renal insuff.  Therapy: insulin since dx.  DKA: never, but he had nonketotic hyperosmolar hyperglycemic state in 2017.   Severe hypoglycemia: never.   Pancreatitis: never.   Other: he takes multiple daily injections; he declines pump rx.  Interval history: no cbg record, but states cbg's are well-controlled.  There is no trend throughout the day.   Past Medical History:  Diagnosis Date  . Cancer (HCC)    prostate  . Chronic kidney disease    over 30 yrs ago, had one stone  . H/O allergic rhinitis    job related  . Hypertension     Past Surgical History:  Procedure Laterality Date  . NASAL SINUS SURGERY    . TONSILLECTOMY      Social History   Socioeconomic History  . Marital status: Married    Spouse name: Not on file  . Number of children: Not on file  . Years of education: Not on file  . Highest education level: Not on file  Social Needs  . Financial resource strain: Not on file  . Food insecurity - worry: Not on file  . Food insecurity - inability: Not on file  . Transportation needs - medical: Not on file  . Transportation needs - non-medical: Not on file  Occupational History  . Not on file  Tobacco Use  . Smoking status: Never Smoker  . Smokeless tobacco: Never Used  Substance and Sexual Activity  . Alcohol use: No  . Drug use: No  . Sexual activity: Not on file  Other Topics Concern  . Not on file  Social History Narrative  . Not on file    Current Outpatient Medications on File Prior to Visit  Medication Sig Dispense Refill  . amLODipine (NORVASC) 5 MG tablet Take 1 tablet (5 mg total) by mouth daily. 30 tablet 1  . blood glucose meter kit and supplies Dispense based on patient and insurance preference. Use up to four times daily as  directed. (FOR ICD-9 250.00, 250.01). 1 each 0  . Insulin Pen Needle 32G X 4 MM MISC Use with insulin pens to dispense insulin as directed 100 each 1  . rosuvastatin (CRESTOR) 20 MG tablet Take 20 mg by mouth daily.     No current facility-administered medications on file prior to visit.     No Known Allergies  Family History  Problem Relation Age of Onset  . Aneurysm Father 89  . Diabetes Sister     BP (!) 172/98 (BP Location: Right Arm, Patient Position: Sitting, Cuff Size: Normal)   Pulse 91   Wt 213 lb 6.4 oz (96.8 kg)   SpO2 97%   BMI 32.45 kg/m    Review of Systems He denies hypoglycemia.      Objective:   Physical Exam VITAL SIGNS:  See vs page GENERAL: no distress Pulses: foot pulses are intact bilaterally.   MSK: no deformity of the feet or ankles.  CV: no edema of the legs or ankles.  Skin:  no ulcer on the feet or ankles.  normal color and temp on the feet and ankles.  Neuro: sensation is intact to touch on the feet and ankles.    Lab Results  Component Value Date     HGBA1C 5.8 01/28/2017      Assessment & Plan:  Insulin-requiring type 2 DM: overcontrolled.   Patient Instructions  check your blood sugar 3 times a day.  vary the time of day when you check, between before the 3 meals, and at bedtime.  also check if you have symptoms of your blood sugar being too high or too low.  please keep a record of the readings and bring it to your next appointment here (or you can bring the meter itself).  You can write it on any piece of paper.  please call us sooner if your blood sugar goes below 70, or if you have a lot of readings over 200. Please reduce the insulins to the numbers listed below.   Please come back for a follow-up appointment in 3 months.       

## 2017-01-28 NOTE — Patient Instructions (Addendum)
check your blood sugar 3 times a day.  vary the time of day when you check, between before the 3 meals, and at bedtime.  also check if you have symptoms of your blood sugar being too high or too low.  please keep a record of the readings and bring it to your next appointment here (or you can bring the meter itself).  You can write it on any piece of paper.  please call us sooner if your blood sugar goes below 70, or if you have a lot of readings over 200. Please reduce the insulins to the numbers listed below.   Please come back for a follow-up appointment in 3 months.

## 2017-04-01 ENCOUNTER — Other Ambulatory Visit: Payer: Self-pay | Admitting: Endocrinology

## 2017-04-30 ENCOUNTER — Encounter: Payer: Self-pay | Admitting: Endocrinology

## 2017-04-30 ENCOUNTER — Ambulatory Visit: Payer: Medicare Other | Admitting: Endocrinology

## 2017-04-30 VITALS — BP 140/86 | HR 68 | Ht 68.0 in | Wt 212.0 lb

## 2017-04-30 DIAGNOSIS — E119 Type 2 diabetes mellitus without complications: Secondary | ICD-10-CM

## 2017-04-30 LAB — POCT GLYCOSYLATED HEMOGLOBIN (HGB A1C): HEMOGLOBIN A1C: 8

## 2017-04-30 MED ORDER — BASAGLAR KWIKPEN 100 UNIT/ML ~~LOC~~ SOPN: 10.0000 [IU] | PEN_INJECTOR | Freq: Every day | SUBCUTANEOUS | 11 refills | Status: DC

## 2017-04-30 NOTE — Progress Notes (Signed)
Subjective:    Patient ID: Alec Jacobson, male    DOB: [birth year], 72 y.o.   MRN: [id number]  HPI Pt returns for f/u of diabetes mellitus:  DM type: 1  Dx'ed: 2017.  Complications: renal insuff.  Therapy: insulin since dx.  DKA: mild, in 2017.   Severe hypoglycemia: never.   Pancreatitis: never.   Other: he takes multiple daily injections; he declines pump rx.  Interval history: no cbg record, but states cbg's are well-controlled.  He says it is highest fasting, and lowest in the afternoon. pt states he feels well in general. Past Medical History:  Diagnosis Date  . Cancer [facility])    prostate  . Chronic kidney disease    over 30 yrs ago, had one stone  . H/O allergic rhinitis    job related  . Hypertension     Past Surgical History:  Procedure Laterality Date  . NASAL SINUS SURGERY    . SINUS ENDO WITH FUSION Bilateral 04/13/2014   Procedure: SINUS ENDO WITH FUSION;  Surgeon: Ruby Cola, MD;  Location: [place];  Service: ENT;  Laterality: Bilateral;  . TONSILLECTOMY      Social History   Socioeconomic History  . Marital status: Married    Spouse name: Not on file  . Number of children: Not on file  . Years of education: Not on file  . Highest education level: Not on file  Social Needs  . Financial resource strain: Not on file  . Food insecurity - worry: Not on file  . Food insecurity - inability: Not on file  . Transportation needs - medical: Not on file  . Transportation needs - non-medical: Not on file  Occupational History  . Not on file  Tobacco Use  . Smoking status: Never Smoker  . Smokeless tobacco: Never Used  Substance and Sexual Activity  . Alcohol use: No  . Drug use: No  . Sexual activity: Not on file  Other Topics Concern  . Not on file  Social History Narrative  . Not on file    Current Outpatient Medications on File Prior to Visit  Medication Sig Dispense Refill  . amLODipine (NORVASC) 5 MG tablet Take 1 tablet (5 mg total) by mouth  daily. 30 tablet 1  . blood glucose meter kit and supplies Dispense based on patient and insurance preference. Use up to four times daily as directed. (FOR ICD-9 250.00, 250.01). 1 each 0  . insulin aspart (NOVOLOG FLEXPEN) 100 UNIT/ML FlexPen Inject 5 Units 2 (two) times daily with a meal into the skin. 15 mL 1  . Insulin Pen Needle 32G X 4 MM MISC Use with insulin pens to dispense insulin as directed 100 each 1  . rosuvastatin (CRESTOR) 20 MG tablet Take 20 mg by mouth daily.     No current facility-administered medications on file prior to visit.     No Known Allergies  Family History  Problem Relation Age of Onset  . Aneurysm Father 88  . Diabetes Sister     BP 140/86   Pulse 68   Ht '5\' 8"'$  (1.727 m)   Wt 212 lb (96.2 kg)   SpO2 97%   BMI 32.23 kg/m    Review of Systems He denies hypoglycemia.      Objective:   Physical Exam VITAL SIGNS:  See vs page GENERAL: no distress Pulses: dorsalis pedis intact bilat.   MSK: no deformity of the feet CV: no leg edema Skin:  no ulcer  on the feet.  normal color and temp on the feet. Neuro: sensation is intact to touch on the feet  Lab Results  Component Value Date   HGBA1C 8.0 04/30/2017       Assessment & Plan:  Type 1 DM: worse H/o DKA.  We discussed.  He should stay on the insulin.  Pt says ok.    Patient Instructions  check your blood sugar 3 times a day.  vary the time of day when you check, between before the 3 meals, and at bedtime.  also check if you have symptoms of your blood sugar being too high or too low.  please keep a record of the readings and bring it to your next appointment here (or you can bring the meter itself).  You can write it on any piece of paper.  please call us sooner if your blood sugar goes below 70, or if you have a lot of readings over 200. Please increase the basaglar to 10 units at bedtime, and: Please continue the same novolog. Please come back for a follow-up appointment in 3 months.

## 2017-04-30 NOTE — Patient Instructions (Addendum)
check your blood sugar 3 times a day.  vary the time of day when you check, between before the 3 meals, and at bedtime.  also check if you have symptoms of your blood sugar being too high or too low.  please keep a record of the readings and bring it to your next appointment here (or you can bring the meter itself).  You can write it on any piece of paper.  please call us sooner if your blood sugar goes below 70, or if you have a lot of readings over 200. Please increase the basaglar to 10 units at bedtime, and: Please continue the same novolog. Please come back for a follow-up appointment in 3 months.

## 2017-05-08 ENCOUNTER — Encounter: Payer: Self-pay | Admitting: Endocrinology

## 2017-05-23 LAB — HM DIABETES EYE EXAM

## 2017-07-28 ENCOUNTER — Encounter: Payer: Self-pay | Admitting: Endocrinology

## 2017-07-28 ENCOUNTER — Ambulatory Visit (INDEPENDENT_AMBULATORY_CARE_PROVIDER_SITE_OTHER): Payer: Medicare Other | Admitting: Endocrinology

## 2017-07-28 VITALS — BP 140/80 | HR 89 | Ht 68.0 in | Wt 196.0 lb

## 2017-07-28 DIAGNOSIS — E119 Type 2 diabetes mellitus without complications: Secondary | ICD-10-CM | POA: Diagnosis not present

## 2017-07-28 LAB — POCT GLYCOSYLATED HEMOGLOBIN (HGB A1C): Hemoglobin A1C: 14

## 2017-07-28 MED ORDER — BASAGLAR KWIKPEN 100 UNIT/ML ~~LOC~~ SOPN: 30.0000 [IU] | PEN_INJECTOR | SUBCUTANEOUS | 11 refills | Status: DC

## 2017-07-28 NOTE — Progress Notes (Signed)
 Subjective:    Patient ID: Alec Jacobson, male    DOB: 12/17/1945, 72 y.o.   MRN: [id number]  HPI Pt returns for f/u of diabetes mellitus:  DM type: 1  Dx'ed: 2017.  Complications: renal insuff.  Therapy: insulin since dx.  DKA: mild, in 2017.   Severe hypoglycemia: never.   Pancreatitis: never.   Other: he takes multiple daily injections; he declines pump rx.  Interval history: no cbg record, but states cbg's are in the 200's.  He has lost weight.  Pt says he never misses the insulin.  Pt says he can easily afford the insulin.   Past Medical History:  Diagnosis Date  . Cancer ([facility])    prostate  . Chronic kidney disease    over 30 yrs ago, had one stone  . H/O allergic rhinitis    job related  . Hypertension     Past Surgical History:  Procedure Laterality Date  . NASAL SINUS SURGERY    . SINUS ENDO WITH FUSION Bilateral 04/13/2014   Procedure: SINUS ENDO WITH FUSION;  Surgeon: Mitchell Gore, MD;  Location: [facility];  Service: ENT;  Laterality: Bilateral;  . TONSILLECTOMY      Social History   Socioeconomic History  . Marital status: Married    Spouse name: Not on file  . Number of children: Not on file  . Years of education: Not on file  . Highest education level: Not on file  Occupational History  . Not on file  Social Needs  . Financial resource strain: Not on file  . Food insecurity:    Worry: Not on file    Inability: Not on file  . Transportation needs:    Medical: Not on file    Non-medical: Not on file  Tobacco Use  . Smoking status: Never Smoker  . Smokeless tobacco: Never Used  Substance and Sexual Activity  . Alcohol use: No  . Drug use: No  . Sexual activity: Not on file  Lifestyle  . Physical activity:    Days per week: Not on file    Minutes per session: Not on file  . Stress: Not on file  Relationships  . Social connections:    Talks on phone: Not on file    Gets together: Not on file    Attends religious service: Not on file   Active member of club or organization: Not on file    Attends meetings of clubs or organizations: Not on file    Relationship status: Not on file  . Intimate partner violence:    Fear of current or ex partner: Not on file    Emotionally abused: Not on file    Physically abused: Not on file    Forced sexual activity: Not on file  Other Topics Concern  . Not on file  Social History Narrative  . Not on file    Current Outpatient Medications on File Prior to Visit  Medication Sig Dispense Refill  . amLODipine (NORVASC) 5 MG tablet Take 1 tablet (5 mg total) by mouth daily. 30 tablet 1  . blood glucose meter kit and supplies Dispense based on patient and insurance preference. Use up to four times daily as directed. (FOR ICD-9 250.00, 250.01). 1 each 0  . Insulin Pen Needle 32G X 4 MM MISC Use with insulin pens to dispense insulin as directed 100 each 1  . rosuvastatin (CRESTOR) 20 MG tablet Take 20 mg by mouth daily.       No current facility-administered medications on file prior to visit.     No Known Allergies  Family History  Problem Relation Age of Onset  . Aneurysm Father 89  . Diabetes Sister     BP 140/80 (BP Location: Left Arm, Patient Position: Sitting, Cuff Size: Normal)   Pulse 89   Ht 5' 8" (1.727 m)   Wt 196 lb (88.9 kg)   SpO2 97%   BMI 29.80 kg/m    Review of Systems He denies hypoglycemia.      Objective:   Physical Exam VITAL SIGNS:  See vs page.  GENERAL: no distress.  Pulses: foot pulses are intact bilaterally.   MSK: no deformity of the feet or ankles.  CV: no edema of the legs or ankles.   Skin:  no ulcer on the feet or ankles.  normal color and temp on the feet and ankles.   Neuro: sensation is intact to touch on the feet and ankles.    Lab Results  Component Value Date   HGBA1C 14.0 07/28/2017       Assessment & Plan:  Insulin-requiring type 2 DM: much worse Weight loss, prob due to severe hyperglycemia: we'll follow.   Patient  Instructions  check your blood sugar 3 times a day.  vary the time of day when you check, between before the 3 meals, and at bedtime.  also check if you have symptoms of your blood sugar being too high or too low.  please keep a record of the readings and bring it to your next appointment here (or you can bring the meter itself).  You can write it on any piece of paper.  please call us sooner if your blood sugar goes below 70, or if you have a lot of readings over 200. Please increase the basaglar to 30 units each morning, and: take novolog 5 units only when the blood sugar is over 300. Please come back for a follow-up appointment in 2 months.         

## 2017-07-28 NOTE — Patient Instructions (Addendum)
check your blood sugar 3 times a day.  vary the time of day when you check, between before the 3 meals, and at bedtime.  also check if you have symptoms of your blood sugar being too high or too low.  please keep a record of the readings and bring it to your next appointment here (or you can bring the meter itself).  You can write it on any piece of paper.  please call us sooner if your blood sugar goes below 70, or if you have a lot of readings over 200. Please increase the basaglar to 30 units each morning, and: take novolog 5 units only when the blood sugar is over 300. Please come back for a follow-up appointment in 2 months.

## 2017-10-06 ENCOUNTER — Encounter: Payer: Self-pay | Admitting: Endocrinology

## 2017-10-06 ENCOUNTER — Ambulatory Visit: Payer: Medicare Other | Admitting: Endocrinology

## 2017-10-06 VITALS — BP 138/90 | HR 67 | Ht 68.0 in | Wt 208.4 lb

## 2017-10-06 DIAGNOSIS — E109 Type 1 diabetes mellitus without complications: Secondary | ICD-10-CM | POA: Diagnosis not present

## 2017-10-06 LAB — POCT GLYCOSYLATED HEMOGLOBIN (HGB A1C): HEMOGLOBIN A1C: 7.5 % — AB (ref 4.0–5.6)

## 2017-10-06 NOTE — Patient Instructions (Addendum)
check your blood sugar twice a day.  vary the time of day when you check, between before the 3 meals, and at bedtime.  also check if you have symptoms of your blood sugar being too high or too low.  please keep a record of the readings and bring it to your next appointment here (or you can bring the meter itself).  You can write it on any piece of paper.  please call us sooner if your blood sugar goes below 70, or if you have a lot of readings over 200. Please continue the same medications basaglar. Please come back for a follow-up appointment in 3-4 months.

## 2017-10-06 NOTE — Progress Notes (Signed)
Subjective:    Patient ID: Alec Jacobson, male    DOB: 09-26-1945, 72 y.o.   MRN: [id number]  HPI Pt returns for f/u of diabetes mellitus:  DM type: 1  Dx'ed: 2017.  Complications: renal insuff.  Therapy: insulin since dx.  DKA: mild, in 2017.   Severe hypoglycemia: never.   Pancreatitis: never.   Other: he takes multiple daily injections; he declines pump rx.  Interval history: no cbg record, but states cbg's are in the 100's. Pt says he never misses the basaglar.  He does not take the novolog.   Past Medical History:  Diagnosis Date  . Cancer [facility])    prostate  . Chronic kidney disease    over 30 yrs ago, had one stone  . H/O allergic rhinitis    job related  . Hypertension     Past Surgical History:  Procedure Laterality Date  . NASAL SINUS SURGERY    . SINUS ENDO WITH FUSION Bilateral 04/13/2014   Procedure: SINUS ENDO WITH FUSION;  Surgeon: Ruby Cola, MD;  Location: [place];  Service: ENT;  Laterality: Bilateral;  . TONSILLECTOMY      Social History   Socioeconomic History  . Marital status: Married    Spouse name: Not on file  . Number of children: Not on file  . Years of education: Not on file  . Highest education level: Not on file  Occupational History  . Not on file  Social Needs  . Financial resource strain: Not on file  . Food insecurity:    Worry: Not on file    Inability: Not on file  . Transportation needs:    Medical: Not on file    Non-medical: Not on file  Tobacco Use  . Smoking status: Never Smoker  . Smokeless tobacco: Never Used  Substance and Sexual Activity  . Alcohol use: No  . Drug use: No  . Sexual activity: Not on file  Lifestyle  . Physical activity:    Days per week: Not on file    Minutes per session: Not on file  . Stress: Not on file  Relationships  . Social connections:    Talks on phone: Not on file    Gets together: Not on file    Attends religious service: Not on file    Active member of club or  organization: Not on file    Attends meetings of clubs or organizations: Not on file    Relationship status: Not on file  . Intimate partner violence:    Fear of current or ex partner: Not on file    Emotionally abused: Not on file    Physically abused: Not on file    Forced sexual activity: Not on file  Other Topics Concern  . Not on file  Social History Narrative  . Not on file    Current Outpatient Medications on File Prior to Visit  Medication Sig Dispense Refill  . amLODipine (NORVASC) 5 MG tablet Take 1 tablet (5 mg total) by mouth daily. 30 tablet 1  . blood glucose meter kit and supplies Dispense based on patient and insurance preference. Use up to four times daily as directed. (FOR ICD-9 250.00, 250.01). 1 each 0  . Insulin Glargine (BASAGLAR KWIKPEN) 100 UNIT/ML SOPN Inject 0.3 mLs (30 Units total) into the skin every morning. And pen needles 1/day 5 pen 11  . Insulin Pen Needle 32G X 4 MM MISC Use with insulin pens to dispense insulin as  directed 100 each 1  . rosuvastatin (CRESTOR) 20 MG tablet Take 20 mg by mouth daily.     No current facility-administered medications on file prior to visit.     No Known Allergies  Family History  Problem Relation Age of Onset  . Aneurysm Father 9  . Diabetes Sister     BP 138/90   Pulse 67   Ht _0  (1.727 m)   Wt 208 lb 6.4 oz (94.5 kg)   SpO2 98%   BMI 31.69 kg/m    Review of Systems He has regained weight.  He denies hypoglycemia    Objective:   Physical Exam VITAL SIGNS:  See vs page.   GENERAL: no distress.  Pulses: dorsalis pedis intact bilat.   MSK: no deformity of the feet.   CV: no leg edema.  Skin:  no ulcer on the feet.  normal color and temp on the feet.   Neuro: sensation is intact to touch on the feet.    Lab Results  Component Value Date   HGBA1C 7.5 (A) 10/06/2017   Lab Results  Component Value Date   CREATININE 1.21 04/26/2015   BUN 20 04/26/2015   NA 141 04/26/2015   K 3.3 (L)  04/26/2015   CL 109 04/26/2015   CO2 21 (L) 04/26/2015        Assessment & Plan:  Type 1 DM: with renal insuff: this is the best control this pt should aim for, given this regimen, which does match insulin to his changing needs throughout the day  Patient Instructions  check your blood sugar twice a day.  vary the time of day when you check, between before the 3 meals, and at bedtime.  also check if you have symptoms of your blood sugar being too high or too low.  please keep a record of the readings and bring it to your next appointment here (or you can bring the meter itself).  You can write it on any piece of paper.  please call us sooner if your blood sugar goes below 70, or if you have a lot of readings over 200. Please continue the same medications basaglar. Please come back for a follow-up appointment in 3-4 months.

## 2017-10-15 ENCOUNTER — Ambulatory Visit (HOSPITAL_COMMUNITY)
Admission: EM | Admit: 2017-10-15 | Discharge: 2017-10-15 | Disposition: A | Discharge status: Home / Self Care | Payer: Medicare Other | Attending: Family Medicine | Admitting: Family Medicine

## 2017-10-15 ENCOUNTER — Telehealth: Payer: Self-pay | Admitting: Emergency Medicine

## 2017-10-15 ENCOUNTER — Encounter (HOSPITAL_COMMUNITY): Payer: Self-pay

## 2017-10-15 DIAGNOSIS — L237 Allergic contact dermatitis due to plants, except food: Secondary | ICD-10-CM | POA: Diagnosis not present

## 2017-10-15 MED ORDER — TRIAMCINOLONE ACETONIDE 0.1 % EX CREA: 1.0000 "application " | TOPICAL_CREAM | Freq: Two times a day (BID) | CUTANEOUS | 0 refills | Status: DC

## 2017-10-15 MED ORDER — PREDNISONE 20 MG PO TABS: 40.0000 mg | ORAL_TABLET | Freq: Every day | ORAL | 0 refills | Status: AC

## 2017-10-15 NOTE — Telephone Encounter (Signed)
Pt called and had to go to urgent care because he got poison ivy close to his eye. They gave him prednisone. He wants to talk to you before taking it. Can you please give him a call back. Thanks.

## 2017-10-15 NOTE — ED Triage Notes (Signed)
Pt presents with poison ivy on left eye

## 2017-10-15 NOTE — Discharge Instructions (Signed)
Start prednisone as directed for poison ivy dermatitis. As discussed, you can use triamcinolone cream to the arm, but cannot apply to the face. While on prednisone, your blood glucose will increase, follow up with PCP for management if with extremely elevated blood glucose. As discussed, if there is rash around one eye with pain to the eye, blurry vision, painful eye movement, swelling, please see an ophthalmologist for further evaluation needed.

## 2017-10-15 NOTE — ED Triage Notes (Signed)
Pt also has poison ivy rash on both arms and right hand

## 2017-10-15 NOTE — ED Provider Notes (Signed)
[place]    CSN: [id number] Arrival date & time: 10/15/17  1519     History   Chief Complaint Chief Complaint  Patient presents with  . Rash    left eye    HPI Alec Jacobson is a 72 y.o. male.   72 year old male comes in for evaluation of 3-4 day history of rash possibly due to poison ivy. States that symptoms first started after coming in contact with poison ivy. With rash throughout bilateral extremities. Thinks he may have accidentally touched the left eye as the eyelid has been itching. Denies eyelid swelling, erythema, increased warmth. Denies red eye, vision changes, photophobia, eye pain. Denies fever, chills, night sweats. Cleaned area with alcohol without relief.      Past Medical History:  Diagnosis Date  . Cancer [facility])    prostate  . Chronic kidney disease    over 30 yrs ago, had one stone  . H/O allergic rhinitis    job related  . Hypertension     Patient Active Problem List   Diagnosis Date Noted  . Diabetes ([place]) 05/24/2015  . 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 Altered mental status   . Hyperkalemia   . Hypernatremia 04/24/2015  . Hypertension 04/22/2015  . Diabetes mellitus, new onset ([place]) 04/22/2015  . Hyperlipidemia 04/22/2015  . DKA (diabetic ketoacidoses) ([place]) 04/22/2015  . Thrush 04/22/2015    Past Surgical History:  Procedure Laterality Date  . NASAL SINUS SURGERY    . SINUS ENDO WITH FUSION Bilateral 04/13/2014   Procedure: SINUS ENDO WITH FUSION;  Surgeon: Ruby Cola, MD;  Location: [place];  Service: ENT;  Laterality: Bilateral;  . TONSILLECTOMY         Home Medications    Prior to Admission medications   Medication Sig Start Date End Date Taking? Authorizing Provider  amLODipine (NORVASC) 5 MG tablet Take 1 tablet (5 mg total) by mouth daily. 04/26/15   Orson Eva, MD  blood glucose meter kit and supplies Dispense based on patient and insurance preference. Use up to four times daily as directed.  (FOR ICD-9 250.00, 250.01). 04/26/15   Tat, Shanon Brow, MD  Insulin Glargine (BASAGLAR KWIKPEN) 100 UNIT/ML SOPN Inject 0.3 mLs (30 Units total) into the skin every morning. And pen needles 1/day 07/28/17   Renato Shin, MD  Insulin Pen Needle 32G X 4 MM MISC Use with insulin pens to dispense insulin as directed 04/26/15   Tat, Shanon Brow, MD  predniSONE (DELTASONE) 20 MG tablet Take 2 tablets (40 mg total) by mouth daily for 5 days. 10/15/17 10/20/17  Tasia Catchings, Javana Schey V, PA-C  rosuvastatin (CRESTOR) 20 MG tablet Take 20 mg by mouth daily.    [provider]  triamcinolone cream (KENALOG) 0.1 % Apply 1 application topically 2 (two) times daily. 10/15/17   Ok Edwards, PA-C    Family History Family History  Problem Relation Age of Onset  . Aneurysm Father 92  . Diabetes Sister     Social History Social History   Tobacco Use  . Smoking status: Never Smoker  . Smokeless tobacco: Never Used  Substance Use Topics  . Alcohol use: No  . Drug use: No     Allergies   Patient has no known allergies.   Review of Systems Review of Systems  Reason unable to perform ROS: See HPI as above.     Physical Exam Triage Vital Signs ED Triage Vitals  Enc Vitals Group  BP 10/15/17 1536 (!) 150/84     Pulse Rate 10/15/17 1536 81     Resp 10/15/17 1536 16     Temp 10/15/17 1536 98.1 F (36.7 C)     Temp Source 10/15/17 1536 Oral     SpO2 10/15/17 1536 97 %     Weight --      Height --      Head Circumference --      Peak Flow --      Pain Score 10/15/17 1540 8     Pain Loc --      Pain Edu? --      Excl. in [place]? --    No data found.  Updated Vital Signs BP (!) 150/84 (BP Location: Left Arm)   Pulse 81   Temp 98.1 F (36.7 C) (Oral)   Resp 16   SpO2 97%   Physical Exam  Constitutional: He is oriented to person, place, and time. He appears well-developed and well-nourished. No distress.  HENT:  Head: Normocephalic and atraumatic.  Eyes: Pupils are equal, round, and reactive to light.  Conjunctivae, EOM and lids are normal. Right conjunctiva is not injected. Left conjunctiva is not injected.  No rashes to the eyelid. No swelling to the eyelid. No tenderness to palpation.   Neurological: He is alert and oriented to person, place, and time.  Skin: He is not diaphoretic.  Vesicular rashes to bilateral forearms and fingers. One 0.5cm x 0.3cm blister to the right 5th dorsal finger. Surrounding erythema without increased warmth. No tenderness to palpation.     UC Treatments / Results  Labs (all labs ordered are listed, but only abnormal results are displayed) Labs Reviewed - No data to display  EKG None  Radiology No results found.  Procedures Incision and Drainage Date/Time: 10/15/2017 4:23 PM Performed by: Ok Edwards, PA-C Authorized by: Vanessa Kick, MD   Consent:    Consent obtained:  Verbal   Consent given by:  Patient   Risks discussed:  Incomplete drainage, infection and pain   Alternatives discussed:  No treatment Location:    Type:  Bulla   Size:  0.5cm x 0.3cm   Location:  Upper extremity   Upper extremity location:  Finger   Finger location:  R small finger Pre-procedure details:    Skin preparation:  Betadine Anesthesia (see MAR for exact dosages):    Anesthesia method:  None Procedure type:    Complexity:  Simple Procedure details:    Needle aspiration: yes     Needle size:  18 G   Drainage:  Serosanguinous   Drainage amount:  Scant   Packing materials:  None Post-procedure details:    Patient tolerance of procedure:  Tolerated well, no immediate complications   (including critical care time)  Medications Ordered in UC Medications - No data to display  Initial Impression / Assessment and Plan / UC Course  I have reviewed the triage vital signs and the nursing notes.  Pertinent labs & imaging results that were available during my care of the patient were reviewed by me and considered in my medical decision making (see chart for  details).    Bulla to the right 5th finger drained due to discomfort. Patient tolerated procedure well. Given some itching to the left eyelid, though without obvious rash, will treat with oral prednisone. Patient can apply triamcinolone to the extremities for further relief. Patient without rash, eye pain, conjunctivitis, painful eye movement, vision changes, low suspicion for shingles  to the left eye given poison ivy dermatitis to the upper extremities. Will have patient monitor symptoms closely. Return precautions given. Patient expresses understanding and agrees to plan.  Discussed with Dr Mannie Stabile, who agrees to plan.  Final Clinical Impressions(s) / UC Diagnoses   Final diagnoses:  Poison ivy dermatitis    ED Prescriptions    Medication Sig Dispense Auth. Provider   predniSONE (DELTASONE) 20 MG tablet Take 2 tablets (40 mg total) by mouth daily for 5 days. 10 tablet Romayne Ticas V, PA-C   triamcinolone cream (KENALOG) 0.1 % Apply 1 application topically 2 (two) times daily. 30 g Tobin Chad, [place] 10/15/17 1626

## 2017-10-16 NOTE — Telephone Encounter (Signed)
I called and spoke with patient who hasn't taken medication yet & he will start now that he has heard from [place]. He will call if CBG's are over 200.

## 2017-10-16 NOTE — Telephone Encounter (Signed)
Ok to take.  I'm glad you called.  Please take the steroid medication.  Check cbg.  Call if over 200.

## 2017-10-20 ENCOUNTER — Telehealth: Payer: Self-pay

## 2017-10-20 NOTE — Telephone Encounter (Signed)
I called & spoke with patient regarding [facility] message that BS was over 226. He called back & spoke wth on call nurse & provider advised he increase insulin 5 units until he stops taking prednisone. He stated that his blood sugars came down to around 100.

## 2017-12-01 ENCOUNTER — Other Ambulatory Visit: Payer: Self-pay | Admitting: Otolaryngology

## 2017-12-01 ENCOUNTER — Ambulatory Visit
Admission: RE | Admit: 2017-12-01 | Discharge: 2017-12-01 | Disposition: A | Discharge status: Home / Self Care | Payer: Medicare Other | Source: Ambulatory Visit | Attending: Otolaryngology | Admitting: Otolaryngology

## 2017-12-01 DIAGNOSIS — J439 Emphysema, unspecified: Secondary | ICD-10-CM

## 2017-12-01 DIAGNOSIS — R05 Cough: Secondary | ICD-10-CM

## 2017-12-01 DIAGNOSIS — J40 Bronchitis, not specified as acute or chronic: Secondary | ICD-10-CM

## 2017-12-01 DIAGNOSIS — R059 Cough, unspecified: Secondary | ICD-10-CM

## 2018-01-14 ENCOUNTER — Other Ambulatory Visit: Payer: Self-pay

## 2018-01-14 MED ORDER — BASAGLAR KWIKPEN 100 UNIT/ML ~~LOC~~ SOPN: 30.0000 [IU] | PEN_INJECTOR | SUBCUTANEOUS | 11 refills | Status: DC

## 2018-01-20 ENCOUNTER — Other Ambulatory Visit: Payer: Self-pay

## 2018-01-20 MED ORDER — BASAGLAR KWIKPEN 100 UNIT/ML ~~LOC~~ SOPN: 30.0000 [IU] | PEN_INJECTOR | SUBCUTANEOUS | 11 refills | Status: DC

## 2018-01-21 ENCOUNTER — Telehealth: Payer: Self-pay | Admitting: Endocrinology

## 2018-01-21 ENCOUNTER — Other Ambulatory Visit: Payer: Self-pay

## 2018-01-21 MED ORDER — BASAGLAR KWIKPEN 100 UNIT/ML ~~LOC~~ SOPN: 30.0000 [IU] | PEN_INJECTOR | SUBCUTANEOUS | 11 refills | Status: DC

## 2018-01-21 NOTE — Telephone Encounter (Signed)
Patients wife is stating [facility] faxed 10/25 information to refill his Basaglar. Please Advise. Ph # [phone]

## 2018-01-21 NOTE — Telephone Encounter (Signed)
This was taken care of successfully 01/20/18. Called pt and informed him to follow up with [facility] re: status of refill request. Verbalized acceptance and understanding.

## 2018-01-23 ENCOUNTER — Other Ambulatory Visit: Payer: Self-pay

## 2018-01-23 MED ORDER — INSULIN GLARGINE 100 UNIT/ML SOLOSTAR PEN: 30.0000 [IU] | PEN_INJECTOR | SUBCUTANEOUS | 99 refills | Status: DC

## 2018-01-23 NOTE — Telephone Encounter (Signed)
Received denial from pt insurance for [employer]. Recommended Lantus Solostar. Rx written by Dr. Loanne Drilling and sent to [facility]. Pt made aware of this change. Verbalized acceptance and understanding.

## 2018-02-12 ENCOUNTER — Encounter: Payer: Self-pay | Admitting: Endocrinology

## 2018-02-12 ENCOUNTER — Ambulatory Visit: Payer: Medicare Other | Admitting: Endocrinology

## 2018-02-12 VITALS — BP 160/80 | HR 59 | Ht 68.0 in | Wt 219.8 lb

## 2018-02-12 DIAGNOSIS — E109 Type 1 diabetes mellitus without complications: Secondary | ICD-10-CM | POA: Diagnosis not present

## 2018-02-12 LAB — POCT GLYCOSYLATED HEMOGLOBIN (HGB A1C): Hemoglobin A1C: 7.6 % — AB (ref 4.0–5.6)

## 2018-02-12 NOTE — Patient Instructions (Addendum)
check your blood sugar twice a day.  vary the time of day when you check, between before the 3 meals, and at bedtime.  also check if you have symptoms of your blood sugar being too high or too low.  please keep a record of the readings and bring it to your next appointment here (or you can bring the meter itself).  You can write it on any piece of paper.  please call us sooner if your blood sugar goes below 70, or if you have a lot of readings over 200. Please continue the same basaglar. Please come back for a follow-up appointment in 3-4 months.

## 2018-02-12 NOTE — Progress Notes (Signed)
Subjective:    Patient ID: Alec Jacobson, male    DOB: 01/07/1946, 72 y.o.   MRN: [id number]  HPI Pt returns for f/u of diabetes mellitus:  DM type: 1  Dx'ed: 2017.  Complications: renal insuff.  Therapy: insulin since dx.  DKA: mild, in 2017.   Severe hypoglycemia: never.   Pancreatitis: never.   Other: he takes multiple daily injections; he declines pump rx.  Interval history: no cbg record, but states cbg's are well-controlled. Pt says he never misses the basaglar.   Past Medical History:  Diagnosis Date  . Cancer [facility])    prostate  . Chronic kidney disease    over 30 yrs ago, had one stone  . H/O allergic rhinitis    job related  . Hypertension     Past Surgical History:  Procedure Laterality Date  . NASAL SINUS SURGERY    . SINUS ENDO WITH FUSION Bilateral 04/13/2014   Procedure: SINUS ENDO WITH FUSION;  Surgeon: Ruby Cola, MD;  Location: [place];  Service: ENT;  Laterality: Bilateral;  . TONSILLECTOMY      Social History   Socioeconomic History  . Marital status: Married    Spouse name: Not on file  . Number of children: Not on file  . Years of education: Not on file  . Highest education level: Not on file  Occupational History  . Not on file  Social Needs  . Financial resource strain: Not on file  . Food insecurity:    Worry: Not on file    Inability: Not on file  . Transportation needs:    Medical: Not on file    Non-medical: Not on file  Tobacco Use  . Smoking status: Never Smoker  . Smokeless tobacco: Never Used  Substance and Sexual Activity  . Alcohol use: No  . Drug use: No  . Sexual activity: Not on file  Lifestyle  . Physical activity:    Days per week: Not on file    Minutes per session: Not on file  . Stress: Not on file  Relationships  . Social connections:    Talks on phone: Not on file    Gets together: Not on file    Attends religious service: Not on file    Active member of club or organization: Not on file    Attends  meetings of clubs or organizations: Not on file    Relationship status: Not on file  . Intimate partner violence:    Fear of current or ex partner: Not on file    Emotionally abused: Not on file    Physically abused: Not on file    Forced sexual activity: Not on file  Other Topics Concern  . Not on file  Social History Narrative  . Not on file    Current Outpatient Medications on File Prior to Visit  Medication Sig Dispense Refill  . amLODipine (NORVASC) 5 MG tablet Take 1 tablet (5 mg total) by mouth daily. 30 tablet 1  . blood glucose meter kit and supplies Dispense based on patient and insurance preference. Use up to four times daily as directed. (FOR ICD-9 250.00, 250.01). 1 each 0  . Insulin Glargine (LANTUS SOLOSTAR) 100 UNIT/ML Solostar Pen Inject 30 Units into the skin every morning. 5 pen PRN  . Insulin Pen Needle 32G X 4 MM MISC Use with insulin pens to dispense insulin as directed 100 each 1  . rosuvastatin (CRESTOR) 20 MG tablet Take 20 mg by  mouth daily.     No current facility-administered medications on file prior to visit.     No Known Allergies  Family History  Problem Relation Age of Onset  . Aneurysm Father 41  . Diabetes Sister     BP (!) 160/80 (BP Location: Right Arm, Patient Position: Sitting, Cuff Size: Large)   Pulse (!) 59   Ht _0  (1.727 m)   Wt 219 lb 12.8 oz (99.7 kg)   SpO2 94%   BMI 33.42 kg/m    Review of Systems He denies hypoglycemia.      Objective:   Physical Exam VITAL SIGNS:  See vs page GENERAL: no distress Pulses: dorsalis pedis intact bilat.   MSK: no deformity of the feet CV: trace bilat leg edema Skin:  no ulcer on the feet.  normal color and temp on the feet. Neuro: sensation is intact to touch on the feet  Lab Results  Component Value Date   HGBA1C 7.6 (A) 02/12/2018   Lab Results  Component Value Date   CREATININE 1.21 04/26/2015   BUN 20 04/26/2015   NA 141 04/26/2015   K 3.3 (L) 04/26/2015   CL 109  04/26/2015   CO2 21 (L) 04/26/2015        Assessment & Plan:  Type 1 DM, with renal insuff: this is the best control this pt should aim for, given this regimen, which does match insulin to his changing needs throughout the day HTN: recheck next time  Patient Instructions  check your blood sugar twice a day.  vary the time of day when you check, between before the 3 meals, and at bedtime.  also check if you have symptoms of your blood sugar being too high or too low.  please keep a record of the readings and bring it to your next appointment here (or you can bring the meter itself).  You can write it on any piece of paper.  please call us sooner if your blood sugar goes below 70, or if you have a lot of readings over 200. Please continue the same basaglar. Please come back for a follow-up appointment in 3-4 months.

## 2018-06-15 ENCOUNTER — Ambulatory Visit: Payer: Medicare Other | Admitting: Endocrinology

## 2018-06-15 ENCOUNTER — Encounter: Payer: Self-pay | Admitting: Endocrinology

## 2018-06-15 ENCOUNTER — Other Ambulatory Visit: Payer: Self-pay

## 2018-06-15 VITALS — BP 162/92 | HR 89 | Ht 68.0 in | Wt 214.0 lb

## 2018-06-15 DIAGNOSIS — E109 Type 1 diabetes mellitus without complications: Secondary | ICD-10-CM

## 2018-06-15 LAB — POCT GLYCOSYLATED HEMOGLOBIN (HGB A1C): HEMOGLOBIN A1C: 12.1 % — AB (ref 4.0–5.6)

## 2018-06-15 NOTE — Progress Notes (Signed)
Subjective:    Patient ID: Alec Jacobson, male    DOB: 10/14/1945, 73 y.o.   MRN: [id number]  HPI Pt returns for f/u of diabetes mellitus:  DM type: 1  Dx'ed: 2017.  Complications: renal insuff.  Therapy: insulin since dx.  DKA: mild, in 2017.   Severe hypoglycemia: never.   Pancreatitis: never.   Other: he takes QD insulin, after poor results with multiple daily injections; he declines pump rx.  Interval history: no cbg record, but states cbg's are well-controlled. Pt says he never misses the Lantus.  He says a pen lasts approx 5 days, and a box of pens lasts approx 1 month.  He denies any probs with the pen.  He says some of the insulin leaks out at the injection site, after an injection.   Past Medical History:  Diagnosis Date  . Cancer [facility])    prostate  . Chronic kidney disease    over 30 yrs ago, had one stone  . H/O allergic rhinitis    job related  . Hypertension     Past Surgical History:  Procedure Laterality Date  . NASAL SINUS SURGERY    . SINUS ENDO WITH FUSION Bilateral 04/13/2014   Procedure: SINUS ENDO WITH FUSION;  Surgeon: Ruby Cola, MD;  Location: [place];  Service: ENT;  Laterality: Bilateral;  . TONSILLECTOMY      Social History   Socioeconomic History  . Marital status: Married    Spouse name: Not on file  . Number of children: Not on file  . Years of education: Not on file  . Highest education level: Not on file  Occupational History  . Not on file  Social Needs  . Financial resource strain: Not on file  . Food insecurity:    Worry: Not on file    Inability: Not on file  . Transportation needs:    Medical: Not on file    Non-medical: Not on file  Tobacco Use  . Smoking status: Never Smoker  . Smokeless tobacco: Never Used  Substance and Sexual Activity  . Alcohol use: No  . Drug use: No  . Sexual activity: Not on file  Lifestyle  . Physical activity:    Days per week: Not on file    Minutes per session: Not on file  . Stress:  Not on file  Relationships  . Social connections:    Talks on phone: Not on file    Gets together: Not on file    Attends religious service: Not on file    Active member of club or organization: Not on file    Attends meetings of clubs or organizations: Not on file    Relationship status: Not on file  . Intimate partner violence:    Fear of current or ex partner: Not on file    Emotionally abused: Not on file    Physically abused: Not on file    Forced sexual activity: Not on file  Other Topics Concern  . Not on file  Social History Narrative  . Not on file    Current Outpatient Medications on File Prior to Visit  Medication Sig Dispense Refill  . amLODipine (NORVASC) 5 MG tablet Take 1 tablet (5 mg total) by mouth daily. 30 tablet 1  . blood glucose meter kit and supplies Dispense based on patient and insurance preference. Use up to four times daily as directed. (FOR ICD-9 250.00, 250.01). 1 each 0  . Insulin Glargine (LANTUS SOLOSTAR)  100 UNIT/ML Solostar Pen Inject 30 Units into the skin every morning. 5 pen PRN  . Insulin Pen Needle 32G X 4 MM MISC Use with insulin pens to dispense insulin as directed 100 each 1  . rosuvastatin (CRESTOR) 20 MG tablet Take 20 mg by mouth daily.     No current facility-administered medications on file prior to visit.     No Known Allergies  Family History  Problem Relation Age of Onset  . Aneurysm Father 27  . Diabetes Sister     BP (!) 162/92 (BP Location: Right Arm, Patient Position: Sitting, Cuff Size: Normal)   Pulse 89   Ht '5\' 8"'$  (1.727 m)   Wt 214 lb (97.1 kg)   SpO2 96%   BMI 32.54 kg/m    Review of Systems He denies hypoglycemia.     Objective:   Physical Exam VITAL SIGNS:  See vs page GENERAL: no distress Pulses: dorsalis pedis intact bilat.   MSK: no deformity of the feet CV: no leg edema Skin:  no ulcer on the feet.  normal color and temp on the feet. Neuro: sensation is intact to touch on the feet.    Lab  Results  Component Value Date   CREATININE 1.21 04/26/2015   BUN 20 04/26/2015   NA 141 04/26/2015   K 3.3 (L) 04/26/2015   CL 109 04/26/2015   CO2 21 (L) 04/26/2015   A1c=12.1%     Assessment & Plan:  Type 1 DM, with renal insuff: much worse Insulin leakage: we discussed HTN: is noted today  Patient Instructions  Your blood pressure is high today.  Please see your primary care provider soon, to have it rechecked check your blood sugar twice a day.  vary the time of day when you check, between before the 3 meals, and at bedtime.  also check if you have symptoms of your blood sugar being too high or too low.  please keep a record of the readings and bring it to your next appointment here (or you can bring the meter itself).  You can write it on any piece of paper.  please call us sooner if your blood sugar goes below 70, or if you have a lot of readings over 200. Please continue the same Lantus.   To avoid the insulin leaking out, inject it deeper, and withdraw the needle slowly. Please come back for a follow-up appointment in 2 months.

## 2018-06-15 NOTE — Patient Instructions (Addendum)
Your blood pressure is high today.  Please see your primary care provider soon, to have it rechecked check your blood sugar twice a day.  vary the time of day when you check, between before the 3 meals, and at bedtime.  also check if you have symptoms of your blood sugar being too high or too low.  please keep a record of the readings and bring it to your next appointment here (or you can bring the meter itself).  You can write it on any piece of paper.  please call us sooner if your blood sugar goes below 70, or if you have a lot of readings over 200. Please continue the same Lantus.   To avoid the insulin leaking out, inject it deeper, and withdraw the needle slowly. Please come back for a follow-up appointment in 2 months.

## 2018-08-20 ENCOUNTER — Ambulatory Visit: Payer: Medicare Other | Admitting: Endocrinology

## 2018-08-20 ENCOUNTER — Other Ambulatory Visit: Payer: Self-pay

## 2018-08-20 ENCOUNTER — Encounter: Payer: Self-pay | Admitting: Endocrinology

## 2018-08-20 VITALS — BP 140/92 | HR 63 | Temp 98.3°F | Wt 213.0 lb

## 2018-08-20 DIAGNOSIS — E1029 Type 1 diabetes mellitus with other diabetic kidney complication: Secondary | ICD-10-CM | POA: Diagnosis not present

## 2018-08-20 DIAGNOSIS — E109 Type 1 diabetes mellitus without complications: Secondary | ICD-10-CM

## 2018-08-20 LAB — POCT GLYCOSYLATED HEMOGLOBIN (HGB A1C): Hemoglobin A1C: 8 % — AB (ref 4.0–5.6)

## 2018-08-20 NOTE — Patient Instructions (Addendum)
check your blood sugar twice a day.  vary the time of day when you check, between before the 3 meals, and at bedtime.  also check if you have symptoms of your blood sugar being too high or too low.  please keep a record of the readings and bring it to your next appointment here (or you can bring the meter itself).  You can write it on any piece of paper.  please call us sooner if your blood sugar goes below 70, or if you have a lot of readings over 200. Please continue the same Lantus.   Please see CDE about diet and injection problems. Please come back for a follow-up appointment in 2 months.

## 2018-08-20 NOTE — Progress Notes (Signed)
Subjective:    Patient ID: Alec Jacobson, male    DOB: Apr 07, 1945, 73 y.o.   MRN: [id number]  HPI Pt returns for f/u of diabetes mellitus:  DM type: 1  Dx'ed: 2017.  Complications: renal insuff.  Therapy: insulin since dx.  DKA: mild, in 2017.   Severe hypoglycemia: never.   Pancreatitis: never.   Other: he takes QD insulin, after poor results with multiple daily injections; he declines pump rx.   Interval history: no cbg record, but states cbg's are in the low to mid-100's. Pt says he never misses the Lantus. He says some of the insulin continues to leak out at the injection site, after an injection.  Also, the needle sometimes bends during the injection.   Past Medical History:  Diagnosis Date  . Cancer [facility])    prostate  . Chronic kidney disease    over 30 yrs ago, had one stone  . H/O allergic rhinitis    job related  . Hypertension     Past Surgical History:  Procedure Laterality Date  . NASAL SINUS SURGERY    . SINUS ENDO WITH FUSION Bilateral 04/13/2014   Procedure: SINUS ENDO WITH FUSION;  Surgeon: Ruby Cola, MD;  Location: [place];  Service: ENT;  Laterality: Bilateral;  . TONSILLECTOMY      Social History   Socioeconomic History  . Marital status: Married    Spouse name: Not on file  . Number of children: Not on file  . Years of education: Not on file  . Highest education level: Not on file  Occupational History  . Not on file  Social Needs  . Financial resource strain: Not on file  . Food insecurity:    Worry: Not on file    Inability: Not on file  . Transportation needs:    Medical: Not on file    Non-medical: Not on file  Tobacco Use  . Smoking status: Never Smoker  . Smokeless tobacco: Never Used  Substance and Sexual Activity  . Alcohol use: No  . Drug use: No  . Sexual activity: Not on file  Lifestyle  . Physical activity:    Days per week: Not on file    Minutes per session: Not on file  . Stress: Not on file  Relationships  .  Social connections:    Talks on phone: Not on file    Gets together: Not on file    Attends religious service: Not on file    Active member of club or organization: Not on file    Attends meetings of clubs or organizations: Not on file    Relationship status: Not on file  . Intimate partner violence:    Fear of current or ex partner: Not on file    Emotionally abused: Not on file    Physically abused: Not on file    Forced sexual activity: Not on file  Other Topics Concern  . Not on file  Social History Narrative  . Not on file    Current Outpatient Medications on File Prior to Visit  Medication Sig Dispense Refill  . amLODipine (NORVASC) 5 MG tablet Take 1 tablet (5 mg total) by mouth daily. 30 tablet 1  . blood glucose meter kit and supplies Dispense based on patient and insurance preference. Use up to four times daily as directed. (FOR ICD-9 250.00, 250.01). 1 each 0  . Insulin Glargine (LANTUS SOLOSTAR) 100 UNIT/ML Solostar Pen Inject 30 Units into the skin every  morning. 5 pen PRN  . Insulin Pen Needle 32G X 4 MM MISC Use with insulin pens to dispense insulin as directed 100 each 1  . rosuvastatin (CRESTOR) 20 MG tablet Take 20 mg by mouth daily.     No current facility-administered medications on file prior to visit.     No Known Allergies  Family History  Problem Relation Age of Onset  . Aneurysm Father 74  . Diabetes Sister     BP (!) 140/92 (BP Location: Right Arm, Patient Position: Sitting, Cuff Size: Normal)   Pulse 63   Temp 98.3 F (36.8 C) (Oral)   Wt 213 lb (96.6 kg)   SpO2 98%   BMI 32.39 kg/m    Review of Systems He denies hypoglycemia.     Objective:   Physical Exam VITAL SIGNS:  See vs page GENERAL: no distress Pulses: dorsalis pedis intact bilat.   MSK: no deformity of the feet CV: no leg edema Skin:  no ulcer on the feet.  normal color and temp on the feet. Neuro: sensation is intact to touch on the feet.     Lab Results  Component  Value Date   HGBA1C 8.0 (A) 08/20/2018       Assessment & Plan:  Type 1 DM, with renal insuff: he needs increased rx Medical device malfunction: worse HTN: recheck next time  Patient Instructions  check your blood sugar twice a day.  vary the time of day when you check, between before the 3 meals, and at bedtime.  also check if you have symptoms of your blood sugar being too high or too low.  please keep a record of the readings and bring it to your next appointment here (or you can bring the meter itself).  You can write it on any piece of paper.  please call us sooner if your blood sugar goes below 70, or if you have a lot of readings over 200. Please continue the same Lantus.   Please see CDE about diet and injection problems. Please come back for a follow-up appointment in 2 months.

## 2018-10-07 ENCOUNTER — Other Ambulatory Visit: Payer: Self-pay

## 2018-10-07 DIAGNOSIS — Z20822 Contact with and (suspected) exposure to covid-19: Secondary | ICD-10-CM

## 2018-10-11 LAB — NOVEL CORONAVIRUS, NAA: SARS-CoV-2, NAA: NOT DETECTED

## 2018-10-20 ENCOUNTER — Encounter: Payer: Self-pay | Admitting: Endocrinology

## 2018-10-20 ENCOUNTER — Other Ambulatory Visit: Payer: Self-pay

## 2018-10-20 ENCOUNTER — Ambulatory Visit: Payer: Medicare Other | Admitting: Endocrinology

## 2018-10-20 VITALS — BP 136/72 | HR 85 | Ht 68.0 in | Wt 216.2 lb

## 2018-10-20 DIAGNOSIS — E109 Type 1 diabetes mellitus without complications: Secondary | ICD-10-CM | POA: Diagnosis not present

## 2018-10-20 LAB — POCT GLYCOSYLATED HEMOGLOBIN (HGB A1C): Hemoglobin A1C: 7.2 % — AB (ref 4.0–5.6)

## 2018-10-20 NOTE — Patient Instructions (Addendum)
check your blood sugar twice a day.  vary the time of day when you check, between before the 3 meals, and at bedtime.  also check if you have symptoms of your blood sugar being too high or too low.  please keep a record of the readings and bring it to your next appointment here (or you can bring the meter itself).  You can write it on any piece of paper.  please call us sooner if your blood sugar goes below 70, or if you have a lot of readings over 200. Please continue the same Lantus.   Please see CDE about diet and injection problems, as scheduled.   Please come back for a follow-up appointment in 3 months.

## 2018-10-20 NOTE — Progress Notes (Signed)
 Subjective:    Patient ID: Alec Jacobson, male    DOB: 07/05/1945, 72 y.o.   MRN: [id number]  HPI Pt returns for f/u of diabetes mellitus:  DM type: 1  Dx'ed: 2017.  Complications: renal insuff.  Therapy: insulin since dx.  DKA: mild, in 2017.   Severe hypoglycemia: never.   Pancreatitis: never.   Other: he takes QD insulin, after poor results with multiple daily injections; he declines pump rx.   Interval history: no cbg record, but states cbg's are in the 100's. Pt says he never misses the Lantus.  pt states he feels well in general.    Past Medical History:  Diagnosis Date  . Cancer ([facility])    prostate  . Chronic kidney disease    over 30 yrs ago, had one stone  . H/O allergic rhinitis    job related  . Hypertension     Past Surgical History:  Procedure Laterality Date  . NASAL SINUS SURGERY    . SINUS ENDO WITH FUSION Bilateral 04/13/2014   Procedure: SINUS ENDO WITH FUSION;  Surgeon: Mitchell Gore, MD;  Location: [facility];  Service: ENT;  Laterality: Bilateral;  . TONSILLECTOMY      Social History   Socioeconomic History  . Marital status: Married    Spouse name: Not on file  . Number of children: Not on file  . Years of education: Not on file  . Highest education level: Not on file  Occupational History  . Not on file  Social Needs  . Financial resource strain: Not on file  . Food insecurity    Worry: Not on file    Inability: Not on file  . Transportation needs    Medical: Not on file    Non-medical: Not on file  Tobacco Use  . Smoking status: Never Smoker  . Smokeless tobacco: Never Used  Substance and Sexual Activity  . Alcohol use: No  . Drug use: No  . Sexual activity: Not on file  Lifestyle  . Physical activity    Days per week: Not on file    Minutes per session: Not on file  . Stress: Not on file  Relationships  . Social connections    Talks on phone: Not on file    Gets together: Not on file    Attends religious service: Not on file   Active member of club or organization: Not on file    Attends meetings of clubs or organizations: Not on file    Relationship status: Not on file  . Intimate partner violence    Fear of current or ex partner: Not on file    Emotionally abused: Not on file    Physically abused: Not on file    Forced sexual activity: Not on file  Other Topics Concern  . Not on file  Social History Narrative  . Not on file    Current Outpatient Medications on File Prior to Visit  Medication Sig Dispense Refill  . amLODipine (NORVASC) 5 MG tablet Take 1 tablet (5 mg total) by mouth daily. 30 tablet 1  . blood glucose meter kit and supplies Dispense based on patient and insurance preference. Use up to four times daily as directed. (FOR ICD-9 250.00, 250.01). 1 each 0  . Insulin Glargine (LANTUS SOLOSTAR) 100 UNIT/ML Solostar Pen Inject 30 Units into the skin every morning. 5 pen PRN  . Insulin Pen Needle 32G X 4 MM MISC Use with insulin pens to dispense insulin   as directed 100 each 1  . rosuvastatin (CRESTOR) 20 MG tablet Take 20 mg by mouth daily.     No current facility-administered medications on file prior to visit.     No Known Allergies  Family History  Problem Relation Age of Onset  . Aneurysm Father 37  . Diabetes Sister     BP 136/72 (BP Location: Left Arm, Patient Position: Sitting, Cuff Size: Large)   Pulse 85   Ht 5' 8" (1.727 m)   Wt 216 lb 3.2 oz (98.1 kg)   SpO2 92%   BMI 32.87 kg/m    Review of Systems He denies hypoglycemia.     Objective:   Physical Exam VITAL SIGNS:  See vs page GENERAL: no distress Pulses: dorsalis pedis intact bilat.   MSK: no deformity of the feet CV: no leg edema Skin:  no ulcer on the feet.  normal color and temp on the feet. Neuro: sensation is intact to touch on the feet.     Lab Results  Component Value Date   HGBA1C 8.0 (A) 08/20/2018       Assessment & Plan:  Type 1 DM, with renal insuff: he needs increased rx.  Insulin inject  probs: in view of this, we won't increase insulin now--we'll ask CDE to address inject probs.    Patient Instructions  check your blood sugar twice a day.  vary the time of day when you check, between before the 3 meals, and at bedtime.  also check if you have symptoms of your blood sugar being too high or too low.  please keep a record of the readings and bring it to your next appointment here (or you can bring the meter itself).  You can write it on any piece of paper.  please call us sooner if your blood sugar goes below 70, or if you have a lot of readings over 200. Please continue the same Lantus.   Please see CDE about diet and injection problems, as scheduled.   Please come back for a follow-up appointment in 3 months.

## 2018-11-12 ENCOUNTER — Ambulatory Visit: Payer: Medicare Other | Admitting: Dietician

## 2019-01-28 ENCOUNTER — Other Ambulatory Visit: Payer: Self-pay | Admitting: Endocrinology

## 2019-02-17 ENCOUNTER — Other Ambulatory Visit: Payer: Self-pay

## 2019-02-22 ENCOUNTER — Ambulatory Visit (INDEPENDENT_AMBULATORY_CARE_PROVIDER_SITE_OTHER): Payer: Medicare Other | Admitting: Endocrinology

## 2019-02-22 ENCOUNTER — Other Ambulatory Visit: Payer: Self-pay

## 2019-02-22 VITALS — BP 142/88 | HR 74 | Temp 97.9°F | Ht 68.0 in | Wt 216.8 lb

## 2019-02-22 DIAGNOSIS — E1129 Type 2 diabetes mellitus with other diabetic kidney complication: Secondary | ICD-10-CM | POA: Diagnosis not present

## 2019-02-22 DIAGNOSIS — I10 Essential (primary) hypertension: Secondary | ICD-10-CM

## 2019-02-22 DIAGNOSIS — E109 Type 1 diabetes mellitus without complications: Secondary | ICD-10-CM | POA: Diagnosis not present

## 2019-02-22 LAB — BASIC METABOLIC PANEL
BUN: 23 mg/dL (ref 6–23)
CO2: 29 mEq/L (ref 19–32)
Calcium: 9.4 mg/dL (ref 8.4–10.5)
Chloride: 101 mEq/L (ref 96–112)
Creatinine, Ser: 0.98 mg/dL (ref 0.40–1.50)
GFR: 90.99 mL/min (ref >60.00)
Glucose, Bld: 224 mg/dL — ABNORMAL HIGH (ref 70–99)
Potassium: 4.2 mEq/L (ref 3.5–5.1)
Sodium: 137 mEq/L (ref 135–145)

## 2019-02-22 LAB — POCT GLYCOSYLATED HEMOGLOBIN (HGB A1C): Hemoglobin A1C: 8.5 % — AB (ref 4.0–5.6)

## 2019-02-22 LAB — TSH: TSH: 3.2 u[IU]/mL (ref 0.35–4.50)

## 2019-02-22 MED ORDER — LANTUS SOLOSTAR 100 UNIT/ML ~~LOC~~ SOPN
35.0000 [IU] | PEN_INJECTOR | SUBCUTANEOUS | 3 refills | Status: DC
Start: 2019-02-22 — End: 2019-02-24

## 2019-02-22 NOTE — Patient Instructions (Addendum)
Your blood pressure is high today.  Please see your primary care provider soon, to have it rechecked I have sent a prescription to your pharmacy, to increase the Lantus to 35 units each morning. Blood tests are requested for you today.  We'll let you know about the results.  check your blood sugar twice a day.  vary the time of day when you check, between before the 3 meals, and at bedtime.  also check if you have symptoms of your blood sugar being too high or too low.  please keep a record of the readings and bring it to your next appointment here (or you can bring the meter itself).  You can write it on any piece of paper.  please call us sooner if your blood sugar goes below 70, or if you have a lot of readings over 200. Please come back for a follow-up appointment in 2 months.

## 2019-02-22 NOTE — Progress Notes (Signed)
Subjective:    Patient ID: Alec Jacobson, male    DOB: [birth year], 73 y.o.   MRN: [id number]  HPI Pt returns for f/u of diabetes mellitus:  DM type: 1  Dx'ed: 2017.  Complications: renal insuff.  Therapy: insulin since dx.  DKA: mild, in 2017.   Severe hypoglycemia: never.   Pancreatitis: never.   Other: he takes QD insulin, after poor results with multiple daily injections; he declines pump rx.   Interval history: no cbg record, but states cbg's vary from 101-127.  Pt says he never misses the Lantus.  pt states he feels well in general.  No recent steroids.   Past Medical History:  Diagnosis Date  . Cancer [facility])    prostate  . Chronic kidney disease    over 30 yrs ago, had one stone  . H/O allergic rhinitis    job related  . Hypertension     Past Surgical History:  Procedure Laterality Date  . NASAL SINUS SURGERY    . SINUS ENDO WITH FUSION Bilateral 04/13/2014   Procedure: SINUS ENDO WITH FUSION;  Surgeon: Ruby Cola, MD;  Location: [place];  Service: ENT;  Laterality: Bilateral;  . TONSILLECTOMY      Social History   Socioeconomic History  . Marital status: Married    Spouse name: Not on file  . Number of children: Not on file  . Years of education: Not on file  . Highest education level: Not on file  Occupational History  . Not on file  Social Needs  . Financial resource strain: Not on file  . Food insecurity    Worry: Not on file    Inability: Not on file  . Transportation needs    Medical: Not on file    Non-medical: Not on file  Tobacco Use  . Smoking status: Never Smoker  . Smokeless tobacco: Never Used  Substance and Sexual Activity  . Alcohol use: No  . Drug use: No  . Sexual activity: Not on file  Lifestyle  . Physical activity    Days per week: Not on file    Minutes per session: Not on file  . Stress: Not on file  Relationships  . Social Herbalist on phone: Not on file    Gets together: Not on file    Attends religious  service: Not on file    Active member of club or organization: Not on file    Attends meetings of clubs or organizations: Not on file    Relationship status: Not on file  . Intimate partner violence    Fear of current or ex partner: Not on file    Emotionally abused: Not on file    Physically abused: Not on file    Forced sexual activity: Not on file  Other Topics Concern  . Not on file  Social History Narrative  . Not on file    Current Outpatient Medications on File Prior to Visit  Medication Sig Dispense Refill  . amLODipine (NORVASC) 5 MG tablet Take 1 tablet (5 mg total) by mouth daily. 30 tablet 1  . blood glucose meter kit and supplies Dispense based on patient and insurance preference. Use up to four times daily as directed. (FOR ICD-9 250.00, 250.01). 1 each 0  . Insulin Pen Needle 32G X 4 MM MISC Use with insulin pens to dispense insulin as directed 100 each 1  . rosuvastatin (CRESTOR) 20 MG tablet Take 20 mg by  mouth daily.     No current facility-administered medications on file prior to visit.     No Known Allergies  Family History  Problem Relation Age of Onset  . Aneurysm Father 75  . Diabetes Sister     BP (!) 142/88 (BP Location: Right Arm, Patient Position: Sitting, Cuff Size: Normal)   Pulse 74   Temp 97.9 F (36.6 C)   Ht '5\' 8"'$  (1.727 m)   Wt 216 lb 12.8 oz (98.3 kg)   SpO2 99%   BMI 32.96 kg/m    Review of Systems He denies hypoglycemia    Objective:   Physical Exam VITAL SIGNS:  See vs page GENERAL: no distress Pulses: dorsalis pedis intact bilat.   MSK: no deformity of the feet CV: trace bilat leg edema Skin:  no ulcer on the feet.  normal color and temp on the feet.   Neuro: sensation is intact to touch on the feet.    A1c=8.5%     Assessment & Plan:  Type 1 DM: he needs increased rx.  Renal insuff: recheck today.   HTN: is noted today.   Patient Instructions  Your blood pressure is high today.  Please see your primary care  provider soon, to have it rechecked I have sent a prescription to your pharmacy, to increase the Lantus to 35 units each morning. Blood tests are requested for you today.  We'll let you know about the results.  check your blood sugar twice a day.  vary the time of day when you check, between before the 3 meals, and at bedtime.  also check if you have symptoms of your blood sugar being too high or too low.  please keep a record of the readings and bring it to your next appointment here (or you can bring the meter itself).  You can write it on any piece of paper.  please call us sooner if your blood sugar goes below 70, or if you have a lot of readings over 200. Please come back for a follow-up appointment in 2 months.

## 2019-02-24 ENCOUNTER — Other Ambulatory Visit: Payer: Self-pay

## 2019-02-24 ENCOUNTER — Telehealth: Payer: Self-pay

## 2019-02-24 DIAGNOSIS — E109 Type 1 diabetes mellitus without complications: Secondary | ICD-10-CM

## 2019-02-24 MED ORDER — INSULIN PEN NEEDLE 32G X 4 MM MISC: 1.0000 | Freq: Every day | 1 refills | Status: DC

## 2019-02-24 MED ORDER — LANTUS SOLOSTAR 100 UNIT/ML ~~LOC~~ SOPN: 35.0000 [IU] | PEN_INJECTOR | SUBCUTANEOUS | 3 refills | Status: DC

## 2019-02-24 NOTE — Telephone Encounter (Signed)
E-Prescribing Status: Receipt confirmed by pharmacy (02/24/2019 11:05 AM EST)

## 2019-02-24 NOTE — Telephone Encounter (Signed)
MEDICATION: Insulin Pen Needle 32G X 4 MM MISC Insulin Glargine (LANTUS SOLOSTAR) 100 UNIT/ML Solostar Pen  PHARMACY:  [place], [place] - [place] : yes   IS PATIENT OUT OF MEDICATION:   IF NOT; HOW MUCH IS LEFT:   LAST APPOINTMENT DATE: @12 /04/2018  NEXT APPOINTMENT DATE:@2 /03/2019  DO WE HAVE YOUR PERMISSION TO LEAVE A DETAILED MESSAGE:  OTHER COMMENTS: when patient was in office last Rx was sent to wrong pharmacy    **Let patient know to contact pharmacy at the end of the day to make sure medication is ready. **  ** Please notify patient to allow 48-72 hours to process**  **Encourage patient to contact the pharmacy for refills or they can request refills through [facility]**

## 2019-02-24 NOTE — Telephone Encounter (Signed)
-----   Message from Renato Shin, MD sent at 02/22/2019  6:15 PM EST ----- please contact patient: Kidneys are back to normal--good.  I'll see you next time.

## 2019-02-24 NOTE — Telephone Encounter (Signed)
Lab results reviewed by Dr. Ellison. Letter has been mailed. For future reference, letter can be found in Epic. 

## 2019-04-22 ENCOUNTER — Other Ambulatory Visit: Payer: Self-pay

## 2019-04-26 ENCOUNTER — Other Ambulatory Visit: Payer: Self-pay

## 2019-04-26 ENCOUNTER — Ambulatory Visit: Payer: Medicare Other | Admitting: Endocrinology

## 2019-04-26 ENCOUNTER — Encounter: Payer: Self-pay | Admitting: Endocrinology

## 2019-04-26 VITALS — BP 160/98 | HR 85 | Ht 68.0 in | Wt 205.8 lb

## 2019-04-26 DIAGNOSIS — E109 Type 1 diabetes mellitus without complications: Secondary | ICD-10-CM

## 2019-04-26 LAB — POCT GLYCOSYLATED HEMOGLOBIN (HGB A1C): Hemoglobin A1C: 14.9 % — AB (ref 4.0–5.6)

## 2019-04-26 NOTE — Progress Notes (Signed)
Subjective:    Patient ID: Alec Jacobson, male    DOB: 01-09-1946, 74 y.o.   MRN: [id number]  HPI Pt returns for f/u of diabetes mellitus:  DM type: 1  Dx'ed: 2017.  Complications: renal insuff.  Therapy: insulin since dx.  DKA: mild, in 2017.   Severe hypoglycemia: never.   Pancreatitis: never.   Other: he takes QD insulin, after poor results with multiple daily injections; he declines pump rx.   Interval history: no cbg record, but states cbg's vary from 95-115.  Pt says he never misses the Lantus.  pt states he feels well in general.  No recent steroids.  He says 1 insulin pen lasts 3 days, and 4 boxes last 3 mos.   Past Medical History:  Diagnosis Date  . Cancer [facility])    prostate  . Chronic kidney disease    over 30 yrs ago, had one stone  . H/O allergic rhinitis    job related  . Hypertension     Past Surgical History:  Procedure Laterality Date  . NASAL SINUS SURGERY    . SINUS ENDO WITH FUSION Bilateral 04/13/2014   Procedure: SINUS ENDO WITH FUSION;  Surgeon: Ruby Cola, MD;  Location: [place];  Service: ENT;  Laterality: Bilateral;  . TONSILLECTOMY      Social History   Socioeconomic History  . Marital status: Married    Spouse name: Not on file  . Number of children: Not on file  . Years of education: Not on file  . Highest education level: Not on file  Occupational History  . Not on file  Tobacco Use  . Smoking status: Never Smoker  . Smokeless tobacco: Never Used  Substance and Sexual Activity  . Alcohol use: No  . Drug use: No  . Sexual activity: Not on file  Other Topics Concern  . Not on file  Social History Narrative  . Not on file   Social Determinants of Health   Financial Resource Strain:   . Difficulty of Paying Living Expenses: Not on file  Food Insecurity:   . Worried About Charity fundraiser in the Last Year: Not on file  . Ran Out of Food in the Last Year: Not on file  Transportation Needs:   . Lack of Transportation  (Medical): Not on file  . Lack of Transportation (Non-Medical): Not on file  Physical Activity:   . Days of Exercise per Week: Not on file  . Minutes of Exercise per Session: Not on file  Stress:   . Feeling of Stress : Not on file  Social Connections:   . Frequency of Communication with Friends and Family: Not on file  . Frequency of Social Gatherings with Friends and Family: Not on file  . Attends Religious Services: Not on file  . Active Member of Clubs or Organizations: Not on file  . Attends Archivist Meetings: Not on file  . Marital Status: Not on file  Intimate Partner Violence:   . Fear of Current or Ex-Partner: Not on file  . Emotionally Abused: Not on file  . Physically Abused: Not on file  . Sexually Abused: Not on file    Current Outpatient Medications on File Prior to Visit  Medication Sig Dispense Refill  . amLODipine (NORVASC) 5 MG tablet Take 1 tablet (5 mg total) by mouth daily. 30 tablet 1  . blood glucose meter kit and supplies Dispense based on patient and insurance preference. Use up to  four times daily as directed. (FOR ICD-9 250.00, 250.01). 1 each 0  . Insulin Pen Needle 32G X 4 MM MISC 1 each by Other route daily. Use with insulin pens to dispense insulin as directed 90 each 1  . rosuvastatin (CRESTOR) 20 MG tablet Take 20 mg by mouth daily.     No current facility-administered medications on file prior to visit.    No Known Allergies  Family History  Problem Relation Age of Onset  . Aneurysm Father 53  . Diabetes Sister     BP (!) 160/98 (BP Location: Left Arm, Patient Position: Sitting, Cuff Size: Normal)   Pulse 85   Ht '5\' 8"'$  (1.727 m)   Wt 205 lb 12.8 oz (93.4 kg)   SpO2 98%   BMI 31.29 kg/m    Review of Systems He has lost 11 lbs since last ov.  He leg edema.      Objective:   Physical Exam VITAL SIGNS:  See vs page GENERAL: no distress Pulses: dorsalis pedis intact bilat.   MSK: no deformity of the feet CV: no leg  edema Skin:  no ulcer on the feet.  normal color and temp on the feet. Neuro: sensation is intact to touch on the feet  . Lab Results  Component Value Date   HGBA1C 14.9 (A) 04/26/2019       Assessment & Plan:  HTN: is noted today.  Type 1 DM, with renal insuff: severe exacerbation.  Medication error.  We discussed proper dosage.  He is not a candidate for multiple daily injections.  Discrepancy between A1c and reported cbg's.  Check fructosamine.  Patient Instructions  Your blood pressure is high today.  Please see your primary care provider soon, to have it rechecked A different type of diabetes blood test is requested for you today.  We'll let you know about the results.  check your blood sugar twice a day.  vary the time of day when you check, between before the 3 meals, and at bedtime.  also check if you have symptoms of your blood sugar being too high or too low.  please keep a record of the readings and bring it to your next appointment here (or you can bring the meter itself).  You can write it on any piece of paper.  please call us sooner if your blood sugar goes below 70, or if you have a lot of readings over 200. Please come back for a follow-up appointment in 2 months.

## 2019-04-26 NOTE — Patient Instructions (Addendum)
Your blood pressure is high today.  Please see your primary care provider soon, to have it rechecked A different type of diabetes blood test is requested for you today.  We'll let you know about the results.  check your blood sugar twice a day.  vary the time of day when you check, between before the 3 meals, and at bedtime.  also check if you have symptoms of your blood sugar being too high or too low.  please keep a record of the readings and bring it to your next appointment here (or you can bring the meter itself).  You can write it on any piece of paper.  please call us sooner if your blood sugar goes below 70, or if you have a lot of readings over 200. Please come back for a follow-up appointment in 2 months.

## 2019-04-28 LAB — FRUCTOSAMINE: Fructosamine: 573 umol/L — ABNORMAL HIGH (ref 205–285)

## 2019-04-29 ENCOUNTER — Telehealth: Payer: Self-pay

## 2019-04-29 MED ORDER — LANTUS SOLOSTAR 100 UNIT/ML ~~LOC~~ SOPN: 50.0000 [IU] | PEN_INJECTOR | SUBCUTANEOUS | 3 refills | Status: DC

## 2019-04-29 NOTE — Telephone Encounter (Signed)
-----   Message from Renato Shin, MD sent at 04/28/2019  6:51 PM EST ----- please contact patient: This test confirms the a1c, which is really high.  Please increase the insulin to 50 units each morning.  Please come back for a follow-up appointment in 2 months.

## 2019-04-29 NOTE — Telephone Encounter (Signed)
Lab results reviewed by Dr. Ellison. Called pt to inform about lab results as well as new orders. Using closed-loop communication, pt verbalized complete acceptance and understanding of all information provided. No further questions nor concerns were voiced at this time. 

## 2019-05-03 ENCOUNTER — Other Ambulatory Visit: Payer: Self-pay

## 2019-05-03 DIAGNOSIS — E109 Type 1 diabetes mellitus without complications: Secondary | ICD-10-CM

## 2019-05-03 MED ORDER — INSULIN PEN NEEDLE 32G X 4 MM MISC: 1.0000 | Freq: Every day | 11 refills | Status: DC

## 2019-05-15 ENCOUNTER — Ambulatory Visit: Payer: Medicare Other | Attending: Internal Medicine

## 2019-05-15 DIAGNOSIS — Z23 Encounter for immunization: Secondary | ICD-10-CM | POA: Insufficient documentation

## 2019-05-15 NOTE — Progress Notes (Signed)
   Covid-19 Vaccination Clinic  Name:  Alec Jacobson    MRN: [id number] DOB: 12-13-1945  05/15/2019  [name] was observed post Covid-19 immunization for 15 minutes without incidence. He was provided with Vaccine Information Sheet and instruction to access the V-Safe system.   [name] was instructed to call 911 with any severe reactions post vaccine: Marland Kitchen Difficulty breathing  . Swelling of your face and throat  . A fast heartbeat  . A bad rash all over your body  . Dizziness and weakness    Immunizations Administered    Name Date Dose VIS Date Route   Pfizer COVID-19 Vaccine 05/15/2019 10:10 AM 0.3 mL 03/05/2019 Intramuscular   Manufacturer: [place]   Lot: [id number]   [name]: [id number]

## 2019-06-07 ENCOUNTER — Ambulatory Visit: Payer: Medicare Other | Attending: Internal Medicine

## 2019-06-07 DIAGNOSIS — Z23 Encounter for immunization: Secondary | ICD-10-CM

## 2019-06-07 NOTE — Progress Notes (Signed)
   Covid-19 Vaccination Clinic  Name:  Alec Jacobson    MRN: [id number] DOB: 19-May-1945  06/07/2019  [name] was observed post Covid-19 immunization for 15 minutes without incident. He was provided with Vaccine Information Sheet and instruction to access the V-Safe system.   [name] was instructed to call 911 with any severe reactions post vaccine: Marland Kitchen Difficulty breathing  . Swelling of face and throat  . A fast heartbeat  . A bad rash all over body  . Dizziness and weakness   Immunizations Administered    Name Date Dose VIS Date Route   Pfizer COVID-19 Vaccine 06/07/2019  8:57 AM 0.3 mL 03/05/2019 Intramuscular   Manufacturer: [place]   Lot: [id number]   [place]: [id number]

## 2019-06-23 ENCOUNTER — Other Ambulatory Visit: Payer: Self-pay

## 2019-06-28 ENCOUNTER — Ambulatory Visit (INDEPENDENT_AMBULATORY_CARE_PROVIDER_SITE_OTHER): Payer: Medicare Other | Admitting: Endocrinology

## 2019-06-28 ENCOUNTER — Other Ambulatory Visit: Payer: Self-pay

## 2019-06-28 ENCOUNTER — Encounter: Payer: Self-pay | Admitting: Endocrinology

## 2019-06-28 VITALS — BP 162/90 | HR 90 | Ht 68.0 in | Wt 206.6 lb

## 2019-06-28 DIAGNOSIS — E109 Type 1 diabetes mellitus without complications: Secondary | ICD-10-CM

## 2019-06-28 LAB — POCT GLYCOSYLATED HEMOGLOBIN (HGB A1C): Hemoglobin A1C: 14.9 % — AB (ref 4.0–5.6)

## 2019-06-28 NOTE — Progress Notes (Signed)
Subjective:    Patient ID: Alec Jacobson, male    DOB: 23-Apr-1945, 74 y.o.   MRN: [id number]  HPI Pt returns for f/u of diabetes mellitus:  DM type: 1  Dx'ed: 2017.  Complications: renal insuff.  Therapy: insulin since dx.  DKA: mild, in 2017.   Severe hypoglycemia: never.   Pancreatitis: never.   Other: he takes QD insulin, after poor results with multiple daily injections; he declines pump rx; fructosamine has confirmed A1c.   Interval history: no cbg record, but states cbg's are in the 100's.  Pt says he never misses the Lantus.  pt states he feels well in general.  No recent steroids.  Past Medical History:  Diagnosis Date  . Cancer [facility])    prostate  . Chronic kidney disease    over 30 yrs ago, had one stone  . H/O allergic rhinitis    job related  . Hypertension     Past Surgical History:  Procedure Laterality Date  . NASAL SINUS SURGERY    . SINUS ENDO WITH FUSION Bilateral 04/13/2014   Procedure: SINUS ENDO WITH FUSION;  Surgeon: Ruby Cola, MD;  Location: [place];  Service: ENT;  Laterality: Bilateral;  . TONSILLECTOMY      Social History   Socioeconomic History  . Marital status: Married    Spouse name: Not on file  . Number of children: Not on file  . Years of education: Not on file  . Highest education level: Not on file  Occupational History  . Not on file  Tobacco Use  . Smoking status: Never Smoker  . Smokeless tobacco: Never Used  Substance and Sexual Activity  . Alcohol use: No  . Drug use: No  . Sexual activity: Not on file  Other Topics Concern  . Not on file  Social History Narrative  . Not on file   Social Determinants of Health   Financial Resource Strain:   . Difficulty of Paying Living Expenses:   Food Insecurity:   . Worried About Charity fundraiser in the Last Year:   . Arboriculturist in the Last Year:   Transportation Needs:   . Film/video editor (Medical):   Marland Kitchen Lack of Transportation (Non-Medical):   Physical  Activity:   . Days of Exercise per Week:   . Minutes of Exercise per Session:   Stress:   . Feeling of Stress :   Social Connections:   . Frequency of Communication with Friends and Family:   . Frequency of Social Gatherings with Friends and Family:   . Attends Religious Services:   . Active Member of Clubs or Organizations:   . Attends Archivist Meetings:   Marland Kitchen Marital Status:   Intimate Partner Violence:   . Fear of Current or Ex-Partner:   . Emotionally Abused:   Marland Kitchen Physically Abused:   . Sexually Abused:     Current Outpatient Medications on File Prior to Visit  Medication Sig Dispense Refill  . amLODipine (NORVASC) 5 MG tablet Take 1 tablet (5 mg total) by mouth daily. 30 tablet 1  . blood glucose meter kit and supplies Dispense based on patient and insurance preference. Use up to four times daily as directed. (FOR ICD-9 250.00, 250.01). 1 each 0  . Insulin Glargine (LANTUS SOLOSTAR) 100 UNIT/ML Solostar Pen Inject 50 Units into the skin every morning. And pen needles 1/day 20 pen 3  . Insulin Pen Needle 32G X 4 MM MISC  1 each by Other route daily. Use with insulin pens to dispense insulin as directed 90 each 11  . rosuvastatin (CRESTOR) 20 MG tablet Take 20 mg by mouth daily.     No current facility-administered medications on file prior to visit.    No Known Allergies  Family History  Problem Relation Age of Onset  . Aneurysm Father 62  . Diabetes Sister     BP (!) 162/90   Pulse 90   Ht _0  (1.727 m)   Wt 206 lb 9.6 oz (93.7 kg)   SpO2 99%   BMI 31.41 kg/m    Review of Systems No weight change    Objective:   Physical Exam VITAL SIGNS:  See vs page GENERAL: no distress Pulses: dorsalis pedis intact bilat.   MSK: no deformity of the feet CV: no leg edema Skin:  no ulcer on the feet.  normal color and temp on the feet. Neuro: sensation is intact to touch on the feet.    Lab Results  Component Value Date   HGBA1C 14.9 (A) 06/28/2019        Assessment & Plan:  HTN: is noted today Type 1 DM: very poor glycemic control.  This is evidence of noncompliance with insulin.  Patient Instructions  Your blood pressure is high today.  Please see your primary care provider soon, to have it rechecked.   Please see a diabetes educator, to look for a reason why the blood sugar is so high.  check your blood sugar twice a day.  vary the time of day when you check, between before the 3 meals, and at bedtime.  also check if you have symptoms of your blood sugar being too high or too low.  please keep a record of the readings and bring it to your next appointment here (or you can bring the meter itself).  You can write it on any piece of paper.  please call us sooner if your blood sugar goes below 70, or if you have a lot of readings over 200. Please come back for a follow-up appointment in 2 months.

## 2019-06-28 NOTE — Patient Instructions (Addendum)
Your blood pressure is high today.  Please see your primary care provider soon, to have it rechecked.   Please see a diabetes educator, to look for a reason why the blood sugar is so high.  check your blood sugar twice a day.  vary the time of day when you check, between before the 3 meals, and at bedtime.  also check if you have symptoms of your blood sugar being too high or too low.  please keep a record of the readings and bring it to your next appointment here (or you can bring the meter itself).  You can write it on any piece of paper.  please call us sooner if your blood sugar goes below 70, or if you have a lot of readings over 200. Please come back for a follow-up appointment in 2 months.

## 2019-07-20 ENCOUNTER — Encounter: Payer: Medicare Other | Attending: Endocrinology | Admitting: Dietician

## 2019-07-20 ENCOUNTER — Encounter: Payer: Self-pay | Admitting: Dietician

## 2019-07-20 ENCOUNTER — Other Ambulatory Visit: Payer: Self-pay

## 2019-07-20 DIAGNOSIS — E109 Type 1 diabetes mellitus without complications: Secondary | ICD-10-CM | POA: Diagnosis present

## 2019-07-20 NOTE — Progress Notes (Signed)
Diabetes Self-Management Education  Visit Type: First/Initial  Appt. Start Time: 1440 Appt. End Time: 1700  07/20/2019  [name], identified by name and date of birth, is a 74 y.o. male with a diagnosis of Diabetes: Type 1.   ASSESSMENT Patient is here today with his wife.  He would like to learn to eat to manage his blood sugar.    History includes Type 2 Diabetes diagnosed in 2017 and HTN. A1C 14.9% both 04/26/2019 and 06/28/2019, Fructosamine 04/26/19 573, GFR 90 02/22/2019. Weight overall stable 198-206 lbs in the past 4+ years. Medications include 50 units of Lantus q am.  He denies forgetting this.  We discussed better site rotation and better technique to prevent insulin from leaking back out.  Noted issues with a more complex insulin regime in the past.  Patient states that he is willing to take meal time insulin and verbalized better control when he was on this.   He does not like to check his blood sugar and only checks this once daily before breakfast. Mentioned options of insulin pumps as well as V-go for alternatives for improved control.  Discussed that he would need to be on multiple injections of insulin and check his blood glucose 4 times daily in order to meet prescription criteria.  He and his wife showed interest in learning generally about these options but needs to meet prescription criteria.  He states that his primary physician recommended a vitamin D supplement. Weight 205 lbs 07/20/2019 198 lbs 03/2019 after a Alec Jacobson fast (no sweets, no meat for 21 days).  He states that he had increased energy and less bloating.  Patient lives with his wife.  She does the shopping and cooking.  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 Currently he does a lot of outside work at his home (with a lot of acreage) as well as caring for his mother's and aunt's property. Less physically active since [place].  Height 5\' 8"  (1.727 m), weight 205 lb (93 kg). Body  mass index is 31.17 kg/m.  Diabetes Self-Management Education - 07/20/19 1542      Visit Information   Visit Type  First/Initial      Initial Visit   Diabetes Type  Type 1    Are you currently following a meal plan?  No    Are you taking your medications as prescribed?  No    Date Diagnosed  2017      Health Coping   How would you rate your overall health?  Good      Psychosocial Assessment   Patient Belief/Attitude about Diabetes  Motivated to manage diabetes    Self-care barriers  None    Self-management support  Doctor's office;Family    Other persons present  Patient;Spouse/SO    Patient Concerns  Nutrition/Meal planning;Glycemic Control    Special Needs  None    Preferred Learning Style  No preference indicated    Learning Readiness  Ready    How often do you need to have someone help you when you read instructions, pamphlets, or other written materials from your doctor or pharmacy?  1 - Never    What is the last grade level you completed in school?  college      Pre-Education Assessment   Patient understands the diabetes disease and treatment process.  Needs Instruction    Patient understands incorporating nutritional management into lifestyle.  Needs Instruction    Patient undertands incorporating physical activity into lifestyle.  Needs Instruction    Patient understands using medications safely.  Needs Instruction    Patient understands monitoring blood glucose, interpreting and using results  Needs Instruction    Patient understands prevention, detection, and treatment of acute complications.  Needs Instruction    Patient understands prevention, detection, and treatment of chronic complications.  Needs Instruction    Patient understands how to develop strategies to address psychosocial issues.  Needs Instruction    Patient understands how to develop strategies to promote health/change behavior.  Needs Instruction      Complications   Last HgB A1C per patient/outside  source  14.9 %   06/2019 and 06/2019   How often do you check your blood sugar?  1-2 times/day    Fasting Blood glucose range (mg/dL)  130-179   160 consistently even since insulin has been increased   Have you had a dilated eye exam in the past 12 months?  Yes    Have you had a dental exam in the past 12 months?  Yes    Are you checking your feet?  Yes    How many days per week are you checking your feet?  2      Dietary Intake   Breakfast  cereal (raisin bran), 2% milk OR eggs, occasional bacon, [employer] toast or English muffin, occasional grits OR oatmeal (instant) - 1 pack   8:30-9   Snack (morning)  none    Lunch  leftovers OR fast food (burger or chicken sandwich or subway)    Snack (afternoon)  none    Development worker, community OR [employer] chicken sausage or cube steak or [place] meatloaf with vegetables, brown rice or sweet potatoes, occasional white potato    Snack (evening)  bologna and cheese sandwich on [employer] with mustard  OR grapes OR peanut butter on 6 crackers   this is my weekness   Beverage(s)  2% milk (4 cups per day), recently stopped diet soda, water, coffee with cream and stevia      Exercise   Exercise Type  Light (walking / raking leaves)   yard work   How many days per week to you exercise?  2    How many minutes per day do you exercise?  60    Total minutes per week of exercise  120      Patient Education   Previous Diabetes Education  Yes (please comment)    Disease state   Definition of diabetes, type 1 and 2, and the diagnosis of diabetes    Nutrition management   Role of diet in the treatment of diabetes and the relationship between the three main macronutrients and blood glucose level;Food label reading, portion sizes and measuring food.;Carbohydrate counting;Meal options for control of blood glucose level and chronic complications.;Information on hints to eating out and maintain blood glucose control.    Physical activity and exercise   Role of exercise on diabetes  management, blood pressure control and cardiac health.    Medications  Taught/reviewed insulin injection, site rotation, insulin storage and needle disposal.;Reviewed patients medication for diabetes, action, purpose, timing of dose and side effects.    Monitoring  Purpose and frequency of SMBG.;Identified appropriate SMBG and/or A1C goals.;Daily foot exams;Yearly dilated eye exam    Acute complications  Taught treatment of hypoglycemia - the 15 rule.    Chronic complications  Dental care;Retinopathy and reason for yearly dilated eye exams;Relationship between chronic complications and blood glucose control      Individualized  Goals (developed by patient)   Nutrition  Follow meal plan discussed    Physical Activity  Exercise 5-7 days per week;30 minutes per day    Medications  take my medication as prescribed    Monitoring   test my blood glucose as discussed    Reducing Risk  increase portions of healthy fats    Health Coping  discuss diabetes with (comment)      Post-Education Assessment   Patient understands the diabetes disease and treatment process.  Demonstrates understanding / competency    Patient understands incorporating nutritional management into lifestyle.  Needs Review    Patient undertands incorporating physical activity into lifestyle.  Demonstrates understanding / competency    Patient understands using medications safely.  Needs Review    Patient understands monitoring blood glucose, interpreting and using results  Demonstrates understanding / competency    Patient understands prevention, detection, and treatment of acute complications.  Demonstrates understanding / competency    Patient understands prevention, detection, and treatment of chronic complications.  Demonstrates understanding / competency    Patient understands how to develop strategies to address psychosocial issues.  Needs Review    Patient understands how to develop strategies to promote health/change  behavior.  Needs Review      Outcomes   Expected Outcomes  Demonstrated interest in learning. Expect positive outcomes    Future DMSE  2 months    Program Status  Not Completed       Individualized Plan for Diabetes Self-Management Training:   Learning Objective:  Patient will have a greater understanding of diabetes self-management. Patient education plan is to attend individual and/or group sessions per assessed needs and concerns.   Plan:   Patient Instructions  When giving the insulin, count to 10 after pushing the insulin and and also be sure to rotate your insulin injection site.  Recommend checking your feet daily. Aim to be active most days.  Evaluate whether the work you are doing is physical activity.  Consider label reading, especially when eating out.  The Calorie [employer] Pace app can be helpful.  Consider alternative insulin deliver options such as the V-go insulin delivery system, Omnipod dash, Medtronic, or Tandem insulin pump.  To qualify, you would need multiple injections of insulin daily and proof of checking your blood sugar 4 times daily.  Aim for 3-4 Carb Choices per meal (45-60 grams) +/- 1 either way  Aim for 0-1 Carbs per snack if hungry  Include protein in moderation with your meals and snacks Consider reading food labels for Total Carbohydrate of foods Consider checking BG at alternate times per day  Continue taking medication  as directed by MD    Expected Outcomes:  Demonstrated interest in learning. Expect positive outcomes  Education material provided: ADA - How to Thrive: A Guide for Your Journey with Diabetes, Meal plan card, Snack sheet and Diabetes Resources  If problems or questions, patient to contact team via:  Phone and Email  Future DSME appointment: 2 months

## 2019-07-20 NOTE — Patient Instructions (Addendum)
When giving the insulin, count to 10 after pushing the insulin and and also be sure to rotate your insulin injection site.  Recommend checking your feet daily. Aim to be active most days.  Evaluate whether the work you are doing is physical activity.  Consider label reading, especially when eating out.  The Calorie [employer] Pace app can be helpful.  Consider alternative insulin deliver options such as the V-go insulin delivery system, Omnipod dash, Medtronic, or Tandem insulin pump.  To qualify, you would need multiple injections of insulin daily and proof of checking your blood sugar 4 times daily.  Aim for 3-4 Carb Choices per meal (45-60 grams) +/- 1 either way  Aim for 0-1 Carbs per snack if hungry  Include protein in moderation with your meals and snacks Consider reading food labels for Total Carbohydrate of foods Consider checking BG at alternate times per day  Continue taking medication  as directed by MD

## 2019-08-25 ENCOUNTER — Other Ambulatory Visit: Payer: Self-pay

## 2019-08-25 ENCOUNTER — Encounter: Payer: Self-pay | Admitting: Endocrinology

## 2019-08-25 ENCOUNTER — Ambulatory Visit: Payer: Medicare Other | Admitting: Endocrinology

## 2019-08-25 VITALS — BP 142/90 | HR 94 | Ht 68.0 in | Wt 211.0 lb

## 2019-08-25 DIAGNOSIS — E109 Type 1 diabetes mellitus without complications: Secondary | ICD-10-CM

## 2019-08-25 LAB — POCT GLYCOSYLATED HEMOGLOBIN (HGB A1C): Hemoglobin A1C: 10.7 % — AB (ref 4.0–5.6)

## 2019-08-25 MED ORDER — TRULICITY 0.75 MG/0.5ML ~~LOC~~ SOAJ: 0.7500 mg | SUBCUTANEOUS | 3 refills | Status: DC

## 2019-08-25 NOTE — Patient Instructions (Addendum)
Your blood pressure is high today.  Please see your primary care provider soon, to have it rechecked.   Please continue the same insulin I have sent a prescription to your pharmacy, to add "Trulicity." check your blood sugar twice a day.  vary the time of day when you check, between before the 3 meals, and at bedtime.  also check if you have symptoms of your blood sugar being too high or too low.  please keep a record of the readings and bring it to your next appointment here (or you can bring the meter itself).  You can write it on any piece of paper.  please call us sooner if your blood sugar goes below 70, or if you have a lot of readings over 200. Please come back for a follow-up appointment in 3 months.

## 2019-08-25 NOTE — Progress Notes (Signed)
Subjective:    Patient ID: Alec Jacobson, male    DOB: 03-Nov-1945, 74 y.o.   MRN: [id number]  HPI Pt returns for f/u of diabetes mellitus:  DM type: 1  Dx'ed: 2017.  Complications: renal insuff.  Therapy: insulin since dx.  DKA: mild, in 2017.   Severe hypoglycemia: never.   Pancreatitis: never.   Other: he takes QD insulin, after poor results with multiple daily injections; he declines pump rx; fructosamine has confirmed A1c.   Interval history: He brings a record of his cbg's which I have reviewed today.  cbg's vary from 156-278.  Pt says he never misses the Lantus.  pt states he feels well in general.  In particular, fatigue is less now.  No recent steroids.  Past Medical History:  Diagnosis Date  . Cancer [facility])    prostate  . Chronic kidney disease    over 30 yrs ago, had one stone  . Diabetes mellitus without complication ([place])   . H/O allergic rhinitis    job related  . Hypertension     Past Surgical History:  Procedure Laterality Date  . NASAL SINUS SURGERY    . SINUS ENDO WITH FUSION Bilateral 04/13/2014   Procedure: SINUS ENDO WITH FUSION;  Surgeon: Ruby Cola, MD;  Location: [place];  Service: ENT;  Laterality: Bilateral;  . TONSILLECTOMY      Social History   Socioeconomic History  . Marital status: Married    Spouse name: Not on file  . Number of children: Not on file  . Years of education: Not on file  . Highest education level: Not on file  Occupational History  . Not on file  Tobacco Use  . Smoking status: Never Smoker  . Smokeless tobacco: Never Used  Substance and Sexual Activity  . Alcohol use: No  . Drug use: No  . Sexual activity: Not on file  Other Topics Concern  . Not on file  Social History Narrative  . Not on file   Social Determinants of Health   Financial Resource Strain:   . Difficulty of Paying Living Expenses:   Food Insecurity:   . Worried About Charity fundraiser in the Last Year:   . Arboriculturist in the Last Year:    Transportation Needs:   . Film/video editor (Medical):   Marland Kitchen Lack of Transportation (Non-Medical):   Physical Activity:   . Days of Exercise per Week:   . Minutes of Exercise per Session:   Stress:   . Feeling of Stress :   Social Connections:   . Frequency of Communication with Friends and Family:   . Frequency of Social Gatherings with Friends and Family:   . Attends Religious Services:   . Active Member of Clubs or Organizations:   . Attends Archivist Meetings:   Marland Kitchen Marital Status:   Intimate Partner Violence:   . Fear of Current or Ex-Partner:   . Emotionally Abused:   Marland Kitchen Physically Abused:   . Sexually Abused:     Current Outpatient Medications on File Prior to Visit  Medication Sig Dispense Refill  . amLODipine (NORVASC) 5 MG tablet Take 1 tablet (5 mg total) by mouth daily. 30 tablet 1  . blood glucose meter kit and supplies Dispense based on patient and insurance preference. Use up to four times daily as directed. (FOR ICD-9 250.00, 250.01). 1 each 0  . Insulin Glargine (LANTUS SOLOSTAR) 100 UNIT/ML Solostar Pen Inject 50 Units  into the skin every morning. And pen needles 1/day 20 pen 3  . Insulin Pen Needle 32G X 4 MM MISC 1 each by Other route daily. Use with insulin pens to dispense insulin as directed 90 each 11  . rosuvastatin (CRESTOR) 20 MG tablet Take 20 mg by mouth daily.     No current facility-administered medications on file prior to visit.    No Known Allergies  Family History  Problem Relation Age of Onset  . Aneurysm Father 34  . Diabetes Sister     BP (!) 142/90   Pulse 94   Ht _0  (1.727 m)   Wt 211 lb (95.7 kg)   SpO2 96%   BMI 32.08 kg/m    Review of Systems He denies hypoglycemia.      Objective:   Physical Exam VITAL SIGNS:  See vs page GENERAL: no distress Pulses: dorsalis pedis intact bilat.   MSK: no deformity of the feet CV: no leg edema Skin:  no ulcer on the feet.  normal color and temp on the  feet. Neuro: sensation is intact to touch on the feet  A1c=10.7%    Assessment & Plan:  HTN: is noted today Type 1 DM: he needs increased rx Obesity: adding Trulicity might help  Patient Instructions  Your blood pressure is high today.  Please see your primary care provider soon, to have it rechecked.   Please continue the same insulin I have sent a prescription to your pharmacy, to add "Trulicity." check your blood sugar twice a day.  vary the time of day when you check, between before the 3 meals, and at bedtime.  also check if you have symptoms of your blood sugar being too high or too low.  please keep a record of the readings and bring it to your next appointment here (or you can bring the meter itself).  You can write it on any piece of paper.  please call us sooner if your blood sugar goes below 70, or if you have a lot of readings over 200. Please come back for a follow-up appointment in 3 months.

## 2019-09-13 ENCOUNTER — Encounter: Payer: Medicare Other | Attending: Endocrinology | Admitting: Dietician

## 2019-09-13 ENCOUNTER — Other Ambulatory Visit: Payer: Self-pay

## 2019-09-13 ENCOUNTER — Encounter: Payer: Self-pay | Admitting: Dietician

## 2019-09-13 DIAGNOSIS — E119 Type 2 diabetes mellitus without complications: Secondary | ICD-10-CM

## 2019-09-13 DIAGNOSIS — E109 Type 1 diabetes mellitus without complications: Secondary | ICD-10-CM | POA: Diagnosis not present

## 2019-09-13 NOTE — Patient Instructions (Signed)
Great job on the changes that you are making! Stay active daily Continue to drink beverages without carbohydrates  Continue to stay mindful about your food choices. Continue to read labels for total carbohydrate.

## 2019-09-15 NOTE — Progress Notes (Signed)
Diabetes Self-Management Education  Visit Type: Follow-up  Appt. Start Time: 0935 Appt. End Time: 1005  09/15/2019  [name], identified by name and date of birth, is a 74 y.o. male with a diagnosis of Diabetes: Type 1.   ASSESSMENT  Weight 211 lb (95.7 kg). Body mass index is 32.08 kg/m.   Diabetes Self-Management Education - 09/15/19 0042      Visit Information   Visit Type Follow-up      Initial Visit   Diabetes Type Type 1    Are you currently following a meal plan? Yes   diabetic   Are you taking your medications as prescribed? Yes      Psychosocial Assessment   Other persons present Patient;Spouse/SO      Pre-Education Assessment   Patient understands the diabetes disease and treatment process. Demonstrates understanding / competency    Patient understands incorporating nutritional management into lifestyle. Needs Review    Patient undertands incorporating physical activity into lifestyle. Needs Review    Patient understands using medications safely. Needs Review    Patient understands monitoring blood glucose, interpreting and using results Demonstrates understanding / competency    Patient understands prevention, detection, and treatment of acute complications. Demonstrates understanding / competency    Patient understands prevention, detection, and treatment of chronic complications. Demonstrates understanding / competency    Patient understands how to develop strategies to address psychosocial issues. Needs Review    Patient understands how to develop strategies to promote health/change behavior. Needs Review      Complications   Last HgB A1C per patient/outside source 10.7 %   08/25/19 decreased from 14.9% 06/28/2019   How often do you check your blood sugar? 3-4 times / week    Fasting Blood glucose range (mg/dL) 70-129;130-179;180-200;>200    Postprandial Blood glucose range (mg/dL) >200;180-200    Number of hypoglycemic episodes per month 0    Number  of hyperglycemic episodes per week 1      Dietary Intake   Breakfast 2% milk, grain berry cereal OR egg, bacon or sausage, occasional hash brown    Lunch deli meat sandwich or Leftovers    Dinner baked chicken or pork chop, brown rice, vegetables OR spaghetti, salad    Snack (evening) occasional popcorn    Beverage(s) water, regular gingerale 8 oz occasionally, tea with splenda      Exercise   Exercise Type Light (walking / raking leaves)    How many days per week to you exercise? 7    How many minutes per day do you exercise? 60    Total minutes per week of exercise 420      Patient Education   Previous Diabetes Education Yes (please comment)   06/2019   Nutrition management  Information on hints to eating out and maintain blood glucose control.;Meal options for control of blood glucose level and chronic complications.    Medications Reviewed patients medication for diabetes, action, purpose, timing of dose and side effects.    Psychosocial adjustment Worked with patient to identify barriers to care and solutions      Individualized Goals (developed by patient)   Nutrition General guidelines for healthy choices and portions discussed    Physical Activity Exercise 5-7 days per week;60 minutes per day    Medications take my medication as prescribed    Monitoring  test my blood glucose as discussed    Reducing Risk examine blood glucose patterns    Health Coping discuss diabetes with (comment)  MD, RD, [place]     Patient Self-Evaluation of Goals - Patient rates self as meeting previously set goals (% of time)   Nutrition >75%    Physical Activity >75%    Medications >75%    Monitoring 50 - 75 %    Problem Solving >75%    Reducing Risk 50 - 75 %    Health Coping >75%      Post-Education Assessment   Patient understands the diabetes disease and treatment process. Demonstrates understanding / competency    Patient understands incorporating nutritional management into lifestyle.  Needs Review    Patient undertands incorporating physical activity into lifestyle. Demonstrates understanding / competency    Patient understands using medications safely. Demonstrates understanding / competency    Patient understands monitoring blood glucose, interpreting and using results Demonstrates understanding / competency    Patient understands prevention, detection, and treatment of acute complications. Demonstrates understanding / competency    Patient understands prevention, detection, and treatment of chronic complications. Demonstrates understanding / competency    Patient understands how to develop strategies to address psychosocial issues. Demonstrates understanding / competency    Patient understands how to develop strategies to promote health/change behavior. Needs Review      Outcomes   Expected Outcomes Demonstrated interest in learning. Expect positive outcomes    Future DMSE 2 months    Program Status Not Completed      Subsequent Visit   Since your last visit have you continued or begun to take your medications as prescribed? Yes    Since your last visit have you experienced any weight changes? Gain    Weight Gain (lbs) 2    Since your last visit, are you checking your blood glucose at least once a day? No          Patient is her today with his wife and was last seen by myself on 07/20/2019. Decreased bread and snacks.  History includes Type 2 Diabetes diagnosed in 2017 and HTN. A1C 14.9% both 04/26/2019 and 06/28/2019, Fructosamine 04/26/19 573, GFR 90 02/22/2019.  A1C reduced to 10.7% 08/25/2019.   Weight increased from 209 lbs to 211 lbs today. Weight overall stable 198-206 lbs in the past 4+ years. Medications include Trulicity and 40 units of Lantus q am.  He denies forgetting this.  We discussed better site rotation and better technique to prevent insulin from leaking back out.  Noted issues with a more complex insulin regime in the past.  He now tests his blood  glucose 3-4 times per week.  No lows and high of 220. He is more active in the yard and has been working on portion control. His wife states that in the past they threw increased amounts of insulin/medication away and this is a lot of money and they do not wish to try another insulin again and report that he likes the current regime.  He states that his primary physician recommended a vitamin D supplement. Weight 205 lbs 07/20/2019 198 lbs 03/2019 after a Quillian Quince fast (no sweets, no meat for 21 days).  He states that he had increased energy and less bloating.  Patient lives with his wife.  She does the shopping and cooking.  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 Currently he does a lot of outside work at his home (with a lot of acreage) as well as caring for his mother's and aunt's property. Less physically active since [name].- improved.  Individualized Plan for Diabetes Self-Management Training:  Learning Objective:  Patient will have a greater understanding of diabetes self-management. Patient education plan is to attend individual and/or group sessions per assessed needs and concerns.   Plan:   Patient Instructions  Doristine Devoid job on the changes that you are making! Stay active daily Continue to drink beverages without carbohydrates  Continue to stay mindful about your food choices. Continue to read labels for total carbohydrate.   Expected Outcomes:  Demonstrated interest in learning. Expect positive outcomes  Education material provided:   If problems or questions, patient to contact team via:  Phone  Future DSME appointment: 2 months

## 2019-11-22 ENCOUNTER — Ambulatory Visit: Payer: Medicare Other | Admitting: Dietician

## 2019-11-25 ENCOUNTER — Other Ambulatory Visit: Payer: Self-pay

## 2019-11-25 ENCOUNTER — Ambulatory Visit (INDEPENDENT_AMBULATORY_CARE_PROVIDER_SITE_OTHER): Payer: Medicare Other | Admitting: Endocrinology

## 2019-11-25 ENCOUNTER — Encounter: Payer: Self-pay | Admitting: Endocrinology

## 2019-11-25 VITALS — BP 138/80 | HR 71 | Ht 68.0 in | Wt 211.0 lb

## 2019-11-25 DIAGNOSIS — E109 Type 1 diabetes mellitus without complications: Secondary | ICD-10-CM | POA: Diagnosis not present

## 2019-11-25 LAB — POCT GLYCOSYLATED HEMOGLOBIN (HGB A1C): Hemoglobin A1C: 6.4 % — AB (ref 4.0–5.6)

## 2019-11-25 MED ORDER — LANTUS SOLOSTAR 100 UNIT/ML ~~LOC~~ SOPN: 45.0000 [IU] | PEN_INJECTOR | SUBCUTANEOUS | 11 refills | Status: DC

## 2019-11-25 NOTE — Progress Notes (Signed)
Subjective:    Patient ID: Alec Jacobson, male    DOB: 12/13/1945, 74 y.o.   MRN: [id number]  HPI Pt returns for f/u of diabetes mellitus:  DM type: 1  Dx'ed: 2017.  Complications: renal insuff.  Therapy: insulin since dx, and Trulicity DKA: mild, in 2017.   Severe hypoglycemia: never.   Pancreatitis: never.   Other: he takes QD insulin, after poor results with multiple daily injections; he declines pump rx; fructosamine has confirmed A1c.   Interval history: no cbg record, but states cbg's vary from 135-140.  Pt says he never misses meds  pt states he feels well in general.  No recent steroids.  Past Medical History:  Diagnosis Date  . Cancer [facility])    prostate  . Chronic kidney disease    over 30 yrs ago, had one stone  . Diabetes mellitus without complication ([place])   . H/O allergic rhinitis    job related  . Hypertension     Past Surgical History:  Procedure Laterality Date  . NASAL SINUS SURGERY    . SINUS ENDO WITH FUSION Bilateral 04/13/2014   Procedure: SINUS ENDO WITH FUSION;  Surgeon: Ruby Cola, MD;  Location: [facility];  Service: ENT;  Laterality: Bilateral;  . TONSILLECTOMY      Social History   Socioeconomic History  . Marital status: Married    Spouse name: Not on file  . Number of children: Not on file  . Years of education: Not on file  . Highest education level: Not on file  Occupational History  . Not on file  Tobacco Use  . Smoking status: Never Smoker  . Smokeless tobacco: Never Used  Substance and Sexual Activity  . Alcohol use: No  . Drug use: No  . Sexual activity: Not on file  Other Topics Concern  . Not on file  Social History Narrative  . Not on file   Social Determinants of Health   Financial Resource Strain:   . Difficulty of Paying Living Expenses: Not on file  Food Insecurity:   . Worried About Charity fundraiser in the Last Year: Not on file  . Ran Out of Food in the Last Year: Not on file  Transportation Needs:   .  Lack of Transportation (Medical): Not on file  . Lack of Transportation (Non-Medical): Not on file  Physical Activity:   . Days of Exercise per Week: Not on file  . Minutes of Exercise per Session: Not on file  Stress:   . Feeling of Stress : Not on file  Social Connections:   . Frequency of Communication with Friends and Family: Not on file  . Frequency of Social Gatherings with Friends and Family: Not on file  . Attends Religious Services: Not on file  . Active Member of Clubs or Organizations: Not on file  . Attends Archivist Meetings: Not on file  . Marital Status: Not on file  Intimate Partner Violence:   . Fear of Current or Ex-Partner: Not on file  . Emotionally Abused: Not on file  . Physically Abused: Not on file  . Sexually Abused: Not on file    Current Outpatient Medications on File Prior to Visit  Medication Sig Dispense Refill  . amLODipine (NORVASC) 5 MG tablet Take 1 tablet (5 mg total) by mouth daily. 30 tablet 1  . blood glucose meter kit and supplies Dispense based on patient and insurance preference. Use up to four times daily as  directed. (FOR ICD-9 250.00, 250.01). 1 each 0  . Dulaglutide (TRULICITY) 4.93 XL/2.1VG SOPN Inject 0.75 mg into the skin once a week. 12 pen 3  . Insulin Pen Needle 32G X 4 MM MISC 1 each by Other route daily. Use with insulin pens to dispense insulin as directed 90 each 11  . rosuvastatin (CRESTOR) 20 MG tablet Take 20 mg by mouth daily.     No current facility-administered medications on file prior to visit.    No Known Allergies  Family History  Problem Relation Age of Onset  . Aneurysm Father 89  . Diabetes Sister     BP 138/80   Pulse 71   Ht _0  (1.727 m)   Wt 211 lb (95.7 kg)   SpO2 98%   BMI 32.08 kg/m    Review of Systems He denies hypoglycemia.      Objective:   Physical Exam VITAL SIGNS:  See vs page GENERAL: no distress Pulses: dorsalis pedis intact bilat.   MSK: no deformity of the  feet CV: no leg edema Skin:  no ulcer on the feet.  normal color and temp on the feet. Neuro: sensation is intact to touch on the feet  Lab Results  Component Value Date   CREATININE 0.98 02/22/2019   BUN 23 02/22/2019   NA 137 02/22/2019   K 4.2 02/22/2019   CL 101 02/22/2019   CO2 29 02/22/2019     Lab Results  Component Value Date   HGBA1C 6.4 (A) 11/25/2019       Assessment & Plan:  Type 1 DM, with CRI.  Overcontrolled, given this insulin regimen, which does match insulin to her changing needs throughout the day  Patient Instructions  Please reduce the Lantus to 45 units each morning, and continue the same Trulicity check your blood sugar twice a day.  vary the time of day when you check, between before the 3 meals, and at bedtime.  also check if you have symptoms of your blood sugar being too high or too low.  please keep a record of the readings and bring it to your next appointment here (or you can bring the meter itself).  You can write it on any piece of paper.  please call us sooner if your blood sugar goes below 70, or if you have a lot of readings over 200. Please come back for a follow-up appointment in 3 months.

## 2019-11-25 NOTE — Patient Instructions (Addendum)
Please reduce the Lantus to 45 units each morning, and continue the same Trulicity check your blood sugar twice a day.  vary the time of day when you check, between before the 3 meals, and at bedtime.  also check if you have symptoms of your blood sugar being too high or too low.  please keep a record of the readings and bring it to your next appointment here (or you can bring the meter itself).  You can write it on any piece of paper.  please call us sooner if your blood sugar goes below 70, or if you have a lot of readings over 200. Please come back for a follow-up appointment in 3 months.

## 2019-12-13 ENCOUNTER — Encounter: Payer: Self-pay | Admitting: Dietician

## 2019-12-13 ENCOUNTER — Encounter: Payer: Medicare Other | Attending: Endocrinology | Admitting: Dietician

## 2019-12-13 ENCOUNTER — Other Ambulatory Visit: Payer: Self-pay

## 2019-12-13 DIAGNOSIS — E109 Type 1 diabetes mellitus without complications: Secondary | ICD-10-CM

## 2019-12-13 NOTE — Patient Instructions (Addendum)
Sleep routine.  Consider limiting naps to 20 minutes daily. Exercise motivation - Aim for at least 30 minutes most days  Wake well rested  Have your clothes, shoes laid out  Silver Sneakers/ [employer]  Be sure to eat lunch.  Aim for 45 grams of carbs plus protein.

## 2019-12-13 NOTE — Progress Notes (Signed)
Currently he does a lot of outside work at his home (with a lot of acreage) as well as caring for his mother's and aunt's property. Less physically active since [place].- improved.  Diabetes Self-Management Education  Visit Type:  Follow-up  Appt. Start Time: 1200 Appt. End Time: 1230  12/13/2019  [name], identified by name and date of birth, is a 74 y.o. male with a diagnosis of Diabetes:  Type 1 Diabetes.    ASSESSMENT Patient is here today with his wife. Weight stable at 12/04/2019 appointment with MD. A1C has decreased to 6.4% 11/25/2019 from 10.7 08/25/2019. Trulicity is on hold due to patient in the "donut hole" and expense of $600 per month.  They are going to see how his blood sugar responds off of Trulicity currently.  Next MD appointment 02/2020. He continues Lantus 40 units daily. Blood sugar 120 last night and this is typical. Not hungry for lunch and skips this often.   Takes a nap frequently during the day which delays his bed time to 2 am or later. Because he skips lunch he tends to overeat at dinner and snack late as well. States that he needs to increase motivation for exercise and feels that his yard and maintenance work on his home, mom's home and rental property 3 times weekly is not enough. Wife is interested in a support group.  We meet virtually currently.  E-mail has been added to the diabetes support group list.  History includes Type 2 Diabetes diagnosed in 2017 and HTN. A1C 14.9% both 04/26/2019 and 06/28/2019, Fructosamine 04/26/19 573, GFR 90 02/22/2019.  A1C reduced to 10.7% 08/25/2019.   Weight increased from 209 lbs to 211 lbs today. Weight overall stable 198-206 lbs in the past 4+ years. Medications include Trulicity and 40 units of Lantus q am. He denies forgetting this. We discussed better site rotation and better technique to prevent insulin from leaking back out. Noted issues with a more complex insulin regime in the past.  He now tests his blood  glucose 3-4 times per week.  No lows and high of 220. He is more active in the yard and has been working on portion control. His wife states that in the past they threw increased amounts of insulin/medication away and this is a lot of money and they do not wish to try another insulin again and report that he likes the current regime.  Weight 205 lbs 07/20/2019 198 lbs 03/2019 after a Quillian Quince fast (no sweets, no meat for 21 days). He states that he had increased energy and less bloating.  Patient lives with his wife. She does the shopping and cooking. 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 There were no vitals taken for this visit. There is no height or weight on file to calculate BMI.    Diabetes Self-Management Education - 12/13/19 1741      Psychosocial Assessment   Patient Belief/Attitude about Diabetes Motivated to manage diabetes    Self-care barriers None    Self-management support Doctor's office;Family    Special Needs None    Preferred Learning Style No preference indicated      Pre-Education Assessment   Patient understands the diabetes disease and treatment process. Demonstrates understanding / competency    Patient understands incorporating nutritional management into lifestyle. Needs Review    Patient undertands incorporating physical activity into lifestyle. Needs Review    Patient understands using medications safely. Demonstrates understanding / competency    Patient understands  monitoring blood glucose, interpreting and using results Demonstrates understanding / competency    Patient understands prevention, detection, and treatment of chronic complications. Demonstrates understanding / competency    Patient understands how to develop strategies to address psychosocial issues. Demonstrates understanding / competency    Patient understands how to develop strategies to promote health/change behavior. Needs Review      Complications   Last HgB A1C  per patient/outside source 6.4 %   11/25/2019   How often do you check your blood sugar? 1-2 times/day    Fasting Blood glucose range (mg/dL) 70-129    Postprandial Blood glucose range (mg/dL) 130-179    Number of hypoglycemic episodes per month 0      Patient Education   Previous Diabetes Education Yes (please comment)   08/2019 this RD   Nutrition management  Meal timing in regards to the patients' current diabetes medication.;Meal options for control of blood glucose level and chronic complications.    Physical activity and exercise  Other (comment)   discussed motivation   Medications Reviewed patients medication for diabetes, action, purpose, timing of dose and side effects.      Individualized Goals (developed by patient)   Nutrition General guidelines for healthy choices and portions discussed    Physical Activity Exercise 5-7 days per week;30 minutes per day    Medications take my medication as prescribed    Monitoring  test my blood glucose as discussed    Reducing Risk increase portions of healthy fats      Patient Self-Evaluation of Goals - Patient rates self as meeting previously set goals (% of time)   Nutrition >75%    Physical Activity >75%    Medications >75%    Monitoring >75%    Problem Solving >75%    Reducing Risk >75%    Health Coping >75%      Post-Education Assessment   Patient understands the diabetes disease and treatment process. Demonstrates understanding / competency    Patient understands incorporating nutritional management into lifestyle. Needs Review    Patient undertands incorporating physical activity into lifestyle. Demonstrates understanding / competency    Patient understands using medications safely. Demonstrates understanding / competency    Patient understands monitoring blood glucose, interpreting and using results Demonstrates understanding / competency    Patient understands prevention, detection, and treatment of acute complications.  Demonstrates understanding / competency    Patient understands prevention, detection, and treatment of chronic complications. Demonstrates understanding / competency    Patient understands how to develop strategies to address psychosocial issues. Demonstrates understanding / competency    Patient understands how to develop strategies to promote health/change behavior. Needs Review      Outcomes   Program Status Not Completed      Subsequent Visit   Since your last visit have you continued or begun to take your medications as prescribed? Yes    Since your last visit have you experienced any weight changes? No change           Learning Objective:  Patient will have a greater understanding of diabetes self-management. Patient education plan is to attend individual and/or group sessions per assessed needs and concerns.   Plan:   Patient Instructions  Sleep routine.  Consider limiting naps to 20 minutes daily. Exercise motivation - Aim for at least 30 minutes most days  Wake well rested  Have your clothes, shoes laid out  Silver Sneakers/ [employer]  Be sure to eat lunch.  Aim for 45  grams of carbs plus protein.     Expected Outcomes:  Demonstrated interest in learning. Expect positive outcomes  Education material provided:   If problems or questions, patient to contact team via:  Phone  Future DSME appointment: - 3-4 months

## 2020-03-02 ENCOUNTER — Encounter: Payer: Self-pay | Admitting: Endocrinology

## 2020-03-02 ENCOUNTER — Ambulatory Visit: Payer: Medicare Other | Admitting: Endocrinology

## 2020-03-02 ENCOUNTER — Other Ambulatory Visit: Payer: Self-pay

## 2020-03-02 VITALS — HR 82 | Ht 68.0 in | Wt 216.0 lb

## 2020-03-02 DIAGNOSIS — E109 Type 1 diabetes mellitus without complications: Secondary | ICD-10-CM

## 2020-03-02 DIAGNOSIS — N189 Chronic kidney disease, unspecified: Secondary | ICD-10-CM | POA: Diagnosis not present

## 2020-03-02 DIAGNOSIS — E1022 Type 1 diabetes mellitus with diabetic chronic kidney disease: Secondary | ICD-10-CM | POA: Diagnosis not present

## 2020-03-02 LAB — POCT GLYCOSYLATED HEMOGLOBIN (HGB A1C): Hemoglobin A1C: 8 % — AB (ref 4.0–5.6)

## 2020-03-02 MED ORDER — LANTUS SOLOSTAR 100 UNIT/ML ~~LOC~~ SOPN: 55.0000 [IU] | PEN_INJECTOR | SUBCUTANEOUS | 11 refills | Status: DC

## 2020-03-02 NOTE — Progress Notes (Addendum)
Subjective:    Patient ID: Alec Jacobson, male    DOB: 1945/10/21, 74 y.o.   MRN: [id number]  HPI Pt returns for f/u of diabetes mellitus:  DM type: 1  Dx'ed: 2017.  Complications: CRI.   Therapy: insulin since dx, and Trulicity DKA: mild, in 2017.   Severe hypoglycemia: never.   Pancreatitis: never.   Other: he takes QD insulin, after poor results with multiple daily injections; he declines pump rx; fructosamine has confirmed A1c.   Interval history: no cbg record, but states cbg's vary from 135-220.  Pt says he never misses meds.  pt states he feels well in general.  No recent steroids. Pt says he can no longer afford Trulicity.   Past Medical History:  Diagnosis Date  . Cancer [facility])    prostate  . Chronic kidney disease    over 30 yrs ago, had one stone  . Diabetes mellitus without complication ([place])   . H/O allergic rhinitis    job related  . Hypertension     Past Surgical History:  Procedure Laterality Date  . NASAL SINUS SURGERY    . SINUS ENDO WITH FUSION Bilateral 04/13/2014   Procedure: SINUS ENDO WITH FUSION;  Surgeon: Ruby Cola, MD;  Location: [place];  Service: ENT;  Laterality: Bilateral;  . TONSILLECTOMY      Social History   Socioeconomic History  . Marital status: Married    Spouse name: Not on file  . Number of children: Not on file  . Years of education: Not on file  . Highest education level: Not on file  Occupational History  . Not on file  Tobacco Use  . Smoking status: Never Smoker  . Smokeless tobacco: Never Used  Substance and Sexual Activity  . Alcohol use: No  . Drug use: No  . Sexual activity: Not on file  Other Topics Concern  . Not on file  Social History Narrative  . Not on file   Social Determinants of Health   Financial Resource Strain: Not on file  Food Insecurity: Not on file  Transportation Needs: Not on file  Physical Activity: Not on file  Stress: Not on file  Social Connections: Not on file  Intimate Partner  Violence: Not on file    Current Outpatient Medications on File Prior to Visit  Medication Sig Dispense Refill  . amLODipine (NORVASC) 5 MG tablet Take 1 tablet (5 mg total) by mouth daily. 30 tablet 1  . blood glucose meter kit and supplies Dispense based on patient and insurance preference. Use up to four times daily as directed. (FOR ICD-9 250.00, 250.01). 1 each 0  . Insulin Pen Needle 32G X 4 MM MISC 1 each by Other route daily. Use with insulin pens to dispense insulin as directed 90 each 11  . rosuvastatin (CRESTOR) 20 MG tablet Take 20 mg by mouth daily.     No current facility-administered medications on file prior to visit.    No Known Allergies  Family History  Problem Relation Age of Onset  . Aneurysm Father 75  . Diabetes Sister     Pulse 82   Ht $R'5\' 8"'ek$  (1.727 m)   Wt 216 lb (98 kg)   SpO2 96%   BMI 32.84 kg/m    Review of Systems He denies hypoglycemia.      Objective:   Physical Exam VITAL SIGNS:  See vs page GENERAL: no distress Pulses: dorsalis pedis intact bilat.   MSK: no deformity of  the feet CV: no leg edema Skin:  no ulcer on the feet.  normal color and temp on the feet. Neuro: sensation is intact to touch on the feet.     Lab Results  Component Value Date   HGBA1C 8.0 (A) 03/02/2020   Lab Results  Component Value Date   CREATININE 0.98 02/22/2019   BUN 23 02/22/2019   NA 137 02/22/2019   K 4.2 02/22/2019   CL 101 02/22/2019   CO2 29 02/22/2019       Assessment & Plan:  type 1 DM, with CRI: uncontrolled.   Patient Instructions  Please increase the Lantus to 55 units each morning, and stop the Trulicity check your blood sugar twice a day.  vary the time of day when you check, between before the 3 meals, and at bedtime.  also check if you have symptoms of your blood sugar being too high or too low.  please keep a record of the readings and bring it to your next appointment here (or you can bring the meter itself).  You can write it on  any piece of paper.  please call us sooner if your blood sugar goes below 70, or if you have a lot of readings over 200. Please come back for a follow-up appointment in 3 months.

## 2020-03-02 NOTE — Patient Instructions (Addendum)
Please increase the Lantus to 55 units each morning, and stop the Trulicity check your blood sugar twice a day.  vary the time of day when you check, between before the 3 meals, and at bedtime.  also check if you have symptoms of your blood sugar being too high or too low.  please keep a record of the readings and bring it to your next appointment here (or you can bring the meter itself).  You can write it on any piece of paper.  please call us sooner if your blood sugar goes below 70, or if you have a lot of readings over 200. Please come back for a follow-up appointment in 3 months.

## 2020-03-30 ENCOUNTER — Encounter: Payer: Medicare Other | Attending: Endocrinology | Admitting: Dietician

## 2020-03-30 ENCOUNTER — Other Ambulatory Visit: Payer: Self-pay

## 2020-03-30 DIAGNOSIS — E109 Type 1 diabetes mellitus without complications: Secondary | ICD-10-CM | POA: Diagnosis not present

## 2020-03-30 NOTE — Patient Instructions (Addendum)
Great job on starting to walk.  Consider increasing this to 5 days per week. Check your blood glucose consistently.  Blood sugar goal:  80-130 fasting (before breakfast)          Less than 180 - 2 hours after the start of a meal  Continue the [employer]

## 2020-03-30 NOTE — Progress Notes (Signed)
Diabetes Self-Management Education  Visit Type: Follow-up  Appt. Start Time: 1130 Appt. End Time: 1200  03/31/2020  [name], identified by name and date of birth, is a 75 y.o. male with a diagnosis of Diabetes:  .   ASSESSMENT Patient is here today with his wife.  He was last seen by this RD 02/08/2020 Weight 215 lbs 03/30/2020 A1C has decreased to 6.4% 11/25/2019 from 10.7 08/25/2019 but has increased to 8% [id number] off Trulicity. Trulicity is on hold due to patient in the "donut hole" and expense of $600 per month.  They are going to see how his blood sugar responds off of Trulicity currently.  He plans considering restarting this now. He is currently on the [employer] and avoiding all sugar and meat. He has started to walk with his wife 3 days per week and feels better for this but has a hard time getting motivated at times.  Lantus 50 units per day States that he is not checking his blood sugar like he should  History includes Type 2 Diabetes diagnosed in 2017 and HTN. A1C 14.9% both 04/26/2019 and 06/28/2019, Fructosamine 04/26/19 573, GFR 90 02/22/2019.  A1C reduced to 10.7% 08/25/2019.    Medications include 50 units of Lantus q am. He denies forgetting this. We discussed better site rotation and better technique to prevent insulin from leaking back out. Noted issues with a more complex insulin regime in the past. He may resume Trulicity.  Weight  215 lbs 03/30/2020 216 lbs 02/2020 211 lbs 02/08/2020 205 lbs 07/20/2019 198 lbs 03/2019 after a Quillian Quince fast (no sweets, no meat for 21 days). He states that he had increased energy and less bloating.  198-206 lbs recent UBW for the past 4 years.  Patient lives with his wife. She does the shopping and cooking. 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 Weight 215 lb (97.5 kg). Body mass index is 32.69 kg/m.   Diabetes Self-Management Education - 03/31/20 1650      Visit Information   Visit Type Follow-up       Pre-Education Assessment   Patient understands the diabetes disease and treatment process. Demonstrates understanding / competency    Patient understands incorporating nutritional management into lifestyle. Needs Review    Patient undertands incorporating physical activity into lifestyle. Needs Review    Patient understands using medications safely. Needs Review    Patient understands monitoring blood glucose, interpreting and using results Needs Review    Patient understands prevention, detection, and treatment of acute complications. Demonstrates understanding / competency    Patient understands prevention, detection, and treatment of chronic complications. Demonstrates understanding / competency    Patient understands how to develop strategies to address psychosocial issues. Demonstrates understanding / competency    Patient understands how to develop strategies to promote health/change behavior. Needs Review      Complications   Last HgB A1C per patient/outside source 8 %   03/02/2020   How often do you check your blood sugar? 3-4 times / week      Dietary Intake   Breakfast Raisin Bran and plain shredded wheat, 2% milk    Lunch vegetarian pizza    Dinner vegetarain "chicken", brown rice, green beans    Beverage(s) water, tea with splenda      Exercise   Exercise Type Light (walking / raking leaves)    How many days per week to you exercise? 3    How many minutes per day do you exercise?  30    Total minutes per week of exercise 90      Patient Education   Previous Diabetes Education Yes (please comment)   02/08/2020   Nutrition management  Meal options for control of blood glucose level and chronic complications.    Physical activity and exercise  Role of exercise on diabetes management, blood pressure control and cardiac health.    Medications Reviewed patients medication for diabetes, action, purpose, timing of dose and side effects.      Individualized Goals (developed by  patient)   Nutrition General guidelines for healthy choices and portions discussed    Physical Activity Exercise 5-7 days per week;30 minutes per day    Medications take my medication as prescribed    Monitoring  test my blood glucose as discussed    Reducing Risk increase portions of healthy fats      Patient Self-Evaluation of Goals - Patient rates self as meeting previously set goals (% of time)   Nutrition >75%    Physical Activity >75%    Medications >75%    Monitoring 50 - 75 %    Problem Solving >75%    Reducing Risk >75%    Health Coping >75%      Post-Education Assessment   Patient understands the diabetes disease and treatment process. Demonstrates understanding / competency    Patient understands incorporating nutritional management into lifestyle. Needs Review    Patient undertands incorporating physical activity into lifestyle. Needs Review    Patient understands using medications safely. Demonstrates understanding / competency    Patient understands monitoring blood glucose, interpreting and using results Demonstrates understanding / competency    Patient understands prevention, detection, and treatment of acute complications. Demonstrates understanding / competency    Patient understands prevention, detection, and treatment of chronic complications. Demonstrates understanding / competency    Patient understands how to develop strategies to address psychosocial issues. Needs Review    Patient understands how to develop strategies to promote health/change behavior. Needs Review      Outcomes   Expected Outcomes Demonstrated interest in learning. Expect positive outcomes    Future DMSE 2 months    Program Status Not Completed      Subsequent Visit   Since your last visit have you continued or begun to take your medications as prescribed? Yes    Since your last visit have you experienced any weight changes? Gain    Weight Gain (lbs) 3           Individualized Plan  for Diabetes Self-Management Training:   Learning Objective:  Patient will have a greater understanding of diabetes self-management. Patient education plan is to attend individual and/or group sessions per assessed needs and concerns.   Plan:   Patient Instructions  Great job on starting to walk.  Consider increasing this to 5 days per week. Check your blood glucose consistently.  Blood sugar goal:  80-130 fasting (before breakfast)          Less than 180 - 2 hours after the start of a meal  Continue the [employer]   Expected Outcomes:  Demonstrated interest in learning. Expect positive outcomes  Education material provided:   If problems or questions, patient to contact team via:  Phone  Future DSME appointment: 2 months

## 2020-06-09 ENCOUNTER — Other Ambulatory Visit: Payer: Self-pay

## 2020-06-09 ENCOUNTER — Ambulatory Visit: Payer: Medicare Other | Admitting: Endocrinology

## 2020-06-09 VITALS — BP 174/90 | HR 82 | Ht 69.0 in | Wt 218.0 lb

## 2020-06-09 DIAGNOSIS — E109 Type 1 diabetes mellitus without complications: Secondary | ICD-10-CM

## 2020-06-09 LAB — POCT GLYCOSYLATED HEMOGLOBIN (HGB A1C): Hemoglobin A1C: 8.4 % — AB (ref 4.0–5.6)

## 2020-06-09 NOTE — Patient Instructions (Addendum)
Your blood pressure is high today.  Please see your primary care provider soon, to have it rechecked Please increase the Lantus to 65 units each morning.   check your blood sugar twice a day.  vary the time of day when you check, between before the 3 meals, and at bedtime.  also check if you have symptoms of your blood sugar being too high or too low.  please keep a record of the readings and bring it to your next appointment here (or you can bring the meter itself).  You can write it on any piece of paper.  please call us sooner if your blood sugar goes below 70, or if you have a lot of readings over 200. Please come back for a follow-up appointment in 2 months.

## 2020-06-09 NOTE — Progress Notes (Signed)
Subjective:    Patient ID: Alec Jacobson, male    DOB: 1945-10-21, 75 y.o.   MRN: [id number]  HPI Pt returns for f/u of diabetes mellitus:  DM type: 1  Dx'ed: 2017.  Complications: CRI.   Therapy: insulin since dx.   DKA: mild, in 2017.   Severe hypoglycemia: never.   Pancreatitis: never.   SDOH: he could not afford Trulicity Other: he takes QD insulin, after poor results with multiple daily injections; he declines pump rx; fructosamine has confirmed A1c.   Interval history: no cbg record, but states cbg's are in the 200's.  No recent steroids.  Pt says he never misses meds.  pt states he feels well in general.  No recent steroids.  Past Medical History:  Diagnosis Date  . Cancer [facility])    prostate  . Chronic kidney disease    over 30 yrs ago, had one stone  . Diabetes mellitus without complication ([place])   . H/O allergic rhinitis    job related  . Hypertension     Past Surgical History:  Procedure Laterality Date  . NASAL SINUS SURGERY    . SINUS ENDO WITH FUSION Bilateral 04/13/2014   Procedure: SINUS ENDO WITH FUSION;  Surgeon: Ruby Cola, MD;  Location: [facility];  Service: ENT;  Laterality: Bilateral;  . TONSILLECTOMY      Social History   Socioeconomic History  . Marital status: Married    Spouse name: Not on file  . Number of children: Not on file  . Years of education: Not on file  . Highest education level: Not on file  Occupational History  . Not on file  Tobacco Use  . Smoking status: Never Smoker  . Smokeless tobacco: Never Used  Substance and Sexual Activity  . Alcohol use: No  . Drug use: No  . Sexual activity: Not on file  Other Topics Concern  . Not on file  Social History Narrative  . Not on file   Social Determinants of Health   Financial Resource Strain: Not on file  Food Insecurity: Not on file  Transportation Needs: Not on file  Physical Activity: Not on file  Stress: Not on file  Social Connections: Not on file  Intimate Partner  Violence: Not on file    Current Outpatient Medications on File Prior to Visit  Medication Sig Dispense Refill  . amLODipine (NORVASC) 5 MG tablet Take 1 tablet (5 mg total) by mouth daily. 30 tablet 1  . blood glucose meter kit and supplies Dispense based on patient and insurance preference. Use up to four times daily as directed. (FOR ICD-9 250.00, 250.01). 1 each 0  . insulin glargine (LANTUS SOLOSTAR) 100 UNIT/ML Solostar Pen Inject 55 Units into the skin every morning. And pen needles 1/day 60 mL 11  . Insulin Pen Needle 32G X 4 MM MISC 1 each by Other route daily. Use with insulin pens to dispense insulin as directed 90 each 11  . rosuvastatin (CRESTOR) 20 MG tablet Take 20 mg by mouth daily.     No current facility-administered medications on file prior to visit.    No Known Allergies  Family History  Problem Relation Age of Onset  . Aneurysm Father 48  . Diabetes Sister     BP (!) 174/90 (BP Location: Right Arm, Patient Position: Sitting, Cuff Size: Large)   Pulse 82   Ht 5' 9" (1.753 m)   Wt 218 lb (98.9 kg)   SpO2 97%  BMI 32.19 kg/m    Review of Systems He denies hypoglycemia.     Objective:   Physical Exam VITAL SIGNS:  See vs page GENERAL: no distress Pulses: dorsalis pedis intact bilat.   MSK: no deformity of the feet CV: no leg edema Skin:  no ulcer on the feet.  normal color and temp on the feet. Neuro: sensation is intact to touch on the feet  A1c=8.4%     Assessment & Plan:  Type 1 DM, with CRI: uncontrolled HTN: is noted today.   Patient Instructions  Your blood pressure is high today.  Please see your primary care provider soon, to have it rechecked Please increase the Lantus to 65 units each morning.   check your blood sugar twice a day.  vary the time of day when you check, between before the 3 meals, and at bedtime.  also check if you have symptoms of your blood sugar being too high or too low.  please keep a record of the readings and  bring it to your next appointment here (or you can bring the meter itself).  You can write it on any piece of paper.  please call us sooner if your blood sugar goes below 70, or if you have a lot of readings over 200. Please come back for a follow-up appointment in 2 months.

## 2020-06-26 ENCOUNTER — Ambulatory Visit: Payer: Medicare Other | Admitting: Dietician

## 2020-07-20 ENCOUNTER — Other Ambulatory Visit: Payer: Self-pay | Admitting: Endocrinology

## 2020-07-20 DIAGNOSIS — E109 Type 1 diabetes mellitus without complications: Secondary | ICD-10-CM

## 2020-08-11 ENCOUNTER — Other Ambulatory Visit: Payer: Self-pay

## 2020-08-11 ENCOUNTER — Ambulatory Visit (INDEPENDENT_AMBULATORY_CARE_PROVIDER_SITE_OTHER): Payer: Medicare Other | Admitting: Endocrinology

## 2020-08-11 VITALS — BP 184/100 | HR 78 | Ht 69.0 in | Wt 215.8 lb

## 2020-08-11 DIAGNOSIS — E109 Type 1 diabetes mellitus without complications: Secondary | ICD-10-CM | POA: Diagnosis not present

## 2020-08-11 LAB — POCT GLYCOSYLATED HEMOGLOBIN (HGB A1C): Hemoglobin A1C: 7.8 % — AB (ref 4.0–5.6)

## 2020-08-11 MED ORDER — LANTUS SOLOSTAR 100 UNIT/ML ~~LOC~~ SOPN: 58.0000 [IU] | PEN_INJECTOR | SUBCUTANEOUS | 11 refills | Status: DC

## 2020-08-11 NOTE — Patient Instructions (Addendum)
Your blood pressure is high today.  Please see your primary care provider soon, to have it rechecked Please increase the Lantus to 58 units each morning.   check your blood sugar twice a day.  vary the time of day when you check, between before the 3 meals, and at bedtime.  also check if you have symptoms of your blood sugar being too high or too low.  please keep a record of the readings and bring it to your next appointment here (or you can bring the meter itself).  You can write it on any piece of paper.  please call us sooner if your blood sugar goes below 70, or if you have a lot of readings over 200.   Please come back for a follow-up appointment in 3-4 months.

## 2020-08-11 NOTE — Progress Notes (Signed)
Subjective:    Patient ID: Alec Jacobson, male    DOB: 01/17/1946, 75 y.o.   MRN: [id number]  HPI Pt returns for f/u of diabetes mellitus:  DM type: 1  Dx'ed: 2017.  Complications: CRI.   Therapy: insulin since dx.   DKA: mild, in 2017.   Severe hypoglycemia: never.   Pancreatitis: never.   SDOH: he could not afford Trulicity Other: he takes QD insulin, after poor results with multiple daily injections; he declines pump rx; fructosamine has confirmed A1c.   Interval history: no cbg record, but states cbg's vary from 175-220. Pt says he never misses insulin.  pt states he feels well in general.  No recent steroids.  Exercise is much better recently.  He takes 55 units qam. Past Medical History:  Diagnosis Date  . Cancer [facility])    prostate  . Chronic kidney disease    over 30 yrs ago, had one stone  . Diabetes mellitus without complication ([place])   . H/O allergic rhinitis    job related  . Hypertension     Past Surgical History:  Procedure Laterality Date  . NASAL SINUS SURGERY    . SINUS ENDO WITH FUSION Bilateral 04/13/2014   Procedure: SINUS ENDO WITH FUSION;  Surgeon: Ruby Cola, MD;  Location: [facility];  Service: ENT;  Laterality: Bilateral;  . TONSILLECTOMY      Social History   Socioeconomic History  . Marital status: Married    Spouse name: Not on file  . Number of children: Not on file  . Years of education: Not on file  . Highest education level: Not on file  Occupational History  . Not on file  Tobacco Use  . Smoking status: Never Smoker  . Smokeless tobacco: Never Used  Substance and Sexual Activity  . Alcohol use: No  . Drug use: No  . Sexual activity: Not on file  Other Topics Concern  . Not on file  Social History Narrative  . Not on file   Social Determinants of Health   Financial Resource Strain: Not on file  Food Insecurity: Not on file  Transportation Needs: Not on file  Physical Activity: Not on file  Stress: Not on file  Social  Connections: Not on file  Intimate Partner Violence: Not on file    Current Outpatient Medications on File Prior to Visit  Medication Sig Dispense Refill  . amLODipine (NORVASC) 5 MG tablet Take 1 tablet (5 mg total) by mouth daily. 30 tablet 1  . BD PEN NEEDLE NANO U/F 32G X 4 MM MISC USE WITH INSULIN PENS TO  DISPENSE INSULIN DAILY AS  DIRECTED 90 each 11  . blood glucose meter kit and supplies Dispense based on patient and insurance preference. Use up to four times daily as directed. (FOR ICD-9 250.00, 250.01). 1 each 0  . rosuvastatin (CRESTOR) 20 MG tablet Take 20 mg by mouth daily.     No current facility-administered medications on file prior to visit.    No Known Allergies  Family History  Problem Relation Age of Onset  . Aneurysm Father 47  . Diabetes Sister     BP (!) 184/100 (BP Location: Right Arm, Patient Position: Sitting, Cuff Size: Large)   Pulse 78   Ht 5' 9" (1.753 m)   Wt 215 lb 12.8 oz (97.9 kg)   SpO2 96%   BMI 31.87 kg/m    Review of Systems     Objective:   Physical Exam VITAL  SIGNS:  See vs page GENERAL: no distress Pulses: dorsalis pedis intact bilat.   MSK: no deformity of the feet CV: no leg edema Skin:  no ulcer on the feet.  normal color and temp on the feet. Neuro: sensation is intact to touch on the feet.    A1c=7.8%    Assessment & Plan:  Type 1 DM: uncontrolled.   Patient Instructions  Your blood pressure is high today.  Please see your primary care provider soon, to have it rechecked Please increase the Lantus to 58 units each morning.   check your blood sugar twice a day.  vary the time of day when you check, between before the 3 meals, and at bedtime.  also check if you have symptoms of your blood sugar being too high or too low.  please keep a record of the readings and bring it to your next appointment here (or you can bring the meter itself).  You can write it on any piece of paper.  please call us sooner if your blood sugar  goes below 70, or if you have a lot of readings over 200.   Please come back for a follow-up appointment in 3-4 months.

## 2020-12-15 ENCOUNTER — Other Ambulatory Visit: Payer: Self-pay

## 2020-12-15 ENCOUNTER — Ambulatory Visit: Payer: Medicare Other | Admitting: Endocrinology

## 2020-12-15 VITALS — BP 184/84 | HR 76 | Ht 69.0 in | Wt 213.0 lb

## 2020-12-15 DIAGNOSIS — E109 Type 1 diabetes mellitus without complications: Secondary | ICD-10-CM

## 2020-12-15 LAB — POCT GLYCOSYLATED HEMOGLOBIN (HGB A1C): Hemoglobin A1C: 7.3 % — AB (ref 4.0–5.6)

## 2020-12-15 MED ORDER — LANTUS SOLOSTAR 100 UNIT/ML ~~LOC~~ SOPN: 55.0000 [IU] | PEN_INJECTOR | SUBCUTANEOUS | 11 refills | Status: DC

## 2020-12-15 NOTE — Patient Instructions (Addendum)
Your blood pressure is high today.  Please see your primary care provider soon, to have it rechecked Please continue the same Lantus.   check your blood sugar twice a day.  vary the time of day when you check, between before the 3 meals, and at bedtime.  also check if you have symptoms of your blood sugar being too high or too low.  please keep a record of the readings and bring it to your next appointment here (or you can bring the meter itself).  You can write it on any piece of paper.  please call us sooner if your blood sugar goes below 70, or if you have a lot of readings over 200.   Please come back for a follow-up appointment in 4 months.

## 2020-12-15 NOTE — Progress Notes (Signed)
Subjective:    Patient ID: Alec Jacobson, male    DOB: 30-Mar-1945, 75 y.o.   MRN: [id number]  HPI Pt returns for f/u of diabetes mellitus:  DM type: 1  Dx'ed: 2017.  Complications: CRI.   Therapy: insulin since dx.   DKA: mild, in 2017.   Severe hypoglycemia: never.   Pancreatitis: never.   SDOH: he could not afford Trulicity.   Other: he takes QD insulin, after poor results with multiple daily injections; he declines pump rx; fructosamine has confirmed A1c.   Interval history: no cbg record, but states cbg's vary from 180-225.  Pt says he never misses insulin.  pt states he feels well in general, except for fatigue.  No recent steroids.  He takes 55 units qam.    Past Medical History:  Diagnosis Date   Cancer ([place])    prostate   Chronic kidney disease    over 30 yrs ago, had one stone   Diabetes mellitus without complication ([place])    H/O allergic rhinitis    job related   Hypertension     Past Surgical History:  Procedure Laterality Date   NASAL SINUS SURGERY     SINUS ENDO WITH FUSION Bilateral 04/13/2014   Procedure: SINUS ENDO WITH FUSION;  Surgeon: Ruby Cola, MD;  Location[facility] OR;  Service: ENT;  Laterality: Bilateral;   TONSILLECTOMY      Social History   Socioeconomic History   Marital status: Married    Spouse name: Not on file   Number of children: Not on file   Years of education: Not on file   Highest education level: Not on file  Occupational History   Not on file  Tobacco Use   Smoking status: Never   Smokeless tobacco: Never  Substance and Sexual Activity   Alcohol use: No   Drug use: No   Sexual activity: Not on file  Other Topics Concern   Not on file  Social History Narrative   Not on file   Social Determinants of Health   Financial Resource Strain: Not on file  Food Insecurity: Not on file  Transportation Needs: Not on file  Physical Activity: Not on file  Stress: Not on file  Social Connections: Not on file  Intimate Partner  Violence: Not on file    Current Outpatient Medications on File Prior to Visit  Medication Sig Dispense Refill   amLODipine (NORVASC) 5 MG tablet Take 1 tablet (5 mg total) by mouth daily. 30 tablet 1   BD PEN NEEDLE NANO U/F 32G X 4 MM MISC USE WITH INSULIN PENS TO  DISPENSE INSULIN DAILY AS  DIRECTED 90 each 11   blood glucose meter kit and supplies Dispense based on patient and insurance preference. Use up to four times daily as directed. (FOR ICD-9 250.00, 250.01). 1 each 0   rosuvastatin (CRESTOR) 20 MG tablet Take 20 mg by mouth daily.     No current facility-administered medications on file prior to visit.    No Known Allergies  Family History  Problem Relation Age of Onset   Aneurysm Father 38   Diabetes Sister     BP (!) 184/84 (BP Location: Right Arm, Patient Position: Sitting, Cuff Size: Normal)   Pulse 76   Ht _0  (1.753 m)   Wt 213 lb (96.6 kg)   SpO2 97%   BMI 31.45 kg/m    Review of Systems He denies hypoglycemia.      Objective:  Physical Exam Pulses: dorsalis pedis intact bilat.   MSK: no deformity of the feet CV: no leg edema.  Skin:  no ulcer on the feet.  normal color and temp on the feet.   Neuro: sensation is intact to touch on the feet.    Lab Results  Component Value Date   CREATININE 0.98 02/22/2019   BUN 23 02/22/2019   NA 137 02/22/2019   K 4.2 02/22/2019   CL 101 02/22/2019   CO2 29 02/22/2019   Lab Results  Component Value Date   HGBA1C 7.3 (A) 12/15/2020      Assessment & Plan:  Type 1 DM:  this is the best control this pt should aim for, given this regimen, which does match insulin to his changing needs throughout the day  Patient Instructions  Your blood pressure is high today.  Please see your primary care provider soon, to have it rechecked Please continue the same Lantus.   check your blood sugar twice a day.  vary the time of day when you check, between before the 3 meals, and at bedtime.  also check if you have  symptoms of your blood sugar being too high or too low.  please keep a record of the readings and bring it to your next appointment here (or you can bring the meter itself).  You can write it on any piece of paper.  please call us sooner if your blood sugar goes below 70, or if you have a lot of readings over 200.   Please come back for a follow-up appointment in 4 months.

## 2021-01-09 NOTE — Progress Notes (Signed)
Cardiology Office Note:    Date:  01/10/2021   ID:  Alec Jacobson, DOB [birth year], MRN [id number]  PCP:  Alec Liberty, MD   [facility] Providers Cardiologist:  Alec Lean, MD     Referring MD: Alec Liberty, MD   CC: Heart Pain Consulted for the evaluation of chest pain at the behest of Alec Liberty, MD  History of Present Illness:    Alec Jacobson is a 75 y.o. male with a hx of HTN with DM, who presents 01/10/21.  Patient notes that he is feeling pain at his heart.  Was told this was probably his muscles but given his history was sent for evaluation. Notes that his hear pain comes and goes.  Last was about three weeks ago when setting down.  No triggers or relieving factors.  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  Notes no pain during the but feels it a couple of days later.  Notes that he is a bit nervous about being here, but his blood pressure has been running high; he has started on a new blood pressure medication.   Past Medical History:  Diagnosis Date   Cancer [facility])    prostate   Chronic kidney disease    over 30 yrs ago, had one stone   Diabetes mellitus without complication (Alec Jacobson)    H/O allergic rhinitis    job related   Hypertension     Past Surgical History:  Procedure Laterality Date   NASAL SINUS SURGERY     SINUS ENDO WITH FUSION Bilateral 04/13/2014   Procedure: SINUS ENDO WITH FUSION;  Surgeon: Alec Cola, MD;  Location: [facility] OR;  Service: ENT;  Laterality: Bilateral;   TONSILLECTOMY      Current Medications: Current Meds  Medication Sig   amLODipine (NORVASC) 5 MG tablet Take 1 tablet (5 mg total) by mouth daily.   aspirin EC 81 MG tablet Take 81 mg by mouth daily. Swallow whole.   BD PEN NEEDLE NANO U/F 32G X 4 MM MISC USE WITH INSULIN PENS TO   DISPENSE INSULIN DAILY AS  DIRECTED   blood glucose meter kit and supplies Dispense based on patient and insurance preference. Use up to four times daily as directed. (FOR ICD-9 250.00, 250.01).   hydrochlorothiazide (HYDRODIURIL) 25 MG tablet Take 25 mg by mouth daily.   insulin glargine (LANTUS SOLOSTAR) 100 UNIT/ML Solostar Pen Inject 55 Units into the skin every morning. And pen needles 1/day   metoprolol tartrate (LOPRESSOR) 100 MG tablet Take 2 hours prior to Cardiac CT   rosuvastatin (CRESTOR) 20 MG tablet Take 20 mg by mouth daily.     Allergies:   Patient has no known allergies.   Social History   Socioeconomic History   Marital status: Married    Spouse name: Not on file   Number of children: Not on file   Years of education: Not on file   Highest education level: Not on file  Occupational History   Not on file  Tobacco Use   Smoking status: Never   Smokeless tobacco: Never  Substance and Sexual Activity   Alcohol use: No   Drug use: No   Sexual activity: Not on file  Other Topics Concern   Not on file  Social History Narrative   Not  on file   Social Determinants of Health   Financial Resource Strain: Not on file  Food Insecurity: Not on file  Transportation Needs: Not on file  Physical Activity: Not on file  Stress: Not on file  Social Connections: Not on file    Social: active in his church and comes with his wife  Family History: The patient's family history includes Aneurysm (age of onset: 37) in his father; Diabetes in his sister.  Brain Aneurysm. No heart problems.  ROS:   Please see the history of present illness.     All other systems reviewed and are negative.  EKGs/Labs/Other Studies Reviewed:    The following studies were reviewed today:  EKG:  EKG is  ordered today.  The ekg ordered today demonstrates  01/10/21: SR rate 82 inferior infarct  Recent Labs: No results found for requested labs within last 8760 hours.  Recent Lipid Panel No  results found for: CHOL, TRIG, HDL, CHOLHDL, VLDL, LDLCALC, LDLDIRECT   Physical Exam:    VS:  BP (!) 150/84   Pulse 82   Ht $R'5\' 9"'KY$  (1.753 m)   Wt 210 lb (95.3 kg)   SpO2 98%   BMI 31.01 kg/m     Wt Readings from Last 3 Encounters:  01/10/21 210 lb (95.3 kg)  12/15/20 213 lb (96.6 kg)  08/11/20 215 lb 12.8 oz (97.9 kg)     GEN:  Well nourished, well developed in no acute distress HEENT: Normal NECK: No JVD LYMPHATICS: No lymphadenopathy CARDIAC: RRR, no murmurs, rubs, gallops RESPIRATORY:  Clear to auscultation without rales, wheezing or rhonchi  ABDOMEN: Soft, non-tender, non-distended MUSCULOSKELETAL:  No edema; No deformity  SKIN: Warm and dry NEUROLOGIC:  Alert and oriented x 3 PSYCHIATRIC:  Normal affect   ASSESSMENT:    1. Precordial pain   2. Hypertension associated with diabetes ([place])   3. History of diabetic ketoacidosis   4. Hyperlipidemia, unspecified hyperlipidemia type    PLAN:    Precordial chest pain HTN with DM - The patient presents with possibly cardiac pain - has inferior infarct pattern on ECG - LDL 75 - Continue ASA 81 mg QD, current rosuvastatin 20 mg PO daily  - ambulatory BP checks -  on HCTZ 25 mg PO daily, and norvasc 5 mg  - Would recommend CCTA with possible FFR as needed to exclude obstructive CAD and to assess for non-obstructive CAD requiring secondary prevention - resting heart rate is 82 bpm - add Metoprolol  100 mg PO 90 min prior to scan - Creatinine in 04/20/20 was 1.070, will get BMP  Will plan for 3-4 months follow up unless new symptoms or abnormal test results warranting change in plan  Would be reasonable for  APP Follow up   Medication Adjustments/Labs and Tests Ordered: Current medicines are reviewed at length with the patient today.  Concerns regarding medicines are outlined above.  Orders Placed This Encounter  Procedures   CT CORONARY MORPH W/CTA COR W/SCORE W/CA W/CM &/OR WO/CM   Basic metabolic panel   EKG  65-KCLE    Meds ordered this encounter  Medications   metoprolol tartrate (LOPRESSOR) 100 MG tablet    Sig: Take 2 hours prior to Cardiac CT    Dispense:  1 tablet    Refill:  0     Patient Instructions  Medication Instructions:  Your physician recommends that you continue on your current medications as directed. Please refer to the Current Medication list given to you today.  *  If you need a refill on your cardiac medications before your next appointment, please call your pharmacy*   Lab Work: TODAY: BMP If you have labs (blood work) drawn today and your tests are completely normal, you will receive your results only by: [place] (if you have MyChart) OR A paper copy in the mail If you have any lab test that is abnormal or we need to change your treatment, we will call you to review the results.   Testing/Procedures: Your physician has requested that you have cardiac CT. Cardiac computed tomography (CT) is a painless test that uses an x-ray machine to take clear, detailed pictures of your heart. Please follow instruction sheet as given.     Follow-Up: At [facility], you and your health needs are our priority.  As part of our continuing mission to provide you with exceptional heart care, we have created designated Provider Care Teams.  These Care Teams include your primary Cardiologist (physician) and Advanced Practice Providers (APPs -  Physician Assistants and Nurse Practitioners) who all work together to provide you with the care you need, when you need it.  We recommend signing up for the patient portal called "MyChart".  Sign up information is provided on this After Visit Summary.  MyChart is used to connect with patients for Virtual Visits (Telemedicine).  Patients are able to view lab/test results, encounter notes, upcoming appointments, etc.  Non-urgent messages can be sent to your provider as well.   To learn more about what you can do with MyChart, go to  [email or web address].    Your next appointment:   3 month(s)  The format for your next appointment:   In Person  Provider:   You may see Alec Lean, MD or one of the following Advanced Practice Providers on your designated Care Team:   Melina Copa, PA-C Ermalinda Barrios, PA-C   Other Instructions   Your cardiac CT will be scheduled at one of the below locations:   [facility] [address] [place],  [address] [phone]   At [facility], please arrive at the [facility] main entrance (entrance A) of [facility] 30 minutes prior to test start time. You can use the FREE valet parking offered at the main entrance (encouraged to control the heart rate for the test) Proceed to the [facility] Radiology Department (first floor) to check-in and test prep.   Please follow these instructions carefully (unless otherwise directed):  Hold all erectile dysfunction medications at least 3 days (72 hrs) prior to test.  On the Night Before the Test: Be sure to Drink plenty of water. Do not consume any caffeinated/decaffeinated beverages or chocolate 12 hours prior to your test. Do not take any antihistamines 12 hours prior to your test.   On the Day of the Test: Drink plenty of water until 1 hour prior to the test. Do not eat any food 4 hours prior to the test. You may take your regular medications prior to the test.  Take metoprolol (Lopressor) 100 mg two hours prior to test. HOLD Hydrochlorothiazide morning of the test.       After the Test: Drink plenty of water. After receiving IV contrast, you may experience a mild flushed feeling. This is normal. On occasion, you may experience a mild rash up to 24 hours after the test. This is not dangerous. If this occurs, you can take Benadryl 25 mg and increase your fluid intake. If you experience trouble  breathing, this can be serious. If it is severe call 911 IMMEDIATELY. If it is mild,  please call our office. If you take any of these medications: Glipizide/Metformin, Avandament, Glucavance, please do not take 48 hours after completing test unless otherwise instructed.  Please allow 2-4 weeks for scheduling of routine cardiac CTs. Some insurance companies require a pre-authorization which may delay scheduling of this test.   For non-scheduling related questions, please contact the cardiac imaging nurse navigator should you have any questions/concerns: Marchia Bond, Cardiac Imaging Nurse Navigator Gordy Clement, Cardiac Imaging Nurse Navigator  Heart and Vascular Services Direct Office Dial: [phone]   For scheduling needs, including cancellations and rescheduling, please call [place], [phone].    Signed, Alec Lean, MD  01/10/2021 2:08 PM    [facility]

## 2021-01-10 ENCOUNTER — Encounter: Payer: Self-pay | Admitting: Internal Medicine

## 2021-01-10 ENCOUNTER — Other Ambulatory Visit: Payer: Self-pay

## 2021-01-10 ENCOUNTER — Ambulatory Visit: Payer: Medicare Other | Admitting: Internal Medicine

## 2021-01-10 VITALS — BP 150/84 | HR 82 | Ht 69.0 in | Wt 210.0 lb

## 2021-01-10 DIAGNOSIS — R072 Precordial pain: Secondary | ICD-10-CM | POA: Diagnosis not present

## 2021-01-10 DIAGNOSIS — E785 Hyperlipidemia, unspecified: Secondary | ICD-10-CM | POA: Diagnosis not present

## 2021-01-10 DIAGNOSIS — E1159 Type 2 diabetes mellitus with other circulatory complications: Secondary | ICD-10-CM

## 2021-01-10 DIAGNOSIS — Z8639 Personal history of other endocrine, nutritional and metabolic disease: Secondary | ICD-10-CM | POA: Diagnosis not present

## 2021-01-10 DIAGNOSIS — I152 Hypertension secondary to endocrine disorders: Secondary | ICD-10-CM

## 2021-01-10 MED ORDER — METOPROLOL TARTRATE 100 MG PO TABS: ORAL_TABLET | ORAL | 0 refills | Status: DC

## 2021-01-10 NOTE — Patient Instructions (Addendum)
Medication Instructions:  Your physician recommends that you continue on your current medications as directed. Please refer to the Current Medication list given to you today.  *If you need a refill on your cardiac medications before your next appointment, please call your pharmacy*   Lab Work: TODAY: BMP If you have labs (blood work) drawn today and your tests are completely normal, you will receive your results only by: [place] (if you have MyChart) OR A paper copy in the mail If you have any lab test that is abnormal or we need to change your treatment, we will call you to review the results.   Testing/Procedures: Your physician has requested that you have cardiac CT. Cardiac computed tomography (CT) is a painless test that uses an x-ray machine to take clear, detailed pictures of your heart. Please follow instruction sheet as given.     Follow-Up: At [facility], you and your health needs are our priority.  As part of our continuing mission to provide you with exceptional heart care, we have created designated Provider Care Teams.  These Care Teams include your primary Cardiologist (physician) and Advanced Practice Providers (APPs -  Physician Assistants and Nurse Practitioners) who all work together to provide you with the care you need, when you need it.  We recommend signing up for the patient portal called "MyChart".  Sign up information is provided on this After Visit Summary.  MyChart is used to connect with patients for Virtual Visits (Telemedicine).  Patients are able to view lab/test results, encounter notes, upcoming appointments, etc.  Non-urgent messages can be sent to your provider as well.   To learn more about what you can do with MyChart, go to [email or web address].    Your next appointment:   3 month(s)  The format for your next appointment:   In Person  Provider:   You may see Werner Lean, MD or one of the following Advanced Practice  Providers on your designated Care Team:   Melina Copa, PA-C Ermalinda Barrios, PA-C   Other Instructions   Your cardiac CT will be scheduled at one of the below locations:   [facility] [address] [place], [place] [address] [phone]   At [facility], please arrive at the [facility] main entrance (entrance A) of [facility] 30 minutes prior to test start time. You can use the FREE valet parking offered at the main entrance (encouraged to control the heart rate for the test) Proceed to the [facility] Radiology Department (first floor) to check-in and test prep.   Please follow these instructions carefully (unless otherwise directed):  Hold all erectile dysfunction medications at least 3 days (72 hrs) prior to test.  On the Night Before the Test: Be sure to Drink plenty of water. Do not consume any caffeinated/decaffeinated beverages or chocolate 12 hours prior to your test. Do not take any antihistamines 12 hours prior to your test.   On the Day of the Test: Drink plenty of water until 1 hour prior to the test. Do not eat any food 4 hours prior to the test. You may take your regular medications prior to the test.  Take metoprolol (Lopressor) 100 mg two hours prior to test. HOLD Hydrochlorothiazide morning of the test.       After the Test: Drink plenty of water. After receiving IV contrast, you may experience a mild flushed feeling. This is normal. On occasion, you may experience a mild rash up to  24 hours after the test. This is not dangerous. If this occurs, you can take Benadryl 25 mg and increase your fluid intake. If you experience trouble breathing, this can be serious. If it is severe call 911 IMMEDIATELY. If it is mild, please call our office. If you take any of these medications: Glipizide/Metformin, Avandament, Glucavance, please do not take 48 hours after completing test unless otherwise instructed.  Please allow 2-4 weeks for  scheduling of routine cardiac CTs. Some insurance companies require a pre-authorization which may delay scheduling of this test.   For non-scheduling related questions, please contact the cardiac imaging nurse navigator should you have any questions/concerns: Marchia Bond, Cardiac Imaging Nurse Navigator Gordy Clement, Cardiac Imaging Nurse Navigator [place] [facility] Direct Office Dial: [phone]   For scheduling needs, including cancellations and rescheduling, please call [place], [phone].

## 2021-01-11 LAB — BASIC METABOLIC PANEL WITH GFR
BUN/Creatinine Ratio: 15 (ref 10–24)
BUN: 19 mg/dL (ref 8–27)
CO2: 27 mmol/L (ref 20–29)
Calcium: 9.5 mg/dL (ref 8.6–10.2)
Chloride: 102 mmol/L (ref 96–106)
Creatinine, Ser: 1.23 mg/dL (ref 0.76–1.27)
Glucose: 187 mg/dL — ABNORMAL HIGH (ref 70–99)
Potassium: 3.7 mmol/L (ref 3.5–5.2)
Sodium: 142 mmol/L (ref 134–144)
eGFR: 62 mL/min/1.73

## 2021-01-18 ENCOUNTER — Telehealth (HOSPITAL_COMMUNITY): Payer: Self-pay | Admitting: *Deleted

## 2021-01-18 NOTE — Telephone Encounter (Signed)
Reaching out to patient to offer assistance regarding upcoming cardiac imaging study; pt verbalizes understanding of appt date/time, parking situation and where to check in, pre-test NPO status and medications ordered, and verified current allergies; name and call back number provided for further questions should they arise ° °Avry Monteleone RN Navigator Cardiac Imaging °[facility] °[phone] office °[phone] cell  ° °Patient to take 100mg metoprolol tartrate two hours prior to cardiac CT scan. °

## 2021-01-19 ENCOUNTER — Ambulatory Visit (HOSPITAL_COMMUNITY)
Admission: RE | Admit: 2021-01-19 | Discharge: 2021-01-19 | Disposition: A | Discharge status: Home / Self Care | Payer: Medicare Other | Source: Ambulatory Visit | Attending: Cardiology | Admitting: Cardiology

## 2021-01-19 ENCOUNTER — Ambulatory Visit (HOSPITAL_COMMUNITY)
Admission: RE | Admit: 2021-01-19 | Discharge: 2021-01-19 | Disposition: A | Discharge status: Home / Self Care | Payer: Medicare Other | Source: Ambulatory Visit | Attending: Internal Medicine | Admitting: Internal Medicine

## 2021-01-19 ENCOUNTER — Other Ambulatory Visit: Payer: Self-pay | Admitting: Cardiology

## 2021-01-19 ENCOUNTER — Other Ambulatory Visit: Payer: Self-pay

## 2021-01-19 ENCOUNTER — Other Ambulatory Visit (HOSPITAL_COMMUNITY): Payer: Self-pay | Admitting: Emergency Medicine

## 2021-01-19 DIAGNOSIS — R931 Abnormal findings on diagnostic imaging of heart and coronary circulation: Secondary | ICD-10-CM | POA: Diagnosis present

## 2021-01-19 DIAGNOSIS — I700 Atherosclerosis of aorta: Secondary | ICD-10-CM | POA: Diagnosis not present

## 2021-01-19 DIAGNOSIS — Z789 Other specified health status: Secondary | ICD-10-CM | POA: Diagnosis present

## 2021-01-19 DIAGNOSIS — R072 Precordial pain: Secondary | ICD-10-CM | POA: Diagnosis present

## 2021-01-19 DIAGNOSIS — R0789 Other chest pain: Secondary | ICD-10-CM

## 2021-01-19 DIAGNOSIS — R079 Chest pain, unspecified: Secondary | ICD-10-CM

## 2021-01-19 IMAGING — CT CT HEART MORP W/ CTA COR W/ SCORE W/ CA W/CM &/OR W/O CM
1 series · 1 of 2 positions shown · non-contrast
Comparison: None.
COMPARISON: None.

Addendum:
EXAM:
OVER-READ INTERPRETATION  CT CHEST

The following report is an over-read performed by radiologist Dr.
Paulus N Ceejay [REDACTED] on 01/19/2021. This
over-read does not include interpretation of cardiac or coronary
anatomy or pathology. The coronary calcium score/coronary CTA
interpretation by the cardiologist is attached.
CLINICAL DATA: 74 year old with chest pain
Cardiac/Coronary  CTA
TECHNIQUE: The patient was scanned on a Phillips Force scanner.

[Series 3262: — · 0.40mm/px · 1 of 2 slices shown]
[im 2/2]
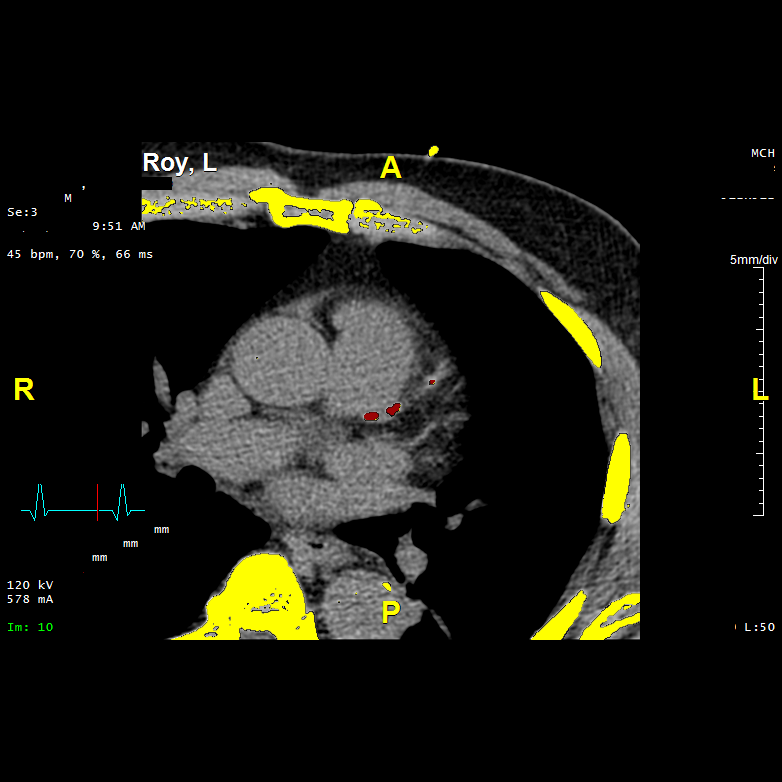

[1 of 2 positions shown; findings below may reference images not displayed]

FINDINGS: Atherosclerotic calcifications in the thoracic aorta. Within the
visualized portions of the thorax there are no suspicious appearing
pulmonary nodules or masses, there is no acute consolidative
airspace disease, no pleural effusions, no pneumothorax and no
lymphadenopathy. Visualized portions of the upper abdomen straight
demonstrates several small low-attenuation lesions in the visualized
hepatic parenchyma, incompletely characterized on today's
examination, but statistically likely to represent tiny cysts. There
are no aggressive appearing lytic or blastic lesions noted in the
visualized portions of the skeleton.
IMPRESSION: 1.  Aortic Atherosclerosis (76Q1A-M3O.O).
FINDINGS: A 100 kV prospective scan was triggered in the descending thoracic
aorta at 111 HU's. Axial non-contrast 3 mm slices were carried out
through the heart. The data set was analyzed on a dedicated work
station and scored using the Agatson method. Gantry rotation speed
was 250 msecs and collimation was .6 mm. No beta blockade and 0.8 mg
of sl NTG was given. The 3D data set was reconstructed in 5%
intervals of the 67-82 % of the R-R cycle. Diastolic phases were
analyzed on a dedicated work station using MPR, MIP and VRT modes.
The patient received 80 cc of contrast.

Aorta:  Normal size.  Aortic atherosclerosis.  No dissection.

Aortic Valve:  Trileaflet.  No calcifications.

Coronary Arteries:  Normal coronary origin.  Right dominance.

RCA is a large dominant artery that gives rise to PDA and PLA. There
is non calcified proximal stenosis of 50-69%. FFR is normal 0.84.

Left main is a large, short artery that gives rise to LAD and LCX
arteries.

LAD is a large vessel that has proximal and mid calcified plaque.
Proximal 0-24% stenosis. Mid just beyond first diagonal, mixed
plaque with 25-49% stenosis. FFR is normal 0.82 just distal to
lesion. As LAD continues FFR decreases gradually to 0.70.

LCX is a non-dominant artery that gives rise to one large OM1
branch. There is no plaque. OM 2 is a small caliber vessel with mid
75-99% stenosis. FFR too small caliber to model.

Other findings:

Normal pulmonary vein drainage into the left atrium.

Normal left atrial appendage without a thrombus.

Normal size of the pulmonary artery.

Please see radiology report for non cardiac findings.
IMPRESSION: 1. Coronary calcium score of 201 (LAD). This was 69 percentile for
age and sex matched control.

2. Normal coronary origin with right dominance.

3. Moderate RCA proximal stenosis, mild to moderate mid LAD stenosis
- FFR is normal. Small caliber OM2 stenosis 75-99% stenosis mid
vessel, too small for PCI.

4.  Aortic atherosclerosis.

*** End of Addendum ***
EXAM:
OVER-READ INTERPRETATION  CT CHEST

The following report is an over-read performed by radiologist Dr.
Paulus N Ceejay [REDACTED] on 01/19/2021. This
over-read does not include interpretation of cardiac or coronary
anatomy or pathology. The coronary calcium score/coronary CTA
interpretation by the cardiologist is attached.
FINDINGS: Atherosclerotic calcifications in the thoracic aorta. Within the
visualized portions of the thorax there are no suspicious appearing
pulmonary nodules or masses, there is no acute consolidative
airspace disease, no pleural effusions, no pneumothorax and no
lymphadenopathy. Visualized portions of the upper abdomen straight
demonstrates several small low-attenuation lesions in the visualized
hepatic parenchyma, incompletely characterized on today's
examination, but statistically likely to represent tiny cysts. There
are no aggressive appearing lytic or blastic lesions noted in the
visualized portions of the skeleton.
IMPRESSION: 1.  Aortic Atherosclerosis (76Q1A-M3O.O).

## 2021-01-19 MED ORDER — NITROGLYCERIN 0.4 MG SL SUBL
SUBLINGUAL_TABLET | SUBLINGUAL | Status: AC
  Filled 2021-01-19: qty 2

## 2021-01-19 MED ORDER — IOHEXOL 350 MG/ML SOLN
100.0000 mL | Freq: Once | INTRAVENOUS | Status: AC | PRN
  Administered 2021-01-19: 100 mL via INTRAVENOUS

## 2021-01-19 MED ORDER — NITROGLYCERIN 0.4 MG SL SUBL
0.8000 mg | SUBLINGUAL_TABLET | Freq: Once | SUBLINGUAL | Status: AC
  Administered 2021-01-19: 0.8 mg via SUBLINGUAL

## 2021-01-22 ENCOUNTER — Telehealth: Payer: Self-pay | Admitting: *Deleted

## 2021-01-22 MED ORDER — METOPROLOL SUCCINATE ER 25 MG PO TB24: 25.0000 mg | ORAL_TABLET | Freq: Every day | ORAL | 3 refills | Status: DC

## 2021-01-22 MED ORDER — NITROGLYCERIN 0.4 MG SL SUBL: 0.4000 mg | SUBLINGUAL_TABLET | SUBLINGUAL | 3 refills | Status: AC | PRN

## 2021-01-22 NOTE — Telephone Encounter (Signed)
The patient has been notified of the result and verbalized understanding.  All questions (if any) were answered. Darrell Jewel, RN 01/22/2021 9:22 AM   Ordered Metoprolol succinate 25 mg PO QD PRN Nitro  Moved OV

## 2021-01-22 NOTE — Telephone Encounter (Signed)
-----   Message from Werner Lean, MD sent at 01/20/2021  2:44 PM EDT ----- Results: Moderate coronary artery disease Plan: Starting metoprolol succinate 25 mg PO daily  PRN Nitro Move up vist from Jan to Dec  Werner Lean, MD

## 2021-02-26 NOTE — Progress Notes (Signed)
Cardiology Office Note:    Date:  02/28/2021   ID:  Alec Jacobson, DOB 17-Dec-1945, MRN [id number]  PCP:  Vincente Liberty, MD   [facility] Providers Cardiologist:  Werner Lean, MD     Referring MD: Vincente Liberty, MD   CC: Heart Pain Consulted for the evaluation of chest pain at the behest of Vincente Liberty, MD  History of Present Illness:    Alec Jacobson is a 75 y.o. male with a hx of HTN with DM, who presented 01/10/21. In interim of this visit, patient had Cardiac CT.  He has a moderate RCA lesion that was FFR negative.  Started on BB and PRN nitro. Seen 02/28/21.  Patient notes that he is doing well.   Since day prior/last visit notes had CCTA with moderate disease . There are no interval hospital/ED visit.    No chest pain or pressure .  No SOB/DOE and no PND/Orthopnea.  No weight gain or leg swelling.  No palpitations or syncope.  Ambulatory blood pressure not done.   Past Medical History:  Diagnosis Date   Cancer [facility])    prostate   Chronic kidney disease    over 30 yrs ago, had one stone   Diabetes mellitus without complication ([place])    H/O allergic rhinitis    job related   Hypertension     Past Surgical History:  Procedure Laterality Date   NASAL SINUS SURGERY     SINUS ENDO WITH FUSION Bilateral 04/13/2014   Procedure: SINUS ENDO WITH FUSION;  Surgeon: Ruby Cola, MD;  Location: [facility] OR;  Service: ENT;  Laterality: Bilateral;   TONSILLECTOMY      Current Medications: Current Meds  Medication Sig   amLODipine (NORVASC) 5 MG tablet Take 1 tablet (5 mg total) by mouth daily.   aspirin EC 81 MG tablet Take 81 mg by mouth daily. Swallow whole.   BD PEN NEEDLE NANO U/F 32G X 4 MM MISC USE WITH INSULIN PENS TO  DISPENSE INSULIN DAILY AS  DIRECTED   blood glucose meter kit and supplies Dispense based on patient and insurance preference. Use up to four times daily as directed. (FOR ICD-9 250.00, 250.01).   hydrochlorothiazide  (HYDRODIURIL) 25 MG tablet Take 25 mg by mouth daily.   insulin glargine (LANTUS SOLOSTAR) 100 UNIT/ML Solostar Pen Inject 55 Units into the skin every morning. And pen needles 1/day   metoprolol succinate (TOPROL XL) 25 MG 24 hr tablet Take 1 tablet (25 mg total) by mouth daily.   nitroGLYCERIN (NITROSTAT) 0.4 MG SL tablet Place 1 tablet (0.4 mg total) under the tongue every 5 (five) minutes as needed for chest pain (Take this medicine by mouth as needed. At the first sign of chest pain or tightness place one tablet under your tongue. Let the tablet dissolve under the tongue. Do not swallow whole.  Do not eat or drink, smoke while a tablet is dissolving. If you are not better within 5 minutes after taking ONE dose of nitroglycerin, you may take a SECOND dose. IF YOU ARE GETTING TO THE 3RD DOSE CALL 911.  Do not take more than 3 nitroglycerin tablet.).   rosuvastatin (CRESTOR) 40 MG tablet Take 1 tablet (40 mg total) by mouth daily.   [DISCONTINUED] rosuvastatin (CRESTOR) 20 MG tablet Take 20 mg by mouth daily.     Allergies:   Patient has no known allergies.   Social History   Socioeconomic History   Marital status: Married  Spouse name: Not on file   Number of children: Not on file   Years of education: Not on file   Highest education level: Not on file  Occupational History   Not on file  Tobacco Use   Smoking status: Never   Smokeless tobacco: Never  Substance and Sexual Activity   Alcohol use: No   Drug use: No   Sexual activity: Not on file  Other Topics Concern   Not on file  Social History Narrative   Not on file   Social Determinants of Health   Financial Resource Strain: Not on file  Food Insecurity: Not on file  Transportation Needs: Not on file  Physical Activity: Not on file  Stress: Not on file  Social Connections: Not on file    Social: active in his church and comes with his wife  Family History: The patient's family history includes Aneurysm (age of  onset: 5) in his father; Diabetes in his sister.  Brain Aneurysm. No heart problems.  ROS:   Please see the history of present illness.     All other systems reviewed and are negative.  EKGs/Labs/Other Studies Reviewed:    The following studies were reviewed today:  EKG:   01/10/21: SR rate 82 inferior infarct  Cardiac/NonCardiac CT : Date: 01/19/21 Results: 1. Coronary calcium score of 201 (LAD). This was 76 percentile for age and sex matched control.   2. Normal coronary origin with right dominance.   3. Moderate RCA proximal stenosis, mild to moderate mid LAD stenosis - FFR is normal. Small caliber OM2 stenosis 75-99% stenosis mid vessel, too small for PCI.   4.  Aortic atherosclerosis.   Recent Labs: 01/10/2021: BUN 19; Creatinine, Ser 1.23; Potassium 3.7; Sodium 142  Recent Lipid Panel No results found for: CHOL, TRIG, HDL, CHOLHDL, VLDL, LDLCALC, LDLDIRECT   Physical Exam:    VS:  BP (!) 156/82   Pulse 67   Ht $R'5\' 9"'qV$  (1.753 m)   Wt 220 lb (99.8 kg)   SpO2 98%   BMI 32.49 kg/m     Wt Readings from Last 3 Encounters:  02/28/21 220 lb (99.8 kg)  01/10/21 210 lb (95.3 kg)  12/15/20 213 lb (96.6 kg)     Gen: no distress   Neck: No JVD,  Ears: Left ear Pilar Plate Sign Cardiac: No Rubs or Gallops, no Murmur, normal rhythm +2 radial pulses Respiratory: Clear to auscultation bilaterally, normal effort, normal  respiratory rate GI: Soft, nontender, non-distended  MS: No  edema;  moves all extremities Integument: Skin feels warm Neuro:  At time of evaluation, alert and oriented to person/place/time/situation  Psych: Normal affect, patient feels well   ASSESSMENT:    1. Coronary artery disease involving coronary bypass graft of native heart with angina pectoris ([place])   2. Hyperlipidemia, unspecified hyperlipidemia type   3. Aortic atherosclerosis ([place])   4. Hypertension associated with diabetes ([place])     PLAN:    CAD HTN with DM Aortic  Atherosclerosis - asymptomatic  - anatomy: mod RCA and OM2 is too small for intervention (not FFR modeled) - continue ASA 81 mg;  - increase rosuvastatin, goal LDL < 55 - continue BB - continue PRN nitrates - reviewed CT with patient - will start ambulatory BP monitoring; if elevated increase norvasc to 10 - 6 month f/u me or APP   Medication Adjustments/Labs and Tests Ordered: Current medicines are reviewed at length with the patient today.  Concerns regarding medicines are outlined above.  Orders Placed This Encounter  Procedures   Lipid panel   ALT     Meds ordered this encounter  Medications   rosuvastatin (CRESTOR) 40 MG tablet    Sig: Take 1 tablet (40 mg total) by mouth daily.    Dispense:  90 tablet    Refill:  3      Patient Instructions  Medication Instructions:  Your physician has recommended you make the following change in your medication:  INCREASE: rosuvastatin (Crestor) to 40 mg by mouth daily  *If you need a refill on your cardiac medications before your next appointment, please call your pharmacy*   Lab Work: IN 3 MONTHS: Fasting lipid panel and ALT  If you have labs (blood work) drawn today and your tests are completely normal, you will receive your results only by: [place] (if you have MyChart) OR A paper copy in the mail If you have any lab test that is abnormal or we need to change your treatment, we will call you to review the results.   Testing/Procedures: NONE   Follow-Up: At [facility] you and your health needs are our priority.  As part of our continuing mission to provide you with exceptional heart care, we have created designated Provider Care Teams.  These Care Teams include your primary Cardiologist (physician) and Advanced Practice Providers (APPs -  Physician Assistants and Nurse Practitioners) who all work together to provide you with the care you need, when you need it.    Your next appointment:   6  month(s)  The format for your next appointment:   In Person  Provider:   Werner Lean, MD  or Melina Copa, PA-C or Ermalinda Barrios, PA-C          Other Instructions Perform ambulatory blood pressure monitoring     Signed, Werner Lean, MD  02/28/2021 9:14 AM    [place]

## 2021-02-28 ENCOUNTER — Other Ambulatory Visit: Payer: Self-pay

## 2021-02-28 ENCOUNTER — Ambulatory Visit: Payer: Medicare Other | Admitting: Internal Medicine

## 2021-02-28 ENCOUNTER — Encounter: Payer: Self-pay | Admitting: Internal Medicine

## 2021-02-28 VITALS — BP 156/82 | HR 67 | Ht 69.0 in | Wt 220.0 lb

## 2021-02-28 DIAGNOSIS — I152 Hypertension secondary to endocrine disorders: Secondary | ICD-10-CM

## 2021-02-28 DIAGNOSIS — E785 Hyperlipidemia, unspecified: Secondary | ICD-10-CM

## 2021-02-28 DIAGNOSIS — I700 Atherosclerosis of aorta: Secondary | ICD-10-CM | POA: Diagnosis not present

## 2021-02-28 DIAGNOSIS — E1159 Type 2 diabetes mellitus with other circulatory complications: Secondary | ICD-10-CM | POA: Diagnosis not present

## 2021-02-28 DIAGNOSIS — I25709 Atherosclerosis of coronary artery bypass graft(s), unspecified, with unspecified angina pectoris: Secondary | ICD-10-CM | POA: Diagnosis not present

## 2021-02-28 MED ORDER — ROSUVASTATIN CALCIUM 40 MG PO TABS: 40.0000 mg | ORAL_TABLET | Freq: Every day | ORAL | 3 refills | Status: DC

## 2021-02-28 NOTE — Patient Instructions (Signed)
Medication Instructions:  Your physician has recommended you make the following change in your medication:  INCREASE: rosuvastatin (Crestor) to 40 mg by mouth daily  *If you need a refill on your cardiac medications before your next appointment, please call your pharmacy*   Lab Work: IN 3 MONTHS: Fasting lipid panel and ALT  If you have labs (blood work) drawn today and your tests are completely normal, you will receive your results only by: [place] (if you have MyChart) OR A paper copy in the mail If you have any lab test that is abnormal or we need to change your treatment, we will call you to review the results.   Testing/Procedures: NONE   Follow-Up: At [facility], you and your health needs are our priority.  As part of our continuing mission to provide you with exceptional heart care, we have created designated Provider Care Teams.  These Care Teams include your primary Cardiologist (physician) and Advanced Practice Providers (APPs -  Physician Assistants and Nurse Practitioners) who all work together to provide you with the care you need, when you need it.    Your next appointment:   6 month(s)  The format for your next appointment:   In Person  Provider:   Werner Lean, MD  or Melina Copa, PA-C or Ermalinda Barrios, PA-C          Other Instructions Perform ambulatory blood pressure monitoring

## 2021-03-23 ENCOUNTER — Telehealth: Payer: Self-pay | Admitting: Internal Medicine

## 2021-03-23 NOTE — Telephone Encounter (Signed)
Pt c/o medication issue:  1. Name of Medication:  metoprolol succinate (TOPROL XL) 25 MG 24 hr tablet amLODipine (NORVASC) 5 MG tablet hydrochlorothiazide (HYDRODIURIL) 25 MG tablet   2. How are you currently taking this medication (dosage and times per day)?  Patient's wife states he takes the medication 3. Are you having a reaction (difficulty breathing--STAT)?   4. What is your medication issue?  Patient's wife states the patient is taking all these medications to regulate his BP, but she assumes it is too much because he is starting to become dizzy.  STAT if patient feels like he/she is going to faint   Are you dizzy now?  No   Do you feel faint or have you passed out?  No, but patient's wife states when the patient becomes dizzy he feels terrible  Do you have any other symptoms?  No   Have you checked your HR and BP (record if available)?  No log available, but patient's wife plans to have readings ready when her call is returned.

## 2021-03-23 NOTE — Telephone Encounter (Signed)
Spoke with pt and he states that he has been having intermittent dizziness x 2 days.  Does not feel like he is going to pass out.  BP today was 146/68.  Pt said their main reason for calling was to find out why he needed to be on 3 different BP medications.  Advised pt that some people require multiple medications to pull the BP down.  Explained that each class of medication works with the heart differently to accomplish that goal.  Pt has been on Metoprolol. Amlodipine and HCTZ for some time now.   Advised pt to continue to monitor BP and gave parameters on when to call.  Advised if dizziness continues, reach out to PCP as he may be having some vertigo.  Denies current dizziness.  Pt verbalized understanding and was in agreement with plan.

## 2021-03-23 NOTE — Telephone Encounter (Signed)
Called wife and asked to speak to pt to obtain permission to review his care with her as we do not have a DPR on file.  She states pt just left and to call his cell phone.  Called pt's cell number and left message to call back.

## 2021-04-19 ENCOUNTER — Other Ambulatory Visit: Payer: Self-pay

## 2021-04-19 ENCOUNTER — Other Ambulatory Visit: Payer: Medicare Other

## 2021-04-19 DIAGNOSIS — E785 Hyperlipidemia, unspecified: Secondary | ICD-10-CM

## 2021-04-19 LAB — LIPID PANEL
Chol/HDL Ratio: 3.3 ratio (ref 0.0–5.0)
Cholesterol, Total: 117 mg/dL (ref 100–199)
HDL: 36 mg/dL — ABNORMAL LOW (ref >39)
LDL Chol Calc (NIH): 63 mg/dL (ref 0–99)
Triglycerides: 90 mg/dL (ref 0–149)
VLDL Cholesterol Cal: 18 mg/dL (ref 5–40)

## 2021-04-19 LAB — ALT: ALT: 20 IU/L (ref 0–44)

## 2021-04-20 ENCOUNTER — Ambulatory Visit: Payer: Medicare Other | Admitting: Endocrinology

## 2021-04-20 ENCOUNTER — Telehealth: Payer: Self-pay

## 2021-04-20 VITALS — BP 160/90 | HR 52 | Ht 69.0 in | Wt 216.8 lb

## 2021-04-20 DIAGNOSIS — E109 Type 1 diabetes mellitus without complications: Secondary | ICD-10-CM | POA: Diagnosis not present

## 2021-04-20 LAB — POCT GLYCOSYLATED HEMOGLOBIN (HGB A1C): Hemoglobin A1C: 10.3 % — AB (ref 4.0–5.6)

## 2021-04-20 MED ORDER — LANTUS SOLOSTAR 100 UNIT/ML ~~LOC~~ SOPN: 65.0000 [IU] | PEN_INJECTOR | SUBCUTANEOUS | 3 refills | Status: DC

## 2021-04-20 MED ORDER — FREESTYLE LIBRE 2 SENSOR MISC: 1.0000 | 3 refills | Status: DC

## 2021-04-20 MED ORDER — EZETIMIBE 10 MG PO TABS: 10.0000 mg | ORAL_TABLET | Freq: Every day | ORAL | 3 refills | Status: DC

## 2021-04-20 MED ORDER — FREESTYLE LIBRE 2 READER DEVI: 1.0000 | Freq: Once | 1 refills | Status: AC

## 2021-04-20 NOTE — Telephone Encounter (Signed)
-----   Message from Werner Lean, MD sent at 04/20/2021  8:28 AM EST ----- Results: LDL has improved and near goal Plan: Zetia 10 mg PO daily Results to PCP  Werner Lean, MD

## 2021-04-20 NOTE — Patient Instructions (Addendum)
Your blood pressure is high today.  Please follow this up with your heart doctor.   Please increase the Lantus to 65 units each morning. check your blood sugar twice a day.  vary the time of day when you check, between before the 3 meals, and at bedtime.  also check if you have symptoms of your blood sugar being too high or too low.  please keep a record of the readings and bring it to your next appointment here (or you can bring the meter itself).  You can write it on any piece of paper.  please call us sooner if your blood sugar goes below 70, or if you have a lot of readings over 200.   Please come back for a follow-up appointment in 2 months.

## 2021-04-20 NOTE — Telephone Encounter (Signed)
The patient has been notified of the result and verbalized understanding.  All questions (if any) were answered. Antonieta Iba, RN 04/20/2021 10:50 AM  Rx for zetia 10 mg has been sent in

## 2021-04-20 NOTE — Progress Notes (Signed)
Subjective:    Patient ID: Alec Jacobson, male    DOB: 12/05/1945, 76 y.o.   MRN: [id number]  HPI Pt returns for f/u of diabetes mellitus:  DM type: 1  Dx'ed: 2017.  Complications: CRI.   Therapy: insulin since dx.   DKA: mild, in 2017.   Severe hypoglycemia: never.   Pancreatitis: never.   SDOH: he could not afford Trulicity.   Other: he takes QD insulin, after poor results with multiple daily injections; he declines pump rx; fructosamine has confirmed A1c.   Interval history: no cbg record, but states cbg's vary from 180-275.  Pt says he never misses insulin.  pt states he feels well in general.  No recent steroids.  He takes 55 units qam.   Past Medical History:  Diagnosis Date   Cancer (HCC)    prostate   Chronic kidney disease    over 30 yrs ago, had one stone   Diabetes mellitus without complication (HCC)    H/O allergic rhinitis    job related   Hypertension     Past Surgical History:  Procedure Laterality Date   NASAL SINUS SURGERY     SINUS ENDO WITH FUSION Bilateral 04/13/2014   Procedure: SINUS ENDO WITH FUSION;  Surgeon: Melvenia Beam, MD;  Location: [facility] OR;  Service: ENT;  Laterality: Bilateral;   TONSILLECTOMY      Social History   Socioeconomic History   Marital status: Married    Spouse name: Not on file   Number of children: Not on file   Years of education: Not on file   Highest education level: Not on file  Occupational History   Not on file  Tobacco Use   Smoking status: Never   Smokeless tobacco: Never  Substance and Sexual Activity   Alcohol use: No   Drug use: No   Sexual activity: Not on file  Other Topics Concern   Not on file  Social History Narrative   Not on file   Social Determinants of Health   Financial Resource Strain: Not on file  Food Insecurity: Not on file  Transportation Needs: Not on file  Physical Activity: Not on file  Stress: Not on file  Social Connections: Not on file  Intimate Partner Violence: Not on file     Current Outpatient Medications on File Prior to Visit  Medication Sig Dispense Refill   amLODipine (NORVASC) 5 MG tablet Take 1 tablet (5 mg total) by mouth daily. 30 tablet 1   aspirin EC 81 MG tablet Take 81 mg by mouth daily. Swallow whole.     BD PEN NEEDLE NANO U/F 32G X 4 MM MISC USE WITH INSULIN PENS TO  DISPENSE INSULIN DAILY AS  DIRECTED 90 each 11   blood glucose meter kit and supplies Dispense based on patient and insurance preference. Use up to four times daily as directed. (FOR ICD-9 250.00, 250.01). 1 each 0   hydrochlorothiazide (HYDRODIURIL) 25 MG tablet Take 25 mg by mouth daily.     metoprolol succinate (TOPROL XL) 25 MG 24 hr tablet Take 1 tablet (25 mg total) by mouth daily. 90 tablet 3   nitroGLYCERIN (NITROSTAT) 0.4 MG SL tablet Place 1 tablet (0.4 mg total) under the tongue every 5 (five) minutes as needed for chest pain (Take this medicine by mouth as needed. At the first sign of chest pain or tightness place one tablet under your tongue. Let the tablet dissolve under the tongue. Do not swallow whole.  Do not eat or drink, smoke while a tablet is dissolving. If you are not better within 5 minutes after taking ONE dose of nitroglycerin, you may take a SECOND dose. IF YOU ARE GETTING TO THE 3RD DOSE CALL 911.  Do not take more than 3 nitroglycerin tablet.). 25 tablet 3   rosuvastatin (CRESTOR) 40 MG tablet Take 1 tablet (40 mg total) by mouth daily. 90 tablet 3   No current facility-administered medications on file prior to visit.    No Known Allergies  Family History  Problem Relation Age of Onset   Aneurysm Father 62   Diabetes Sister     BP (!) 160/90    Pulse (!) 52    Ht $R'5\' 9"'Nx$  (1.753 m)    Wt 216 lb 12.8 oz (98.3 kg)    SpO2 97%    BMI 32.02 kg/m    Review of Systems     Objective:   Physical Exam VITAL SIGNS:  See vs page GENERAL: no distress Pulses: dorsalis pedis intact bilat.   MSK: no deformity of the feet CV: trace bilat leg edema Skin:  no  ulcer on the feet.  normal color and temp on the feet. Neuro: sensation is intact to touch on the feet    Lab Results  Component Value Date   HGBA1C 10.3 (A) 04/20/2021      Assessment & Plan:  Type 1 DM: uncontrolled.   Patient Instructions  Your blood pressure is high today.  Please follow this up with your heart doctor.   Please increase the Lantus to 65 units each morning. check your blood sugar twice a day.  vary the time of day when you check, between before the 3 meals, and at bedtime.  also check if you have symptoms of your blood sugar being too high or too low.  please keep a record of the readings and bring it to your next appointment here (or you can bring the meter itself).  You can write it on any piece of paper.  please call us sooner if your blood sugar goes below 70, or if you have a lot of readings over 200.   Please come back for a follow-up appointment in 2 months.

## 2021-04-23 ENCOUNTER — Ambulatory Visit: Payer: Medicare Other | Admitting: Internal Medicine

## 2021-04-29 ENCOUNTER — Other Ambulatory Visit: Payer: Self-pay

## 2021-04-29 ENCOUNTER — Ambulatory Visit
Admission: EM | Admit: 2021-04-29 | Discharge: 2021-04-29 | Disposition: A | Discharge status: Home / Self Care | Payer: Medicare Other | Attending: Physician Assistant | Admitting: Physician Assistant

## 2021-04-29 ENCOUNTER — Encounter: Payer: Self-pay | Admitting: Emergency Medicine

## 2021-04-29 DIAGNOSIS — J069 Acute upper respiratory infection, unspecified: Secondary | ICD-10-CM | POA: Diagnosis not present

## 2021-04-29 NOTE — ED Triage Notes (Signed)
Pt here for cough and generalized weakness x 2 days; pt had negative covid test

## 2021-04-29 NOTE — ED Provider Notes (Signed)
[facility]    CSN: [id number] Arrival date & time: 04/29/21  0845      History   Chief Complaint Chief Complaint  Patient presents with   Cough    HPI Alec Jacobson is a 76 y.o. male.   Patient here today for evaluation of cough and generalized weakness he has experienced for the last 2 days. He reports that he has not had fever. Cough waxes and wanes. He has taken 2 at home covid tests that were negative. He has taken nyquil which did seem to help with sleep.   The history is provided by the patient.  Cough Associated symptoms: no chills, no ear pain, no eye discharge, no fever, no shortness of breath and no sore throat    Past Medical History:  Diagnosis Date   Cancer ([place])    prostate   Chronic kidney disease    over 30 yrs ago, had one stone   Diabetes mellitus without complication ([place])    H/O allergic rhinitis    job related   Hypertension     Patient Active Problem List   Diagnosis Date Noted   Aortic atherosclerosis ([place]) 02/28/2021   Coronary artery disease involving coronary bypass graft of native heart with angina pectoris ([place]) 02/28/2021   History of diabetic ketoacidosis 01/10/2021   Hypertension associated with diabetes ([place]) 04/22/2015   Diabetes ([place]) 04/22/2015   Hyperlipidemia 04/22/2015    Past Surgical History:  Procedure Laterality Date   NASAL SINUS SURGERY     SINUS ENDO WITH FUSION Bilateral 04/13/2014   Procedure: SINUS ENDO WITH FUSION;  Surgeon: Ruby Cola, MD;  Location: [facility];  Service: ENT;  Laterality: Bilateral;   TONSILLECTOMY         Home Medications    Prior to Admission medications   Medication Sig Start Date End Date Taking? Authorizing Provider  amLODipine (NORVASC) 5 MG tablet Take 1 tablet (5 mg total) by mouth daily. 04/26/15   Orson Eva, MD  aspirin EC 81 MG tablet Take 81 mg by mouth daily. Swallow whole.    [provider]  BD PEN NEEDLE NANO U/F 32G X 4 MM MISC USE WITH INSULIN PENS  TO  DISPENSE INSULIN DAILY AS  DIRECTED 07/20/20   Renato Shin, MD  blood glucose meter kit and supplies Dispense based on patient and insurance preference. Use up to four times daily as directed. (FOR ICD-9 250.00, 250.01). 04/26/15   Tat, Shanon Brow, MD  Continuous Blood Gluc Sensor (FREESTYLE LIBRE 2 SENSOR) MISC 1 Device by Does not apply route every 14 (fourteen) days. 04/20/21   Renato Shin, MD  ezetimibe (ZETIA) 10 MG tablet Take 1 tablet (10 mg total) by mouth daily. 04/20/21   Chandrasekhar, Lyda Kalata A, MD  hydrochlorothiazide (HYDRODIURIL) 25 MG tablet Take 25 mg by mouth daily. 01/02/21   [provider]  insulin glargine (LANTUS SOLOSTAR) 100 UNIT/ML Solostar Pen Inject 65 Units into the skin every morning. And pen needles 1/day 04/20/21   Renato Shin, MD  metoprolol succinate (TOPROL XL) 25 MG 24 hr tablet Take 1 tablet (25 mg total) by mouth daily. 01/22/21   Chandrasekhar, Terisa Starr, MD  nitroGLYCERIN (NITROSTAT) 0.4 MG SL tablet Place 1 tablet (0.4 mg total) under the tongue every 5 (five) minutes as needed for chest pain (Take this medicine by mouth as needed. At the first sign of chest pain or tightness place one tablet under your tongue. Let the tablet dissolve under the tongue. Do  not swallow whole.  Do not eat or drink, smoke while a tablet is dissolving. If you are not better within 5 minutes after taking ONE dose of nitroglycerin, you may take a SECOND dose. IF YOU ARE GETTING TO THE 3RD DOSE CALL 911.  Do not take more than 3 nitroglycerin tablet.). 01/22/21   Rudean Haskell A, MD  rosuvastatin (CRESTOR) 40 MG tablet Take 1 tablet (40 mg total) by mouth daily. 02/28/21   Werner Lean, MD    Family History Family History  Problem Relation Age of Onset   Aneurysm Father 52   Diabetes Sister     Social History Social History   Tobacco Use   Smoking status: Never   Smokeless tobacco: Never  Substance Use Topics   Alcohol use: No   Drug use: No      Allergies   Patient has no known allergies.   Review of Systems Review of Systems  Constitutional:  Negative for chills and fever.  HENT:  Positive for congestion. Negative for ear pain and sore throat.   Eyes:  Negative for discharge and redness.  Respiratory:  Positive for cough. Negative for shortness of breath.   Gastrointestinal:  Negative for abdominal pain, diarrhea, nausea and vomiting.    Physical Exam Triage Vital Signs ED Triage Vitals [04/29/21 1020]  Enc Vitals Group     BP (!) 156/75     Pulse Rate 68     Resp 18     Temp (!) 97.5 F (36.4 C)     Temp Source Oral     SpO2 98 %     Weight      Height      Head Circumference      Peak Flow      Pain Score 0     Pain Loc      Pain Edu?      Excl. in [place]?    No data found.  Updated Vital Signs BP (!) 156/75 (BP Location: Left Arm)    Pulse 68    Temp (!) 97.5 F (36.4 C) (Oral)    Resp 18    SpO2 98%      Physical Exam Vitals and nursing note reviewed.  Constitutional:      General: He is not in acute distress.    Appearance: Normal appearance. He is not ill-appearing.  HENT:     Head: Normocephalic and atraumatic.     Nose: Congestion present.     Mouth/Throat:     Mouth: Mucous membranes are moist.     Pharynx: Oropharynx is clear. No oropharyngeal exudate or posterior oropharyngeal erythema.  Eyes:     Conjunctiva/sclera: Conjunctivae normal.  Cardiovascular:     Rate and Rhythm: Normal rate and regular rhythm.     Heart sounds: Normal heart sounds. No murmur heard. Pulmonary:     Effort: Pulmonary effort is normal. No respiratory distress.     Breath sounds: Normal breath sounds. No wheezing, rhonchi or rales.  Skin:    General: Skin is warm and dry.  Neurological:     Mental Status: He is alert.  Psychiatric:        Mood and Affect: Mood normal.        Thought Content: Thought content normal.     UC Treatments / Results  Labs (all labs ordered are listed, but only abnormal  results are displayed) Labs Reviewed  NOVEL CORONAVIRUS, NAA    EKG   Radiology No  results found.  Procedures Procedures (including critical care time)  Medications Ordered in UC Medications - No data to display  Initial Impression / Assessment and Plan / UC Course  I have reviewed the triage vital signs and the nursing notes.  Pertinent labs & imaging results that were available during my care of the patient were reviewed by me and considered in my medical decision making (see chart for details).    Suspect viral etiology of symptoms- will order covid pcr screening. Recommend symptomatic treatment while awaiting results. Encouraged follow up with any further concerns.   Final Clinical Impressions(s) / UC Diagnoses   Final diagnoses:  Acute upper respiratory infection   Discharge Instructions   None    ED Prescriptions   None    PDMP not reviewed this encounter.   Francene Finders, PA-C 04/29/21 1204

## 2021-04-30 LAB — NOVEL CORONAVIRUS, NAA: SARS-CoV-2, NAA: NOT DETECTED

## 2021-04-30 LAB — SARS-COV-2, NAA 2 DAY TAT

## 2021-05-29 ENCOUNTER — Other Ambulatory Visit: Payer: Medicare Other

## 2021-06-07 ENCOUNTER — Other Ambulatory Visit: Payer: Self-pay

## 2021-06-07 MED ORDER — EZETIMIBE 10 MG PO TABS: 10.0000 mg | ORAL_TABLET | Freq: Every day | ORAL | 2 refills | Status: DC

## 2021-06-21 ENCOUNTER — Encounter: Payer: Self-pay | Admitting: Endocrinology

## 2021-06-21 ENCOUNTER — Ambulatory Visit: Payer: Medicare Other | Admitting: Endocrinology

## 2021-06-21 VITALS — BP 144/90 | HR 48 | Ht 69.0 in | Wt 214.6 lb

## 2021-06-21 DIAGNOSIS — E1065 Type 1 diabetes mellitus with hyperglycemia: Secondary | ICD-10-CM | POA: Diagnosis not present

## 2021-06-21 DIAGNOSIS — E109 Type 1 diabetes mellitus without complications: Secondary | ICD-10-CM

## 2021-06-21 LAB — POCT GLYCOSYLATED HEMOGLOBIN (HGB A1C): Hemoglobin A1C: 11.4 % — AB (ref 4.0–5.6)

## 2021-06-21 MED ORDER — LANTUS SOLOSTAR 100 UNIT/ML ~~LOC~~ SOPN: 65.0000 [IU] | PEN_INJECTOR | SUBCUTANEOUS | 3 refills | Status: DC

## 2021-06-21 NOTE — Progress Notes (Signed)
? ?Subjective:  ? ? Patient ID: Alec Jacobson, male    DOB: [birth year], 76 y.o.   MRN: [id number] ? ?HPI ?Pt returns for f/u of diabetes mellitus:  ?DM type: 1  ?Dx'ed: 2017.  ?Complications: CRI.   ?Therapy: insulin since dx.   ?DKA: mild, in 2017.   ?Severe hypoglycemia: never.   ?Pancreatitis: never.   ?SDOH: he could not afford Trulicity.   ?Other: he takes QD insulin, after poor results with multiple daily injections; he declines pump rx; fructosamine has confirmed A1c.   ?Interval history: no cbg record, but states cbg's are in the low-100's.  Pt says he never misses insulin.  pt states he feels well in general.  No recent steroids.  He still takes just 55 units qam.   ?Past Medical History:  ?Diagnosis Date  ? Cancer [facility])   ? prostate  ? Chronic kidney disease   ? over 30 yrs ago, had one stone  ? Diabetes mellitus without complication ([place])   ? H/O allergic rhinitis   ? job related  ? Hypertension   ? ? ?Past Surgical History:  ?Procedure Laterality Date  ? NASAL SINUS SURGERY    ? SINUS ENDO WITH FUSION Bilateral 04/13/2014  ? Procedure: SINUS ENDO WITH FUSION;  Surgeon: Ruby Cola, MD;  Location: [place];  Service: ENT;  Laterality: Bilateral;  ? TONSILLECTOMY    ? ? ?Social History  ? ?Socioeconomic History  ? Marital status: Married  ?  Spouse name: Not on file  ? Number of children: Not on file  ? Years of education: Not on file  ? Highest education level: Not on file  ?Occupational History  ? Not on file  ?Tobacco Use  ? Smoking status: Never  ? Smokeless tobacco: Never  ?Substance and Sexual Activity  ? Alcohol use: No  ? Drug use: No  ? Sexual activity: Not on file  ?Other Topics Concern  ? Not on file  ?Social History Narrative  ? Not on file  ? ?Social Determinants of Health  ? ?Financial Resource Strain: Not on file  ?Food Insecurity: Not on file  ?Transportation Needs: Not on file  ?Physical Activity: Not on file  ?Stress: Not on file  ?Social Connections: Not on file  ?Intimate Partner  Violence: Not on file  ? ? ?Current Outpatient Medications on File Prior to Visit  ?Medication Sig Dispense Refill  ? amLODipine (NORVASC) 5 MG tablet Take 1 tablet (5 mg total) by mouth daily. 30 tablet 1  ? aspirin EC 81 MG tablet Take 81 mg by mouth daily. Swallow whole.    ? BD PEN NEEDLE NANO U/F 32G X 4 MM MISC USE WITH INSULIN PENS TO  DISPENSE INSULIN DAILY AS  DIRECTED 90 each 11  ? blood glucose meter kit and supplies Dispense based on patient and insurance preference. Use up to four times daily as directed. (FOR ICD-9 250.00, 250.01). 1 each 0  ? Continuous Blood Gluc Sensor (FREESTYLE LIBRE 2 SENSOR) MISC 1 Device by Does not apply route every 14 (fourteen) days. 6 each 3  ? ezetimibe (ZETIA) 10 MG tablet Take 1 tablet (10 mg total) by mouth daily. 90 tablet 2  ? hydrochlorothiazide (HYDRODIURIL) 25 MG tablet Take 25 mg by mouth daily.    ? metoprolol succinate (TOPROL XL) 25 MG 24 hr tablet Take 1 tablet (25 mg total) by mouth daily. 90 tablet 3  ? nitroGLYCERIN (NITROSTAT) 0.4 MG SL tablet Place 1 tablet (0.4 mg  total) under the tongue every 5 (five) minutes as needed for chest pain (Take this medicine by mouth as needed. At the first sign of chest pain or tightness place one tablet under your tongue. Let the tablet dissolve under the tongue. Do not swallow whole.  Do not eat or drink, smoke while a tablet is dissolving. If you are not better within 5 minutes after taking ONE dose of nitroglycerin, you may take a SECOND dose. IF YOU ARE GETTING TO THE 3RD DOSE CALL 911.  Do not take more than 3 nitroglycerin tablet.). 25 tablet 3  ? rosuvastatin (CRESTOR) 40 MG tablet Take 1 tablet (40 mg total) by mouth daily. 90 tablet 3  ? ?No current facility-administered medications on file prior to visit.  ? ? ?No Known Allergies ? ?Family History  ?Problem Relation Age of Onset  ? Aneurysm Father 57  ? Diabetes Sister   ? ? ?BP (!) 144/90 (BP Location: Left Arm, Patient Position: Sitting, Cuff Size: Normal)    Pulse (!) 48   Ht $R'5\' 9"'Hz$  (1.753 m)   Wt 214 lb 9.6 oz (97.3 kg)   SpO2 96%   BMI 31.69 kg/m?  ? ? ?Review of Systems ?He denies hypoglycemia.   ?   ?Objective:  ? Physical Exam ?VITAL SIGNS:  See vs page ?GENERAL: no distress ?Pulses: dorsalis pedis intact bilat.   ?MSK: no deformity of the feet ?CV: no leg edema.   ?Skin:  no ulcer on the feet.  normal color and temp on the feet.   ?Neuro: sensation is intact to touch on the feet.   ? ?Lab Results  ?Component Value Date  ? HGBA1C 11.4 (A) 06/21/2021  ? ? ?   ?Assessment & Plan:  ?Type 1 DM: uncontrolled ? ?Patient Instructions  ?Please increase the Lantus to 65 units each morning.   ?check your blood sugar twice a day.  vary the time of day when you check, between before the 3 meals, and at bedtime.  also check if you have symptoms of your blood sugar being too high or too low.  please keep a record of the readings and bring it to your next appointment here (or you can bring the meter itself).  You can write it on any piece of paper.  please call us sooner if your blood sugar goes below 70, or if you have a lot of readings over 200.   ?Please come back for a follow-up appointment in 3 months.   ? ? ?

## 2021-06-21 NOTE — Patient Instructions (Addendum)
Please increase the Lantus to 65 units each morning.   ?check your blood sugar twice a day.  vary the time of day when you check, between before the 3 meals, and at bedtime.  also check if you have symptoms of your blood sugar being too high or too low.  please keep a record of the readings and bring it to your next appointment here (or you can bring the meter itself).  You can write it on any piece of paper.  please call us sooner if your blood sugar goes below 70, or if you have a lot of readings over 200.   ?Please come back for a follow-up appointment in 3 months.   ?

## 2021-09-19 NOTE — Progress Notes (Signed)
Office Visit    Patient Name: Alec Jacobson Date of Encounter: 09/24/2021  PCP:  Vincente Liberty, Victoria Vera  Cardiologist:  Werner Lean, MD  Advanced Practice Provider:  No care team member to display Electrophysiologist:  None   Chief Complaint    Alec Jacobson is a 76 y.o. male with a hx of hypertension, CAD (see moderate RCA lesion that was FFR negative on cardiac CT scan 12/2020) and diabetes mellitus, presents today for 61-monthfollow-up visit  He was last seen December 2022 by Dr. CGlenford Bayley  At that time he was doing well.  He shared that he did not have any chest pain or pressure.  He did not have any shortness of breath or dyspnea on exertion as well as no PND/orthopnea.  He had not had any weight gain or leg edema.  No palpitations or syncope.  Today, he states that when he saw Dr. CGlenford Bayleyback in December he was having a heart pain especially with changing positions.  He was started on a few medications and this has improved.  He has not had that feeling since.  He has been compliant on his medications and taking Norvasc, hydrochlorothiazide, metoprolol XL, and Zetia.  He states that his blood glucose could be better controlled.  He was going to a nutritionist for about a year and knows that he needs to be on a low-sodium [employer] diet.  He plans to make some small adjustments.  They also want to increase their distance with their walking.  He plans to take his blood pressure 1 time a day for 2 weeks and send me the values via MyChart.  We may need to increase his Norvasc based on those values.  Reports no shortness of breath nor dyspnea on exertion. Reports no chest pain, pressure, or tightness. No edema, orthopnea, PND. Reports no palpitations.    Past Medical History    Past Medical History:  Diagnosis Date   Cancer ([facility]    prostate   Chronic kidney disease    over 30 yrs ago, had one stone   [place]  [place]    H/O allergic rhinitis    job related   Hypertension    Past Surgical History:  Procedure Laterality Date   NASAL SINUS SURGERY     SINUS ENDO WITH FUSION Bilateral 04/13/2014   Procedure: SINUS ENDO WITH FUSION;  Surgeon: MRuby Cola MD;  [place]  Service: ENT;  Laterality: Bilateral;   TONSILLECTOMY      Allergies  No Known Allergies   EKGs/Labs/Other Studies Reviewed:   The following studies were reviewed today:  Cardiac/NonCardiac CT : Date: 01/19/21 Results: 1. Coronary calcium score of 201 (LAD). This was 622percentile for age and sex matched control.   2. Normal coronary origin with right dominance.   3. Moderate RCA proximal stenosis, mild to moderate mid LAD stenosis - FFR is normal. Small caliber OM2 stenosis 75-99% stenosis mid vessel, too small for PCI.   4.  Aortic atherosclerosis.  EKG:  EKG is  ordered today.  The ekg ordered today demonstrates normal sinus rhythm, rate 74 bpm.  Cannot rule out anterior infarct.  Recent Labs: 01/10/2021: BUN 19; Creatinine, Ser 1.23; Potassium 3.7; Sodium 142 04/19/2021: ALT 20  Recent Lipid Panel    Component Value Date/Time   CHOL 117 04/19/2021 0953   TRIG 90 04/19/2021 0953   HDL 36 (L) 04/19/2021 05809  CHOLHDL 3.3 04/19/2021 0953   LDLCALC 63 04/19/2021 0953    Home Medications   Current Meds  Medication Sig   amLODipine (NORVASC) 5 MG tablet Take 1 tablet (5 mg total) by mouth daily.   aspirin EC 81 MG tablet Take 81 mg by mouth daily. Swallow whole.   blood glucose meter kit and supplies Dispense based on patient and insurance preference. Use up to four times daily as directed. (FOR ICD-9 250.00, 250.01).   Continuous Blood Gluc Sensor (FREESTYLE LIBRE 2 SENSOR) MISC 1 Device by Does not apply route every 14 (fourteen) days.   ezetimibe (ZETIA) 10 MG tablet Take 1 tablet (10 mg total) by mouth daily.   hydrochlorothiazide (HYDRODIURIL) 25 MG tablet Take 25 mg by  mouth daily.   insulin glargine (LANTUS SOLOSTAR) 100 UNIT/ML Solostar Pen Inject 65 Units into the skin every morning. And pen needles 1/day   Insulin Pen Needle (BD PEN NEEDLE NANO U/F) 32G X 4 MM MISC USE WITH INSULIN PENS TO  DISPENSE INSULIN DAILY AS  DIRECTED   metoprolol succinate (TOPROL XL) 25 MG 24 hr tablet Take 1 tablet (25 mg total) by mouth daily.   nitroGLYCERIN (NITROSTAT) 0.4 MG SL tablet Place 1 tablet (0.4 mg total) under the tongue every 5 (five) minutes as needed for chest pain (Take this medicine by mouth as needed. At the first sign of chest pain or tightness place one tablet under your tongue. Let the tablet dissolve under the tongue. Do not swallow whole.  Do not eat or drink, smoke while a tablet is dissolving. If you are not better within 5 minutes after taking ONE dose of nitroglycerin, you may take a SECOND dose. IF YOU ARE GETTING TO THE 3RD DOSE CALL 911.  Do not take more than 3 nitroglycerin tablet.).   rosuvastatin (CRESTOR) 40 MG tablet Take 1 tablet (40 mg total) by mouth daily.     Review of Systems      All other systems reviewed and are otherwise negative except as noted above.  Physical Exam    VS:  BP (!) 150/76   Pulse 74   Ht _0  (1.753 m)   Wt 214 lb (97.1 kg)   SpO2 96%   BMI 31.60 kg/m  , BMI Body mass index is 31.6 kg/m.  Wt Readings from Last 3 Encounters:  09/24/21 214 lb (97.1 kg)  06/21/21 214 lb 9.6 oz (97.3 kg)  04/20/21 216 lb 12.8 oz (98.3 kg)     GEN: Well nourished, well developed, in no acute distress. HEENT: normal. Neck: Supple, no JVD, carotid bruits, or masses. Cardiac: RRR, no murmurs, rubs, or gallops. No clubbing, cyanosis, edema.  Radials/PT 2+ and equal bilaterally.  Respiratory:  Respirations regular and unlabored, clear to auscultation bilaterally. GI: Soft, nontender, nondistended. MS: No deformity or atrophy. Skin: Warm and dry, no rash. Neuro:  Strength and sensation are intact. Psych: Normal  affect.  Assessment & Plan    CAD -RCA FFR negative.  OM 2 too small for intervention (not FFR model) -Continue GDMT: ASA 81 mg, rosuvastatin 40 mg, metoprolol succinate 25 mg daily, Zetia 10 mg daily, and amlodipine 5 mg daily  HTN -Elevated today. Re-took at it was 152/84 -I asked him to take his blood pressure 1 time a day for 2 weeks and send me the values via MyChart. -May need to increase his Norvasc if remains elevated.  Diabetes mellitus with complication -He is on lantus -He needs better control.  Last A1c was 11.  Aortic atherosclerosis -Continue Zetia, statin intolerance   Disposition: Follow up  6 months  with Werner Lean, MD or APP.  Signed, Elgie Collard, PA-C 09/24/2021, 11:45 AM [facility]

## 2021-09-21 ENCOUNTER — Ambulatory Visit (HOSPITAL_COMMUNITY)
Admission: RE | Admit: 2021-09-21 | Discharge: 2021-09-21 | Disposition: A | Discharge status: Home / Self Care | Payer: Medicare Other | Source: Ambulatory Visit | Attending: Pulmonary Disease | Admitting: Pulmonary Disease

## 2021-09-21 ENCOUNTER — Other Ambulatory Visit (HOSPITAL_COMMUNITY): Payer: Self-pay | Admitting: Pulmonary Disease

## 2021-09-21 DIAGNOSIS — M545 Low back pain, unspecified: Secondary | ICD-10-CM | POA: Diagnosis present

## 2021-09-24 ENCOUNTER — Other Ambulatory Visit: Payer: Self-pay

## 2021-09-24 ENCOUNTER — Ambulatory Visit: Payer: Medicare Other | Admitting: Physician Assistant

## 2021-09-24 ENCOUNTER — Encounter: Payer: Self-pay | Admitting: Physician Assistant

## 2021-09-24 VITALS — BP 150/76 | HR 74 | Ht 69.0 in | Wt 214.0 lb

## 2021-09-24 DIAGNOSIS — I25709 Atherosclerosis of coronary artery bypass graft(s), unspecified, with unspecified angina pectoris: Secondary | ICD-10-CM | POA: Diagnosis not present

## 2021-09-24 DIAGNOSIS — I700 Atherosclerosis of aorta: Secondary | ICD-10-CM

## 2021-09-24 DIAGNOSIS — E109 Type 1 diabetes mellitus without complications: Secondary | ICD-10-CM | POA: Diagnosis not present

## 2021-09-24 DIAGNOSIS — I10 Essential (primary) hypertension: Secondary | ICD-10-CM | POA: Diagnosis not present

## 2021-09-24 DIAGNOSIS — I2510 Atherosclerotic heart disease of native coronary artery without angina pectoris: Secondary | ICD-10-CM | POA: Diagnosis not present

## 2021-09-24 MED ORDER — BD PEN NEEDLE NANO U/F 32G X 4 MM MISC: 11 refills | Status: DC

## 2021-09-24 NOTE — Patient Instructions (Signed)
Medication Instructions:  Your physician recommends that you continue on your current medications as directed. Please refer to the Current Medication list given to you today.  *If you need a refill on your cardiac medications before your next appointment, please call your pharmacy*   Lab Work: None If you have labs (blood work) drawn today and your tests are completely normal, you will receive your results only by: [place] (if you have MyChart) OR A paper copy in the mail If you have any lab test that is abnormal or we need to change your treatment, we will call you to review the results.   Follow-Up: At [facility], you and your health needs are our priority.  As part of our continuing mission to provide you with exceptional heart care, we have created designated Provider Care Teams.  These Care Teams include your primary Cardiologist (physician) and Advanced Practice Providers (APPs -  Physician Assistants and Nurse Practitioners) who all work together to provide you with the care you need, when you need it.   Your next appointment:   6 month(s)  The format for your next appointment:   In Person  Provider:   Werner Lean, MD or APP  Other Instructions Take your blood pressure daily over the next 2 weeks and send [place] the reading via your MyChart.  Important Information About Sugar

## 2021-11-19 ENCOUNTER — Ambulatory Visit: Payer: Medicare Other | Admitting: Internal Medicine

## 2021-11-19 ENCOUNTER — Encounter: Payer: Self-pay | Admitting: Internal Medicine

## 2021-11-19 VITALS — BP 140/90 | HR 58 | Ht 69.0 in | Wt 211.0 lb

## 2021-11-19 DIAGNOSIS — E1165 Type 2 diabetes mellitus with hyperglycemia: Secondary | ICD-10-CM | POA: Diagnosis not present

## 2021-11-19 DIAGNOSIS — Z794 Long term (current) use of insulin: Secondary | ICD-10-CM | POA: Diagnosis not present

## 2021-11-19 LAB — POCT GLUCOSE (DEVICE FOR HOME USE): POC Glucose: 225 mg/dl — AB (ref 70–99)

## 2021-11-19 LAB — POCT GLYCOSYLATED HEMOGLOBIN (HGB A1C): Hemoglobin A1C: 10 % — AB (ref 4.0–5.6)

## 2021-11-19 MED ORDER — METFORMIN HCL ER 500 MG PO TB24: 1000.0000 mg | ORAL_TABLET | Freq: Every day | ORAL | 2 refills | Status: DC

## 2021-11-19 MED ORDER — BD PEN NEEDLE NANO U/F 32G X 4 MM MISC: 1.0000 | Freq: Every day | 3 refills | Status: DC

## 2021-11-19 MED ORDER — LANTUS SOLOSTAR 100 UNIT/ML ~~LOC~~ SOPN: 65.0000 [IU] | PEN_INJECTOR | SUBCUTANEOUS | 3 refills | Status: DC

## 2021-11-19 NOTE — Progress Notes (Signed)
Name: Alec Jacobson  Age/ Sex: 76 y.o., male   MRN/ DOB: [id number], 1946/02/05     PCP: Vincente Liberty, MD   Reason for Endocrinology Evaluation: Type 2 Diabetes Mellitus  Initial Endocrine Consultative Visit: 05/24/2015    PATIENT IDENTIFIER: Alec Jacobson is a 76 y.o. male with a past medical history of CAD, DM, dyslipidemia . The patient has followed with Endocrinology clinic since 05/24/2015 for consultative assistance with management of his diabetes.  DIABETIC HISTORY:  Mr. Altergott was diagnosed with T1DM in 2017. His hemoglobin A1c has ranged from 5.8 % in 2018, peaking at 14.9% in 2021.   GLP-1 agonists was cost prohibitive   SUBJECTIVE:   During the last visit (06/21/2021): saw Dr Loanne Drilling   Today (11/19/2021): Alec Jacobson is here for a follow up on diabetes management. He is accompanied by his spouse today. He checks his blood sugars 0 times daily, because he does not like pricking his finger.   He admits to snacking at night  Denies nausea, vomiting or diarrhea   HOME DIABETES REGIMEN:  Lantus 65 units daily      Statin: yes ACE-I/ARB: yes    METER DOWNLOAD SUMMARY: Did not bring       DIABETIC COMPLICATIONS: Microvascular complications:   Denies: CKD  Last Eye Exam: Completed 2022  Macrovascular complications:  CAD Denies:  CVA, PVD   HISTORY:  Past Medical History:  Past Medical History:  Diagnosis Date   Cancer ([place])    prostate   Chronic kidney disease    over 30 yrs ago, had one stone   Diabetes mellitus without complication ([place])    H/O allergic rhinitis    job related   Hypertension    Past Surgical History:  Past Surgical History:  Procedure Laterality Date   NASAL SINUS SURGERY     SINUS ENDO WITH FUSION Bilateral 04/13/2014   Procedure: SINUS ENDO WITH FUSION;  Surgeon: Ruby Cola, MD;  Location: [facility];  Service: ENT;  Laterality: Bilateral;   TONSILLECTOMY     Social History:  reports that he has never smoked.  He has never used smokeless tobacco. He reports that he does not drink alcohol and does not use drugs. Family History:  Family History  Problem Relation Age of Onset   Aneurysm Father 41   Diabetes Sister      HOME MEDICATIONS: Allergies as of 11/19/2021   No Known Allergies      Medication List        Accurate as of November 19, 2021  3:54 PM. If you have any questions, ask your nurse or doctor.          amLODipine 5 MG tablet Commonly known as: NORVASC Take 1 tablet (5 mg total) by mouth daily.   aspirin EC 81 MG tablet Take 81 mg by mouth daily. Swallow whole.   BD Pen Needle Nano U/F 32G X 4 MM Misc Generic drug: Insulin Pen Needle 1 Device by Other route daily in the afternoon. USE WITH INSULIN PENS TO  DISPENSE INSULIN DAILY AS  DIRECTED What changed:  how much to take how to take this when to take this Changed by: Dorita Sciara, MD   blood glucose meter kit and supplies Dispense based on patient and insurance preference. Use up to four times daily as directed. (FOR ICD-9 250.00, 250.01).   ezetimibe 10 MG tablet Commonly known as: ZETIA Take 1 tablet (10 mg total) by mouth daily.   FreeStyle  Libre 2 Sensor Misc 1 Device by Does not apply route every 14 (fourteen) days.   hydrochlorothiazide 25 MG tablet Commonly known as: HYDRODIURIL Take 25 mg by mouth daily.   Lantus SoloStar 100 UNIT/ML Solostar Pen Generic drug: insulin glargine Inject 65 Units into the skin every morning. And pen needles 1/day   metFORMIN 500 MG 24 hr tablet Commonly known as: GLUCOPHAGE-XR Take 2 tablets (1,000 mg total) by mouth daily with breakfast. Started by: Dorita Sciara, MD   metoprolol succinate 25 MG 24 hr tablet Commonly known as: Toprol XL Take 1 tablet (25 mg total) by mouth daily.   nitroGLYCERIN 0.4 MG SL tablet Commonly known as: NITROSTAT Place 1 tablet (0.4 mg total) under the tongue every 5 (five) minutes as needed for chest pain (Take  this medicine by mouth as needed. At the first sign of chest pain or tightness place one tablet under your tongue. Let the tablet dissolve under the tongue. Do not swallow whole.  Do not eat or drink, smoke while a tablet is dissolving. If you are not better within 5 minutes after taking ONE dose of nitroglycerin, you may take a SECOND dose. IF YOU ARE GETTING TO THE 3RD DOSE CALL 911.  Do not take more than 3 nitroglycerin tablet.).   rosuvastatin 40 MG tablet Commonly known as: CRESTOR Take 1 tablet (40 mg total) by mouth daily.         OBJECTIVE:   Vital Signs: BP (!) 140/90 (BP Location: Left Arm, Patient Position: Sitting, Cuff Size: Large)   Pulse (!) 58   Ht $R'5\' 9"'xr$  (1.753 m)   Wt 211 lb (95.7 kg)   SpO2 96%   BMI 31.16 kg/m   Wt Readings from Last 3 Encounters:  11/19/21 211 lb (95.7 kg)  09/24/21 214 lb (97.1 kg)  06/21/21 214 lb 9.6 oz (97.3 kg)     Exam: General: Pt appears well and is in NAD  Neck: General: Supple without adenopathy. Thyroid: Thyroid size normal.  No goiter or nodules appreciated.  Lungs: Clear with good BS bilat with no rales, rhonchi, or wheezes  Heart: RRR  Abdomen: Normoactive bowel sounds, soft, nontender, without masses or organomegaly palpable  Extremities: No pretibial edema. .  Neuro: MS is good with appropriate affect, pt is alert and Ox3    DM foot exam: 11/19/2021  The skin of the feet is intact without sores or ulcerations. The pedal pulses are 2+ on right and 2+ on left. The sensation is intact to a screening 5.07, 10 gram monofilament bilaterally          DATA REVIEWED:  Lab Results  Component Value Date   HGBA1C 10.0 (A) 11/19/2021   HGBA1C 11.4 (A) 06/21/2021   HGBA1C 10.3 (A) 04/20/2021   Lab Results  Component Value Date   LDLCALC 63 04/19/2021   CREATININE 1.23 01/10/2021   No results found for: "MICRALBCREAT"   Lab Results  Component Value Date   CHOL 117 04/19/2021   HDL 36 (L) 04/19/2021   LDLCALC 63  04/19/2021   TRIG 90 04/19/2021   CHOLHDL 3.3 04/19/2021         ASSESSMENT / PLAN / RECOMMENDATIONS:   1) Type 2 Diabetes Mellitus, poorly controlled, With macrovascular  complications - Most recent A1c of 10.0 %. Goal A1c < 7.0 %.    - Pt continues with hyperglycemia  - He is on insulin only, we discussed post-prandial hyperglycemia , we emphasized the importance of limiting  CHO intake and snacks when possible  - GLP-1 agonists and SGLT-2 inhibitors are cost prohibitive  - We discussed adding Metformin , cautioned against GI side effects . Pt in agreement , will titrate gradually  - No change to lantus dose  - He was encouraged to check glucose ayt least a couple of times a week - Post prandial BG in office 225 mg/dL    Plan: MEDICATIONS: Start Metformin 500 mg XR, 2 tabs daily  Continue Lantus 65 units daily   EDUCATION / INSTRUCTIONS: BG monitoring instructions: Patient is instructed to check his blood sugars 1 times a day. Call [facility] if: BG persistently < 70  I reviewed the Rule of 15 for the treatment of hypoglycemia in detail with the patient. Literature supplied.    2) Diabetic complications:  Eye: Does not have known diabetic retinopathy.  Neuro/ Feet: Does not have known diabetic peripheral neuropathy .  Renal: Patient does not have known baseline CKD. He   is not on an ACEI/ARB at present.     F/U in 4 months     Signed electronically by: Mack Guise, MD  [facility] Endocrinology  [address] [place], [place] [address] Phone: [phone] FAX: [phone]   CC: Vincente Liberty, [place] [address] Phone: [phone]  Fax: [phone]  Return to Endocrinology clinic as below: Future Appointments  Date Time Provider [place]  03/29/2022  8:00 AM Werner Lean, MD [facility]

## 2021-11-19 NOTE — Patient Instructions (Signed)
Start Metformin 1 tablet daily with Breakfast for 1 week, then increase to 2 tablets  daily  Continue Lantus 65 units daily    HOW TO TREAT LOW BLOOD SUGARS (Blood sugar LESS THAN 70 MG/DL) Please follow the RULE OF 15 for the treatment of hypoglycemia treatment (when your (blood sugars are less than 70 mg/dL)   STEP 1: Take 15 grams of carbohydrates when your blood sugar is low, which includes:  3-4 GLUCOSE TABS  OR 3-4 OZ OF JUICE OR REGULAR SODA OR ONE TUBE OF GLUCOSE GEL    STEP 2: RECHECK blood sugar in 15 MINUTES STEP 3: If your blood sugar is still low at the 15 minute recheck --> then, go back to STEP 1 and treat AGAIN with another 15 grams of carbohydrates.

## 2021-11-28 ENCOUNTER — Other Ambulatory Visit: Payer: Self-pay

## 2021-11-28 DIAGNOSIS — E1165 Type 2 diabetes mellitus with hyperglycemia: Secondary | ICD-10-CM

## 2021-11-28 MED ORDER — BD PEN NEEDLE NANO U/F 32G X 4 MM MISC: 3 refills | Status: DC

## 2022-03-12 ENCOUNTER — Other Ambulatory Visit: Payer: Self-pay

## 2022-03-12 MED ORDER — ROSUVASTATIN CALCIUM 40 MG PO TABS: 40.0000 mg | ORAL_TABLET | Freq: Every day | ORAL | 2 refills | Status: DC

## 2022-03-20 ENCOUNTER — Other Ambulatory Visit: Payer: Self-pay | Admitting: Internal Medicine

## 2022-03-23 ENCOUNTER — Inpatient Hospital Stay (HOSPITAL_COMMUNITY)
Admission: EM | Admit: 2022-03-23 | Discharge: 2022-03-26 | DRG: 638 | Disposition: A | Discharge status: Home / Self Care | Payer: Medicare Other | Attending: Internal Medicine | Admitting: Internal Medicine

## 2022-03-23 ENCOUNTER — Encounter (HOSPITAL_COMMUNITY): Payer: Self-pay

## 2022-03-23 ENCOUNTER — Other Ambulatory Visit: Payer: Self-pay

## 2022-03-23 DIAGNOSIS — E1110 Type 2 diabetes mellitus with ketoacidosis without coma: Principal | ICD-10-CM | POA: Diagnosis present

## 2022-03-23 DIAGNOSIS — Z833 Family history of diabetes mellitus: Secondary | ICD-10-CM

## 2022-03-23 DIAGNOSIS — Z794 Long term (current) use of insulin: Secondary | ICD-10-CM

## 2022-03-23 DIAGNOSIS — Z8249 Family history of ischemic heart disease and other diseases of the circulatory system: Secondary | ICD-10-CM

## 2022-03-23 DIAGNOSIS — R739 Hyperglycemia, unspecified: Principal | ICD-10-CM

## 2022-03-23 DIAGNOSIS — E860 Dehydration: Secondary | ICD-10-CM | POA: Diagnosis not present

## 2022-03-23 DIAGNOSIS — I2510 Atherosclerotic heart disease of native coronary artery without angina pectoris: Secondary | ICD-10-CM | POA: Diagnosis present

## 2022-03-23 DIAGNOSIS — E1165 Type 2 diabetes mellitus with hyperglycemia: Secondary | ICD-10-CM

## 2022-03-23 DIAGNOSIS — Z7982 Long term (current) use of aspirin: Secondary | ICD-10-CM

## 2022-03-23 DIAGNOSIS — E874 Mixed disorder of acid-base balance: Secondary | ICD-10-CM | POA: Diagnosis present

## 2022-03-23 DIAGNOSIS — Z79899 Other long term (current) drug therapy: Secondary | ICD-10-CM

## 2022-03-23 DIAGNOSIS — E785 Hyperlipidemia, unspecified: Secondary | ICD-10-CM | POA: Diagnosis present

## 2022-03-23 DIAGNOSIS — D751 Secondary polycythemia: Secondary | ICD-10-CM | POA: Diagnosis present

## 2022-03-23 DIAGNOSIS — E1310 Other specified diabetes mellitus with ketoacidosis without coma: Secondary | ICD-10-CM

## 2022-03-23 DIAGNOSIS — E876 Hypokalemia: Secondary | ICD-10-CM | POA: Diagnosis not present

## 2022-03-23 DIAGNOSIS — Z7984 Long term (current) use of oral hypoglycemic drugs: Secondary | ICD-10-CM

## 2022-03-23 DIAGNOSIS — I10 Essential (primary) hypertension: Secondary | ICD-10-CM | POA: Diagnosis present

## 2022-03-23 LAB — I-STAT VENOUS BLOOD GAS, ED
Acid-base deficit: 3 mmol/L — ABNORMAL HIGH (ref 0.0–2.0)
Acid-base deficit: 3 mmol/L — ABNORMAL HIGH (ref 0.0–2.0)
Bicarbonate: 25.3 mmol/L (ref 20.0–28.0)
Bicarbonate: 25.5 mmol/L (ref 20.0–28.0)
Calcium, Ion: 1.23 mmol/L (ref 1.15–1.40)
Calcium, Ion: 1.24 mmol/L (ref 1.15–1.40)
HCT: 52 % (ref 39.0–52.0)
HCT: 53 % — ABNORMAL HIGH (ref 39.0–52.0)
Hemoglobin: 17.7 g/dL — ABNORMAL HIGH (ref 13.0–17.0)
Hemoglobin: 18 g/dL — ABNORMAL HIGH (ref 13.0–17.0)
O2 Saturation: 63 %
O2 Saturation: 68 %
Patient temperature: 37
Potassium: 4.5 mmol/L (ref 3.5–5.1)
Potassium: 4.5 mmol/L (ref 3.5–5.1)
Sodium: 137 mmol/L (ref 135–145)
Sodium: 137 mmol/L (ref 135–145)
TCO2: 27 mmol/L (ref 22–32)
TCO2: 27 mmol/L (ref 22–32)
pCO2, Ven: 54.4 mmHg (ref 44–60)
pCO2, Ven: 55.2 mmHg (ref 44–60)
pH, Ven: 7.269 (ref 7.25–7.43)
pH, Ven: 7.279 (ref 7.25–7.43)
pO2, Ven: 37 mmHg (ref 32–45)
pO2, Ven: 41 mmHg (ref 32–45)

## 2022-03-23 LAB — URINALYSIS, ROUTINE W REFLEX MICROSCOPIC
Bacteria, UA: NONE SEEN
Bilirubin Urine: NEGATIVE
Glucose, UA: >=500 mg/dL — AB
Ketones, ur: 20 mg/dL — AB
Leukocytes,Ua: NEGATIVE
Nitrite: NEGATIVE
Protein, ur: 100 mg/dL — AB
Specific Gravity, Urine: 1.028 (ref 1.005–1.030)
pH: 5 (ref 5.0–8.0)

## 2022-03-23 LAB — BASIC METABOLIC PANEL
Anion gap: 16 — ABNORMAL HIGH (ref 5–15)
BUN: 24 mg/dL — ABNORMAL HIGH (ref 8–23)
CO2: 22 mmol/L (ref 22–32)
Calcium: 9.8 mg/dL (ref 8.9–10.3)
Chloride: 99 mmol/L (ref 98–111)
Creatinine, Ser: 1.39 mg/dL — ABNORMAL HIGH (ref 0.61–1.24)
GFR, Estimated: 53 mL/min — ABNORMAL LOW (ref >60)
Glucose, Bld: 584 mg/dL (ref 70–99)
Potassium: 4.4 mmol/L (ref 3.5–5.1)
Sodium: 137 mmol/L (ref 135–145)

## 2022-03-23 LAB — CBC
HCT: 52.5 % — ABNORMAL HIGH (ref 39.0–52.0)
Hemoglobin: 17.4 g/dL — ABNORMAL HIGH (ref 13.0–17.0)
MCH: 28.5 pg (ref 26.0–34.0)
MCHC: 33.1 g/dL (ref 30.0–36.0)
MCV: 86.1 fL (ref 80.0–100.0)
Platelets: 205 10*3/uL (ref 150–400)
RBC: 6.1 MIL/uL — ABNORMAL HIGH (ref 4.22–5.81)
RDW: 13.5 % (ref 11.5–15.5)
WBC: 7.1 10*3/uL (ref 4.0–10.5)
nRBC: 0 % (ref 0.0–0.2)

## 2022-03-23 LAB — CBG MONITORING, ED: Glucose-Capillary: 566 mg/dL (ref 70–99)

## 2022-03-23 NOTE — ED Triage Notes (Signed)
Pt arrived to triage complaining of hyperglycemia. Pt wife states that the pt has been fatigued and having some brain fog  Pt states that he hasn't had much of an appetite for several days  Pt denies missing any of his medications

## 2022-03-24 DIAGNOSIS — Z7982 Long term (current) use of aspirin: Secondary | ICD-10-CM | POA: Diagnosis not present

## 2022-03-24 DIAGNOSIS — Z79899 Other long term (current) drug therapy: Secondary | ICD-10-CM | POA: Diagnosis not present

## 2022-03-24 DIAGNOSIS — Z8249 Family history of ischemic heart disease and other diseases of the circulatory system: Secondary | ICD-10-CM | POA: Diagnosis not present

## 2022-03-24 DIAGNOSIS — E785 Hyperlipidemia, unspecified: Secondary | ICD-10-CM | POA: Diagnosis present

## 2022-03-24 DIAGNOSIS — E874 Mixed disorder of acid-base balance: Secondary | ICD-10-CM | POA: Diagnosis present

## 2022-03-24 DIAGNOSIS — E1110 Type 2 diabetes mellitus with ketoacidosis without coma: Secondary | ICD-10-CM | POA: Diagnosis not present

## 2022-03-24 DIAGNOSIS — E119 Type 2 diabetes mellitus without complications: Secondary | ICD-10-CM | POA: Diagnosis not present

## 2022-03-24 DIAGNOSIS — E876 Hypokalemia: Secondary | ICD-10-CM | POA: Diagnosis not present

## 2022-03-24 DIAGNOSIS — Z794 Long term (current) use of insulin: Secondary | ICD-10-CM | POA: Diagnosis not present

## 2022-03-24 DIAGNOSIS — Z833 Family history of diabetes mellitus: Secondary | ICD-10-CM | POA: Diagnosis not present

## 2022-03-24 DIAGNOSIS — Z7984 Long term (current) use of oral hypoglycemic drugs: Secondary | ICD-10-CM

## 2022-03-24 DIAGNOSIS — I2510 Atherosclerotic heart disease of native coronary artery without angina pectoris: Secondary | ICD-10-CM | POA: Diagnosis present

## 2022-03-24 DIAGNOSIS — D751 Secondary polycythemia: Secondary | ICD-10-CM | POA: Diagnosis present

## 2022-03-24 DIAGNOSIS — E860 Dehydration: Secondary | ICD-10-CM | POA: Diagnosis present

## 2022-03-24 DIAGNOSIS — I10 Essential (primary) hypertension: Secondary | ICD-10-CM | POA: Diagnosis present

## 2022-03-24 LAB — TSH: TSH: 2.513 u[IU]/mL (ref 0.350–4.500)

## 2022-03-24 LAB — CBG MONITORING, ED
Glucose-Capillary: 448 mg/dL — ABNORMAL HIGH (ref 70–99)
Glucose-Capillary: 373 mg/dL — ABNORMAL HIGH (ref 70–99)
Glucose-Capillary: 402 mg/dL — ABNORMAL HIGH (ref 70–99)
Glucose-Capillary: 338 mg/dL — ABNORMAL HIGH (ref 70–99)
Glucose-Capillary: 233 mg/dL — ABNORMAL HIGH (ref 70–99)
Glucose-Capillary: 270 mg/dL — ABNORMAL HIGH (ref 70–99)
Glucose-Capillary: 243 mg/dL — ABNORMAL HIGH (ref 70–99)
Glucose-Capillary: 209 mg/dL — ABNORMAL HIGH (ref 70–99)
Glucose-Capillary: 281 mg/dL — ABNORMAL HIGH (ref 70–99)
Glucose-Capillary: 210 mg/dL — ABNORMAL HIGH (ref 70–99)
Glucose-Capillary: 188 mg/dL — ABNORMAL HIGH (ref 70–99)
Glucose-Capillary: 313 mg/dL — ABNORMAL HIGH (ref 70–99)

## 2022-03-24 LAB — BASIC METABOLIC PANEL
Anion gap: 16 — ABNORMAL HIGH (ref 5–15)
Anion gap: 15 (ref 5–15)
Anion gap: 12 (ref 5–15)
Anion gap: 7 (ref 5–15)
Anion gap: 11 (ref 5–15)
BUN: 24 mg/dL — ABNORMAL HIGH (ref 8–23)
BUN: 25 mg/dL — ABNORMAL HIGH (ref 8–23)
BUN: 23 mg/dL (ref 8–23)
BUN: 20 mg/dL (ref 8–23)
BUN: 20 mg/dL (ref 8–23)
CO2: 21 mmol/L — ABNORMAL LOW (ref 22–32)
CO2: 22 mmol/L (ref 22–32)
CO2: 26 mmol/L (ref 22–32)
CO2: 29 mmol/L (ref 22–32)
CO2: 26 mmol/L (ref 22–32)
Calcium: 9.3 mg/dL (ref 8.9–10.3)
Calcium: 9.6 mg/dL (ref 8.9–10.3)
Calcium: 9.1 mg/dL (ref 8.9–10.3)
Calcium: 9.1 mg/dL (ref 8.9–10.3)
Calcium: 8.8 mg/dL — ABNORMAL LOW (ref 8.9–10.3)
Chloride: 100 mmol/L (ref 98–111)
Chloride: 103 mmol/L (ref 98–111)
Chloride: 102 mmol/L (ref 98–111)
Chloride: 104 mmol/L (ref 98–111)
Chloride: 99 mmol/L (ref 98–111)
Creatinine, Ser: 1.33 mg/dL — ABNORMAL HIGH (ref 0.61–1.24)
Creatinine, Ser: 1.31 mg/dL — ABNORMAL HIGH (ref 0.61–1.24)
Creatinine, Ser: 1.06 mg/dL (ref 0.61–1.24)
Creatinine, Ser: 0.99 mg/dL (ref 0.61–1.24)
Creatinine, Ser: 1.29 mg/dL — ABNORMAL HIGH (ref 0.61–1.24)
GFR, Estimated: 56 mL/min — ABNORMAL LOW (ref >60)
GFR, Estimated: 57 mL/min — ABNORMAL LOW (ref >60)
GFR, Estimated: >60 mL/min (ref >60)
GFR, Estimated: >60 mL/min (ref >60)
GFR, Estimated: 58 mL/min — ABNORMAL LOW (ref >60)
Glucose, Bld: 438 mg/dL — ABNORMAL HIGH (ref 70–99)
Glucose, Bld: 326 mg/dL — ABNORMAL HIGH (ref 70–99)
Glucose, Bld: 234 mg/dL — ABNORMAL HIGH (ref 70–99)
Glucose, Bld: 185 mg/dL — ABNORMAL HIGH (ref 70–99)
Glucose, Bld: 327 mg/dL — ABNORMAL HIGH (ref 70–99)
Potassium: 4.1 mmol/L (ref 3.5–5.1)
Potassium: 3.7 mmol/L (ref 3.5–5.1)
Potassium: 3.3 mmol/L — ABNORMAL LOW (ref 3.5–5.1)
Potassium: 3.3 mmol/L — ABNORMAL LOW (ref 3.5–5.1)
Potassium: 4.5 mmol/L (ref 3.5–5.1)
Sodium: 137 mmol/L (ref 135–145)
Sodium: 140 mmol/L (ref 135–145)
Sodium: 140 mmol/L (ref 135–145)
Sodium: 140 mmol/L (ref 135–145)
Sodium: 136 mmol/L (ref 135–145)

## 2022-03-24 LAB — BETA-HYDROXYBUTYRIC ACID: Beta-Hydroxybutyric Acid: 4.93 mmol/L — ABNORMAL HIGH (ref 0.05–0.27)

## 2022-03-24 MED ORDER — POTASSIUM CHLORIDE 10 MEQ/100ML IV SOLN
10.0000 meq | INTRAVENOUS | Status: AC
  Administered 2022-03-24 – 2022-03-25 (×5): 10 meq via INTRAVENOUS
  Filled 2022-03-24 (×5): qty 100

## 2022-03-24 MED ORDER — DEXTROSE IN LACTATED RINGERS 5 % IV SOLN: INTRAVENOUS | Status: DC

## 2022-03-24 MED ORDER — METOPROLOL SUCCINATE ER 25 MG PO TB24
25.0000 mg | ORAL_TABLET | Freq: Every day | ORAL | Status: DC
  Administered 2022-03-24 – 2022-03-26 (×3): 25 mg via ORAL
  Filled 2022-03-24 (×3): qty 1

## 2022-03-24 MED ORDER — ROSUVASTATIN CALCIUM 20 MG PO TABS
40.0000 mg | ORAL_TABLET | Freq: Every day | ORAL | Status: DC
  Administered 2022-03-24 – 2022-03-26 (×3): 40 mg via ORAL
  Filled 2022-03-24 (×3): qty 2

## 2022-03-24 MED ORDER — POTASSIUM CHLORIDE CRYS ER 20 MEQ PO TBCR
40.0000 meq | EXTENDED_RELEASE_TABLET | Freq: Once | ORAL | Status: AC
  Administered 2022-03-24: 40 meq via ORAL
  Filled 2022-03-24: qty 2

## 2022-03-24 MED ORDER — EZETIMIBE 10 MG PO TABS
10.0000 mg | ORAL_TABLET | Freq: Every day | ORAL | Status: DC
  Administered 2022-03-24 – 2022-03-26 (×3): 10 mg via ORAL
  Filled 2022-03-24 (×3): qty 1

## 2022-03-24 MED ORDER — LACTATED RINGERS IV BOLUS
20.0000 mL/kg | Freq: Once | INTRAVENOUS | Status: AC
  Administered 2022-03-24: 1632 mL via INTRAVENOUS

## 2022-03-24 MED ORDER — ENOXAPARIN SODIUM 40 MG/0.4ML IJ SOSY
40.0000 mg | PREFILLED_SYRINGE | INTRAMUSCULAR | Status: DC
  Administered 2022-03-24 – 2022-03-26 (×3): 40 mg via SUBCUTANEOUS
  Filled 2022-03-24 (×3): qty 0.4

## 2022-03-24 MED ORDER — AMLODIPINE BESYLATE 5 MG PO TABS
5.0000 mg | ORAL_TABLET | Freq: Every day | ORAL | Status: DC
  Administered 2022-03-24 – 2022-03-26 (×3): 5 mg via ORAL
  Filled 2022-03-24 (×3): qty 1

## 2022-03-24 MED ORDER — INSULIN GLARGINE-YFGN 100 UNIT/ML ~~LOC~~ SOLN
40.0000 [IU] | SUBCUTANEOUS | Status: DC
  Administered 2022-03-24: 40 [IU] via SUBCUTANEOUS
  Filled 2022-03-24 (×2): qty 0.4

## 2022-03-24 MED ORDER — INSULIN ASPART 100 UNIT/ML IJ SOLN
0.0000 [IU] | Freq: Three times a day (TID) | INTRAMUSCULAR | Status: DC
  Administered 2022-03-25: 11 [IU] via SUBCUTANEOUS
  Administered 2022-03-25: 8 [IU] via SUBCUTANEOUS

## 2022-03-24 MED ORDER — ASPIRIN 81 MG PO TBEC
81.0000 mg | DELAYED_RELEASE_TABLET | Freq: Every day | ORAL | Status: DC
  Administered 2022-03-24 – 2022-03-26 (×3): 81 mg via ORAL
  Filled 2022-03-24 (×3): qty 1

## 2022-03-24 MED ORDER — LACTATED RINGERS IV SOLN: INTRAVENOUS | Status: DC

## 2022-03-24 MED ORDER — DEXTROSE 50 % IV SOLN: 0.0000 mL | INTRAVENOUS | Status: DC | PRN

## 2022-03-24 MED ORDER — INSULIN ASPART 100 UNIT/ML IJ SOLN
0.0000 [IU] | Freq: Every day | INTRAMUSCULAR | Status: DC
  Administered 2022-03-24: 4 [IU] via SUBCUTANEOUS
  Administered 2022-03-25: 2 [IU] via SUBCUTANEOUS

## 2022-03-24 MED ORDER — INSULIN REGULAR(HUMAN) IN NACL 100-0.9 UT/100ML-% IV SOLN
INTRAVENOUS | Status: DC
  Administered 2022-03-24: 5.5 [IU]/h via INTRAVENOUS
  Filled 2022-03-24: qty 100

## 2022-03-24 MED ORDER — HYDROCHLOROTHIAZIDE 25 MG PO TABS
25.0000 mg | ORAL_TABLET | Freq: Every day | ORAL | Status: DC
  Administered 2022-03-24 – 2022-03-26 (×3): 25 mg via ORAL
  Filled 2022-03-24 (×3): qty 1

## 2022-03-24 MED ORDER — POTASSIUM CHLORIDE 10 MEQ/100ML IV SOLN
10.0000 meq | INTRAVENOUS | Status: AC
  Administered 2022-03-24 (×2): 10 meq via INTRAVENOUS
  Filled 2022-03-24 (×2): qty 100

## 2022-03-24 NOTE — H&P (Signed)
Date: 03/24/2022               Patient Name:  Alec Jacobson MRN: [id number]  DOB: 09-13-1945 Age / Sex: 76 y.o., male   PCP: Vincente Liberty, MD         Medical Service: Internal Medicine Teaching Service         Attending Physician: Dr. Lottie Mussel, MD    First Contact: Leigh Aurora, DO      Pager: [phone]      Second Contact: Idamae Schuller, MD     Pager: [place]        After Hours (After 5p/  First Contact Pager: [phone]  weekends / holidays): Second Contact Pager: [phone]   SUBJECTIVE   Chief Complaint: Fatigue   History of Present Illness: This is a 76 year old male with a PMHx of HTN, T2DM with long term insulin use, CAD and HLD who presented to the emergency room after measuring his blood glucose at home and it being very elevated. Patient notes that for the past 2 weeks he noticed that he noticed polydipsia and polyuria. He also reports that for the last 2 weeks, he has been having a dry mouth.  He states since, he has felt more fatigued, and most recently started having lightheaded episodes when moving.  He describes it as "taking 2 steps forward, 1 step back."  He states that he has not fallen, but notes he has been lightheaded.  He states over the past 2 weeks, he has also had decreased appetite.  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  He reports the taste is changed in his mouth.  He denies any nausea, vomiting, diarrhea, abdominal pain.  He states today, his wife noticed he is just not been looking good, and reports that she went to the pharmacy to get a test to measure his blood sugars.  Blood sugars were high x 2, which prompted his ED visit.  Of note, patient reports not checking his sugars at home.  Patient does report compliance with his medications, and reports taking 65 units of Lantus daily as well as metformin 1000 mg daily.  He states that over the past few weeks, he has not been eating  well either, stating that he eats unhealthy.  He denies any sick contacts.  He denies any shortness of breath, cough, chest pain, or any other concerns.  His only concern today is dry mouth, lightheadedness, polyuria, polydipsia, and fatigue.  ED Course: Patient initially came into the emergency room, with stable vital signs.  Under for vital signs showing 98.4 F, pulse of 97, respiratory rate 20, blood pressure 142/94, satting at 98% on room air.  Initial labs showing CBG of 566.  Initial BMP showed elevated creatinine at 1.39 and elevated anion gap of 16.  Elevated hemoglobin of 17.4.  UA showing greater than 500 glucose with ketones noted.  Initial VBG showing pH of 7.27, and pCO2 54.  Beta hydroxybutyrate acid level 4.93.  Patient was started on endotool and IMTS consulted for further evaluation and management of DKA.   Meds:  Amlodipine 5 mg daily Aspirin 81 mg daily Ezetimibe 10 mg daily HCTZ 25 mg daily Lantus 65 units every morning Metformin 1000 mg daily Metoprolol succinate 25 mg daily  Nitroglycerin as needed Rosuvastatin 40 mg daily   Past Medical History  Past Surgical History:  Procedure Laterality Date   NASAL SINUS  SURGERY     SINUS ENDO WITH FUSION Bilateral 04/13/2014   Procedure: SINUS ENDO WITH FUSION;  Surgeon: Ruby Cola, MD;  Location: [facility] OR;  Service: ENT;  Laterality: Bilateral;   TONSILLECTOMY      Social:  Lives With: Wife Occupation: N/A Support: Great support at home Level of Function: Independent in all ADLs and IADLs PCP: Dr. Katherine Roan Substances: No substance use  Family History:  Maternal grandmother: Diabetes Mother: Hypertension Father: Diabetes  Allergies: Allergies as of 03/23/2022   (No Known Allergies)    Review of Systems: A complete ROS was negative except as per HPI.   OBJECTIVE:   Physical Exam: Blood pressure (!) 141/96, pulse (!) 108, temperature 97.7 F (36.5 C), resp. rate 17, height '5\' 9"'$  (1.753 m), weight 81.6  kg, SpO2 96 %.  Constitutional: Well-appearing, resting in bed in no acute distress HENT: Dry mucous membranes noted, normocephalic, atraumatic Eyes: conjunctiva non-erythematous Cardiovascular: regular rate and rhythm, no m/r/g Pulmonary/Chest: normal work of breathing on room air, lungs clear to auscultation bilaterally Abdominal: Slightly distended, nontender, and soft MSK: normal bulk and tone Extremities: 2+ pedal pulses, with no wounds noted to bilateral lower extremities  Labs: CBC    Component Value Date/Time   WBC 7.1 03/23/2022 2157   RBC 6.10 (H) 03/23/2022 2157   HGB 17.7 (H) 03/23/2022 2252   HCT 52.0 03/23/2022 2252   PLT 205 03/23/2022 2157   MCV 86.1 03/23/2022 2157   MCV 79.7 (A) 05/14/2015 1026   MCH 28.5 03/23/2022 2157   MCHC 33.1 03/23/2022 2157   RDW 13.5 03/23/2022 2157   LYMPHSABS 1.1 04/24/2015 1838   MONOABS 0.7 04/24/2015 1838   EOSABS 0.0 04/24/2015 1838   BASOSABS 0.1 04/24/2015 1838     CMP     Component Value Date/Time   NA 137 03/24/2022 0653   NA 142 01/10/2021 1405   K 4.1 03/24/2022 0653   CL 100 03/24/2022 0653   CO2 21 (L) 03/24/2022 0653   GLUCOSE 438 (H) 03/24/2022 0653   BUN 24 (H) 03/24/2022 0653   BUN 19 01/10/2021 1405   CREATININE 1.33 (H) 03/24/2022 0653   CALCIUM 9.3 03/24/2022 0653   PROT 7.2 04/22/2015 1017   ALBUMIN 3.4 (L) 04/22/2015 1017   AST 19 04/22/2015 1017   ALT 20 04/19/2021 0953   ALKPHOS 141 (H) 04/22/2015 1017   BILITOT 1.0 04/22/2015 1017   GFRNONAA 56 (L) 03/24/2022 0653   GFRAA >60 04/26/2015 0856    Imaging: N/A  EKG: personally reviewed my interpretation is normal sinus rhythm with a rate of 72 with no ST segment changes noted.  Unchanged from previous ECG.  ASSESSMENT & PLAN:   Assessment & Plan by Problem: Principal Problem:   Diabetic ketoacidosis ([place])   Alec Jacobson is a 76 y.o. male with a past medical history of type 2 diabetes, hypertension, CAD, hyperlipidemia who presented to  the emergency department with concerns of fatigue.  Patient found to be in mild DKA, and patient admitted for further evaluation management.  #Anion gap metabolic acidosis with concomitant respiratory acidosis #Diabetic ketoacidosis #Type 2 diabetes mellitus Patient presented to the emergency department with concerns of fatigue.  Patient reported 2-week history of polydipsia, polyuria, decreased appetite, unhealthy eating, dry mouth and lightheadedness.  He denies any sick contacts.  He reports that yesterday, his wife that he was not looking well, and decided to check his glucose levels.  Patient does report compliance with all of his medications,  in which she takes metformin 1000 mg daily, and Lantus 65 units daily. Patient had very high glucose levels, which prompted his ED visit.  In emergency room, patient had elevated anion gap, decreased bicarb at 21, with very elevated CBGs into the 500s.  Patient also had elevated beta hydroxybutyrate acid levels. Unclear on insult in which led him into DKA.  This could be due to dehydration which caused him to go into DKA.  This is mild DKA, and will not require ICU admission.  Patient denies any sick contacts, cough, shortness of breath, or any bowel symptoms suggestive of infection.  Lightheadedness can be explained by dehydration in the setting of polydipsia and polyuria.  Respiratory acidosis unclear, as patient was tachypneic, but will continue to follow.  Could be falsely elevated due to VBG.  Will plan to admit and continue on Endo tool. Will work up fatigue with TSH, but less likely hypothyroidism.  -Continue Endo tool -Continue CBG monitoring -Monitor BMP q4h until gap closed x2  -Gap closed x 2, transition to subcutaneous insulin, with 2-hour overlap time with endotool.  -Replace potassium as needed -Monitor tele -Maintenance fluids  -Hold home metformin -TSH pending  #Prerenal azotemia versus CKD3A Patient presented with elevated creatinine,  likely secondary in the setting of dehydration secondary to polyuria.  Trending creatinine showing elevated creatinine with baseline around 1.0-1.2.  Unclear if patient has CKD or not.  Patient does not meet AKI criteria at this moment.  This is likely dehydration.  Anticipate prerenal, and fluid should decrease creatinine back to baseline. -Continue LR -Repeat BMP pending  #Polycythemia likely in the setting of hemoconcentration  Patient had elevated hemoglobin in the emergency department at 17.4. Patient normally is around 82-14. Patient is not a smoker and there is not evidence of testosterone use. Patient hemoglobin likely elevated in the setting of dehydration. I anticipate that the hemoglobin will decrease after fluids. -Maintenance fluids  -Repeat CBC   #CAD  #HLD Patient has a history fo CAD.  Patient has no concerns about chest pain today.  No evidence of any acute changes to his ECG. -Continue home metoprolol 25 mg daily -Continue home aspirin 81 mg daily -Continue home rosuvastatin 40 mg daily -Continue home Zetia 10 mg daily  #HTN Patient has past medical history of hypertension.  Patient is on HCTZ 25 mg at home.  Patient also on amlodipine 5 mg daily at home.  Blood pressure currently measuring in the 944H systolic. -Resume home pain -Resume home HCTZ  Diet: NPO VTE: Enoxaparin IVF: LR,125cc/hr Code: Full  Prior to Admission Living Arrangement: Home, living with wife Anticipated Discharge Location: Home Barriers to Discharge: Clinical improvement  Dispo: Admit patient to Inpatient with expected length of stay greater than 2 midnights.  Signed: Leigh Aurora, [place] Internal Medicine Resident PGY-1 [phone] 03/24/2022, 9:19 AM

## 2022-03-24 NOTE — ED Notes (Signed)
Insulin drip stopped at this time

## 2022-03-24 NOTE — Hospital Course (Signed)
Alec Jacobson is a 76 y.o. male with a past medical history of type 2 diabetes, hypertension, CAD, hyperlipidemia who presented to the emergency department with concerns of fatigue.  Patient found to be in mild DKA, and patient admitted for further evaluation management.   #Anion gap metabolic acidosis with concomitant respiratory acidosis #Diabetic ketoacidosis #Type 2 diabetes mellitus Patient was in DKA, required insulin drip, and was transitioned off.  Blood sugars have been uncontrolled with sugars in the 200s and 300s.  Patient was transitioned off Endo tool and started on 40 unitsd Semglee.  Home dose is 65 units Lantus.  Current hospitalization, patient transition to 35 units of Lantus in the morning, and 35 units of Lantus in the evening.  Plan to continue to titrate insulin as needed.  Anticipate discharge tomorrow.  Patient likely was in DKA after holiday morning.  #Prerenal azotemia  Patient initially presented with elevated creatinine.  This was likely secondary to dehydration in the setting of polyuria.  With fluids, creatinine trended down, but then did trend up.  Patient likely dehydrated, and will need more fluids.     #Polycythemia likely in the setting of hemoconcentration  Patient had elevated hemoglobin at 17 on arrival.  After fluids, trended down.  No concerns.   #CAD  #HLD Patient remained on home meds of metoprolol succinate 25 mg daily, aspirin 81 mg daily, rosuvastatin 40 mg daily, Zetia 10 mg daily.   #HTN Blood pressure is measuring well.  Resumed home amlodipine and HCTZ.    1.2.24: No light headedness, dizzininess, has no complaints. Urinary symptoms have improved. Feeling ok, wants to leave. Increased dose of insulin to 70U total, pt expresses understanding. Sees endocrinologist every 3 months. Dr. Katherine Roan PCP, motivated to make an appointment.

## 2022-03-24 NOTE — ED Provider Notes (Signed)
[facility] EMERGENCY DEPARTMENT Provider Note   CSN: [id number] Arrival date & time: 03/23/22  2020     History  Chief Complaint  Patient presents with   Hyperglycemia    Alec Jacobson is a 76 y.o. male.   Hyperglycemia Associated symptoms: increased thirst, polyuria and weakness   Associated symptoms: no fever and no vomiting    Patient with history of diabetes presents with concern for hyperglycemia.  Wife reports over the past month he has had increasing fatigue.  Recently he has been feeling very thirsty and having polyuria.  Wife reports that last time he acted like this, he had significantly high blood glucose.  He has been compliant with his medications. He denies any fevers or vomiting.  No diarrhea.  No chest pain or shortness of breath.  He has no headache. He does report increased fatigue    Home Medications Prior to Admission medications   Medication Sig Start Date End Date Taking? Authorizing Provider  amLODipine (NORVASC) 5 MG tablet Take 1 tablet (5 mg total) by mouth daily. 04/26/15   Orson Eva, MD  aspirin EC 81 MG tablet Take 81 mg by mouth daily. Swallow whole.    [provider]  blood glucose meter kit and supplies Dispense based on patient and insurance preference. Use up to four times daily as directed. (FOR ICD-9 250.00, 250.01). 04/26/15   Tat, Shanon Brow, MD  ezetimibe (ZETIA) 10 MG tablet Take 1 tablet (10 mg total) by mouth daily. 06/07/21   Chandrasekhar, Lyda Kalata A, MD  hydrochlorothiazide (HYDRODIURIL) 25 MG tablet Take 25 mg by mouth daily. 01/02/21   [provider]  insulin glargine (LANTUS SOLOSTAR) 100 UNIT/ML Solostar Pen Inject 65 Units into the skin every morning. And pen needles 1/day 11/19/21   Shamleffer, Melanie Crazier, MD  Insulin Pen Needle (BD PEN NEEDLE NANO U/F) 32G X 4 MM MISC USE WITH INSULIN PENS TO  DISPENSE INSULIN DAILY AS DIRECTED 11/28/21   Shamleffer, Melanie Crazier, MD  metFORMIN (GLUCOPHAGE-XR)  500 MG 24 hr tablet Take 2 tablets (1,000 mg total) by mouth daily with breakfast. 11/19/21   Shamleffer, Melanie Crazier, MD  metoprolol succinate (TOPROL-XL) 25 MG 24 hr tablet TAKE 1 TABLET BY MOUTH DAILY 03/20/22   Chandrasekhar, Mahesh A, MD  nitroGLYCERIN (NITROSTAT) 0.4 MG SL tablet Place 1 tablet (0.4 mg total) under the tongue every 5 (five) minutes as needed for chest pain (Take this medicine by mouth as needed. At the first sign of chest pain or tightness place one tablet under your tongue. Let the tablet dissolve under the tongue. Do not swallow whole.  Do not eat or drink, smoke while a tablet is dissolving. If you are not better within 5 minutes after taking ONE dose of nitroglycerin, you may take a SECOND dose. IF YOU ARE GETTING TO THE 3RD DOSE CALL 911.  Do not take more than 3 nitroglycerin tablet.). 01/22/21   Chandrasekhar, Lyda Kalata A, MD  rosuvastatin (CRESTOR) 40 MG tablet TAKE 1 TABLET BY MOUTH EVERY DAY 03/20/22   Werner Lean, MD      Allergies    Patient has no known allergies.    Review of Systems   Review of Systems  Constitutional:  Negative for fever.  Gastrointestinal:  Negative for vomiting.  Endocrine: Positive for polydipsia and polyuria.  Neurological:  Positive for weakness.    Physical Exam Updated Vital Signs BP (!) 141/96   Pulse (!) 108   Temp 97.7 F (  36.5 C)   Resp 17   Ht 1.753 m (_0 )   Wt 81.6 kg   SpO2 96%   BMI 26.58 kg/m  Physical Exam CONSTITUTIONAL: Well developed/well nourished, no distress, does not smell of ketones HEAD: Normocephalic/atraumatic EYES: EOMI ENMT: Mucous membranes moist NECK: supple no meningeal signs CV: S1/S2 noted, no murmurs/rubs/gallops noted LUNGS: Lungs are clear to auscultation bilaterally, no apparent distress ABDOMEN: soft, nontender NEURO: Pt is awake/alert/appropriate, moves all extremitiesx4.  No facial droop.  No arm or leg drift EXTREMITIES: pulses normal/equal, full ROM SKIN: warm,  color normal PSYCH: no abnormalities of mood noted, alert and oriented to situation  ED Results / Procedures / Treatments   Labs (all labs ordered are listed, but only abnormal results are displayed) Labs Reviewed  BASIC METABOLIC PANEL - Abnormal; Notable for the following components:      Result Value   Glucose, Bld 584 (*)    BUN 24 (*)    Creatinine, Ser 1.39 (*)    GFR, Estimated 53 (*)    Anion gap 16 (*)    All other components within normal limits  CBC - Abnormal; Notable for the following components:   RBC 6.10 (*)    Hemoglobin 17.4 (*)    HCT 52.5 (*)    All other components within normal limits  URINALYSIS, ROUTINE W REFLEX MICROSCOPIC - Abnormal; Notable for the following components:   Glucose, UA >=500 (*)    Hgb urine dipstick SMALL (*)    Ketones, ur 20 (*)    Protein, ur 100 (*)    All other components within normal limits  BETA-HYDROXYBUTYRIC ACID - Abnormal; Notable for the following components:   Beta-Hydroxybutyric Acid 4.93 (*)    All other components within normal limits  CBG MONITORING, ED - Abnormal; Notable for the following components:   Glucose-Capillary 566 (*)    All other components within normal limits  CBG MONITORING, ED - Abnormal; Notable for the following components:   Glucose-Capillary 448 (*)    All other components within normal limits  I-STAT VENOUS BLOOD GAS, ED - Abnormal; Notable for the following components:   Acid-base deficit 3.0 (*)    HCT 53.0 (*)    Hemoglobin 18.0 (*)    All other components within normal limits  I-STAT VENOUS BLOOD GAS, ED - Abnormal; Notable for the following components:   Acid-base deficit 3.0 (*)    Hemoglobin 17.7 (*)    All other components within normal limits  CBG MONITORING, ED - Abnormal; Notable for the following components:   Glucose-Capillary 373 (*)    All other components within normal limits  BASIC METABOLIC PANEL  CBG MONITORING, ED    EKG EKG Interpretation  Date/Time:  Sunday  March 24 2022 05:58:24 EST Ventricular Rate:  76 PR Interval:    QRS Duration: 98 QT Interval:  416 QTC Calculation: 468 R Axis:   -76 Text Interpretation: Sinus rhythm Inferior infarct, old Probable anteroseptal infarct, old Interpretation limited secondary to artifact Confirmed by Ripley Fraise [phone]) on 03/24/2022 6:25:15 AM  Radiology No results found.  Procedures Procedures    Medications Ordered in ED Medications  insulin regular, human (MYXREDLIN) 100 units/ 100 mL infusion (5.5 Units/hr Intravenous New Bag/Given 03/24/22 0639)  lactated ringers infusion (has no administration in time range)  dextrose 5 % in lactated ringers infusion (has no administration in time range)  dextrose 50 % solution 0-50 mL (has no administration in time range)  lactated ringers bolus  1,632 mL (0 mLs Intravenous Stopped 03/24/22 3716)    ED Course/ Medical Decision Making/ A&P Clinical Course as of 03/24/22 0719  Sun Mar 24, 2022  0402 Glucose(!!): 584 Hyperglycemia [DW]  0402 BUN(!): 24 Dehydration [DW]  0437 Patient presents with hyperglycemia and fatigue.  Patient has early signs of DKA.  He is in no acute distress and not vomiting.  Plan to start IV fluids and insulin. [DW]  [phone] Signed out to Dr. Jeanell Sparrow at shift change to follow-up and bmp.  If no improvement, patient need to be admitted [DW]    Clinical Course User Index [DW] Ripley Fraise, MD                           Medical Decision Making Amount and/or Complexity of Data Reviewed Labs: ordered. Decision-making details documented in ED Course. ECG/medicine tests: ordered.  Risk Prescription drug management.   This patient presents to the ED for concern of hyperglycemia, this involves an extensive number of treatment options, and is a complaint that carries with it a high risk of complications and morbidity.  The differential diagnosis includes but is not limited to diabetic ketoacidosis, hyperglycemia, hyperglycemic  hyperosmolar state  Comorbidities that complicate the patient evaluation: Patient's presentation is complicated by their history of diabetes, hypertension   Additional history obtained: Additional history obtained from spouse Records reviewed previous admission documents  Lab Tests: I Ordered, and personally interpreted labs.  The pertinent results include: Hyperglycemia, dehydration  Cardiac Monitoring: The patient was maintained on a cardiac monitor.  I personally viewed and interpreted the cardiac monitor which showed an underlying rhythm of:  sinus rhythm  Medicines ordered and prescription drug management: I ordered medication including IV fluids and insulin for hyperglycemia Reevaluation of the patient after these medicines showed that the patient    stayed the same   Critical Interventions:   IV fluids and insulin   Reevaluation: After the interventions noted above, I reevaluated the patient and found that they have :stayed the same  Complexity of problems addressed: Patient's presentation is most consistent with  severe exacerbation of chronic illness          Final Clinical Impression(s) / ED Diagnoses Final diagnoses:  Hyperglycemia  Dehydration    Rx / DC Orders ED Discharge Orders     None         Ripley Fraise, MD 03/24/22 [phone]

## 2022-03-24 NOTE — ED Provider Notes (Signed)
  Physical Exam  BP (!) 141/96   Pulse (!) 108   Temp 97.7 F (36.5 C)   Resp 17   Ht 1.753 m ('5\' 9"'$ )   Wt 81.6 kg   SpO2 96%   BMI 26.58 kg/m   Physical Exam  Procedures  Procedures  ED Course / MDM   Clinical Course as of 03/24/22 0724  Sun Mar 24, 2022  0402 Glucose(!!): 584 Hyperglycemia [DW]  0402 BUN(!): 24 Dehydration [DW]  0437 Patient presents with hyperglycemia and fatigue.  Patient has early signs of DKA.  He is in no acute distress and not vomiting.  Plan to start IV fluids and insulin. [DW]  [phone] Signed out to Dr. Jeanell Sparrow at shift change to follow-up and bmp.  If no improvement, patient need to be admitted [DW]    Clinical Course User Index [DW] Ripley Fraise, MD   Medical Decision Making Amount and/or Complexity of Data Reviewed Labs: ordered. Decision-making details documented in ED Course. ECG/medicine tests: ordered.  Risk Prescription drug management.   76 yo male ho dm presented with hyperglycemia, patient with mild dka. Started on iv fluids and insulin Anion gap 16 Discussed with Dr. Humphrey Rolls on for IM teaching service- will see for admission        Pattricia Boss, MD 03/24/22 [phone]

## 2022-03-24 NOTE — ED Notes (Signed)
Endotool instructed this Probation officer to discontinue the insulin drip. Reached out to providers for clarification and instructed to continue EndoTool.

## 2022-03-25 DIAGNOSIS — E119 Type 2 diabetes mellitus without complications: Secondary | ICD-10-CM

## 2022-03-25 DIAGNOSIS — Z794 Long term (current) use of insulin: Secondary | ICD-10-CM

## 2022-03-25 DIAGNOSIS — E860 Dehydration: Secondary | ICD-10-CM

## 2022-03-25 LAB — MRSA NEXT GEN BY PCR, NASAL: MRSA by PCR Next Gen: NOT DETECTED

## 2022-03-25 LAB — CBC WITH DIFFERENTIAL/PLATELET
Abs Immature Granulocytes: 0.04 10*3/uL (ref 0.00–0.07)
Basophils Absolute: 0 10*3/uL (ref 0.0–0.1)
Basophils Relative: 1 %
Eosinophils Absolute: 0.2 10*3/uL (ref 0.0–0.5)
Eosinophils Relative: 4 %
HCT: 43.5 % (ref 39.0–52.0)
Hemoglobin: 14.4 g/dL (ref 13.0–17.0)
Immature Granulocytes: 1 %
Lymphocytes Relative: 12 %
Lymphs Abs: 0.6 10*3/uL — ABNORMAL LOW (ref 0.7–4.0)
MCH: 28.3 pg (ref 26.0–34.0)
MCHC: 33.1 g/dL (ref 30.0–36.0)
MCV: 85.6 fL (ref 80.0–100.0)
Monocytes Absolute: 0.5 10*3/uL (ref 0.1–1.0)
Monocytes Relative: 10 %
Neutro Abs: 3.8 10*3/uL (ref 1.7–7.7)
Neutrophils Relative %: 72 %
Platelets: 163 10*3/uL (ref 150–400)
RBC: 5.08 MIL/uL (ref 4.22–5.81)
RDW: 13.3 % (ref 11.5–15.5)
WBC: 5.3 10*3/uL (ref 4.0–10.5)
nRBC: 0 % (ref 0.0–0.2)

## 2022-03-25 LAB — GLUCOSE, CAPILLARY
Glucose-Capillary: 253 mg/dL — ABNORMAL HIGH (ref 70–99)
Glucose-Capillary: 311 mg/dL — ABNORMAL HIGH (ref 70–99)
Glucose-Capillary: 173 mg/dL — ABNORMAL HIGH (ref 70–99)
Glucose-Capillary: 234 mg/dL — ABNORMAL HIGH (ref 70–99)

## 2022-03-25 MED ORDER — LACTATED RINGERS IV BOLUS
1000.0000 mL | Freq: Once | INTRAVENOUS | Status: AC
  Administered 2022-03-25: 1000 mL via INTRAVENOUS

## 2022-03-25 MED ORDER — POTASSIUM CHLORIDE 10 MEQ/100ML IV SOLN
INTRAVENOUS | Status: AC
  Administered 2022-03-25: 10 meq via INTRAVENOUS
  Filled 2022-03-25: qty 100

## 2022-03-25 MED ORDER — INSULIN GLARGINE-YFGN 100 UNIT/ML ~~LOC~~ SOLN
35.0000 [IU] | Freq: Every day | SUBCUTANEOUS | Status: DC
  Administered 2022-03-25 – 2022-03-26 (×2): 35 [IU] via SUBCUTANEOUS
  Filled 2022-03-25 (×2): qty 0.35

## 2022-03-25 MED ORDER — INSULIN ASPART 100 UNIT/ML IJ SOLN
0.0000 [IU] | Freq: Three times a day (TID) | INTRAMUSCULAR | Status: DC
  Administered 2022-03-25 – 2022-03-26 (×2): 4 [IU] via SUBCUTANEOUS
  Administered 2022-03-26: 11 [IU] via SUBCUTANEOUS

## 2022-03-25 MED ORDER — INSULIN GLARGINE-YFGN 100 UNIT/ML ~~LOC~~ SOLN
30.0000 [IU] | Freq: Every day | SUBCUTANEOUS | Status: DC
  Filled 2022-03-25: qty 0.3

## 2022-03-25 MED ORDER — INSULIN GLARGINE-YFGN 100 UNIT/ML ~~LOC~~ SOLN
35.0000 [IU] | Freq: Every day | SUBCUTANEOUS | Status: DC
  Administered 2022-03-25: 35 [IU] via SUBCUTANEOUS
  Filled 2022-03-25 (×3): qty 0.35

## 2022-03-25 NOTE — Evaluation (Signed)
Occupational Therapy Evaluation Patient Details Name: Alec Jacobson MRN: [id number] DOB: [birth year] Today's Date: 03/25/2022   History of Present Illness Pt is a 77 y/o M presenting to ED on 12/30 with hypoglycemia and fatigue, admitted for DKA. PMH includes HTN, DM2 with long term insulin use, CAD, and HLD.   Clinical Impression   Pt lives with spouse in 1 level home, typically is independent with ADLs and functional mobility. Pt currently at baseline, completing shower transfer, toilet transfer, and LB dressing with supervision, mod I for bed mobility and ambulation in room. VSS on RA throughout session. Pt presenting with impairments listed below, however has no acute OT needs at this time, will s/o. Please reconsult if there is a change in pt status. Anticipate not OT follow up needs at d/c.      Recommendations for follow up therapy are one component of a multi-disciplinary discharge planning process, led by the attending physician.  Recommendations may be updated based on patient status, additional functional criteria and insurance authorization.   Follow Up Recommendations  No OT follow up     Assistance Recommended at Discharge PRN  Patient can return home with the following Direct supervision/assist for medications management;Assist for transportation;Assistance with cooking/housework    Functional Status Assessment  Patient has had a recent decline in their functional status and demonstrates the ability to make significant improvements in function in a reasonable and predictable amount of time.  Equipment Recommendations  None recommended by OT    Recommendations for Other Services       Precautions / Restrictions Precautions Precautions: None Restrictions Weight Bearing Restrictions: No      Mobility Bed Mobility Overal bed mobility: Modified Independent                  Transfers Overall transfer level: Modified independent                         Balance Overall balance assessment: Mild deficits observed, not formally tested                                         ADL either performed or assessed with clinical judgement   ADL Overall ADL's : At baseline                                       General ADL Comments: performs LB dressing, shower transfer, and toilet transfer without difficulty, reports has been mobilizing in room and feels at baseline     Vision   Vision Assessment?: No apparent visual deficits     Perception Perception Perception Tested?: No   Praxis Praxis Praxis tested?: Not tested    Pertinent Vitals/Pain Pain Assessment Pain Assessment: No/denies pain     Hand Dominance Right   Extremity/Trunk Assessment Upper Extremity Assessment Upper Extremity Assessment: Overall WFL for tasks assessed   Lower Extremity Assessment Lower Extremity Assessment: Overall WFL for tasks assessed   Cervical / Trunk Assessment Cervical / Trunk Assessment: Normal   Communication Communication Communication: No difficulties   Cognition Arousal/Alertness: Awake/alert Behavior During Therapy: WFL for tasks assessed/performed Overall Cognitive Status: Within Functional Limits for tasks assessed  General Comments: pt sleeping upon arrival, initially lethargic, becoming more alert once awakened, provides PLOF accurately and follows commands     General Comments  VSS on RA    Exercises     Shoulder Instructions      Home Living Family/patient expects to be discharged to:: Private residence Living Arrangements: Spouse/significant other Available Help at Discharge: Family;Available PRN/intermittently Type of Home: House Home Access: Stairs to enter [employer] of Steps: 2   Home Layout: One level     Bathroom Shower/Tub: Walk-in shower;Tub/shower unit         Home Equipment: None          Prior  Functioning/Environment Prior Level of Function : Independent/Modified Independent;Driving;Working/employed             Mobility Comments: no AD ADLs Comments: ind        OT Problem List:        OT Treatment/Interventions:      OT Goals(Current goals can be found in the care plan section) Acute Rehab OT Goals Patient Stated Goal: none stated OT Goal Formulation: With patient Time For Goal Achievement: 04/08/22 Potential to Achieve Goals: Good  OT Frequency:      Co-evaluation              AM-PAC OT "6 Clicks" Daily Activity     Outcome Measure Help from another person eating meals?: None Help from another person taking care of personal grooming?: None Help from another person toileting, which includes using toliet, bedpan, or urinal?: None Help from another person bathing (including washing, rinsing, drying)?: A Little Help from another person to put on and taking off regular upper body clothing?: None Help from another person to put on and taking off regular lower body clothing?: None 6 Click Score: 23   End of Session Nurse Communication: Mobility status;Other (comment) (confirmed pt can have ice chips)  Activity Tolerance: Patient tolerated treatment well Patient left: in bed;with call bell/phone within reach;with bed alarm set  OT Visit Diagnosis: Unsteadiness on feet (R26.81);Other abnormalities of gait and mobility (R26.89);Muscle weakness (generalized) (M62.81)                Time: 3094-0768 OT Time Calculation (min): 16 min Charges:  OT General Charges $OT Visit: 1 Visit OT Evaluation $OT Eval Low Complexity: 1 Low  Whitni Pasquini K, OTD, OTR/L SecureChat Preferred Acute Rehab [phone]  Renaye Rakers Koonce 03/25/2022, 9:49 AM

## 2022-03-25 NOTE — TOC Progression Note (Signed)
Transition of Care [facility]) - Progression Note    Patient Details  Name: Alec Jacobson MRN: [id number] Date of Birth: 1945-06-19  Transition of Care [facility]) CM/SW Contact  Zenon Mayo, RN Phone Number: 03/25/2022, 9:51 AM  Clinical Narrative:    from home with DKA.  TOC following.        Expected Discharge Plan and Services                                               Social Determinants of Health (SDOH) Interventions SDOH Screenings   Depression (PHQ2-9): Low Risk  (07/20/2019)  Tobacco Use: Low Risk  (03/23/2022)    Readmission Risk Interventions     No data to display

## 2022-03-25 NOTE — Progress Notes (Signed)
HD#1 Subjective:   Summary: This is a 77 year old male with past medical history of type 2 diabetes, hypertension, CAD who presents emergency department with concerns of fatigue.  Patient found to be in mild DKA.  Patient admitted for further evaluation and management of DKA.  Overnight Events: Patient transitioned off Endotool  Patient reports he is doing well.  He has no concerns this morning.  He reports he is eating and drinking well.  He denies any nausea, vomiting, vision changes, chest pain, or shortness of breath.  He states he was up and moving with no concerns.  Objective:  Vital signs in last 24 hours: Vitals:   03/25/22 0330 03/25/22 0500 03/25/22 0530 03/25/22 0616  BP:  135/86 (!) 152/94 (!) 161/90  Pulse:  64 63 78  Resp:  '15 15 18  '$ Temp: 97.9 F (36.6 C)   99 F (37.2 C)  TempSrc: Temporal   Oral  SpO2:  96% 96% 96%  Weight:    89.6 kg  Height:       Supplemental O2: Room Air SpO2: 96 %   Physical Exam:  Constitutional: Well-appearing, resting in bed, no acute distress HENT: normocephalic atraumatic Cardiovascular: regular rate and rhythm, no m/r/g Pulmonary/Chest: normal work of breathing on room air, lungs clear to auscultation bilaterally Abdominal: soft, non-tender, non-distended  Filed Weights   03/23/22 2137 03/25/22 0616  Weight: 81.6 kg 89.6 kg     Intake/Output Summary (Last 24 hours) at 03/25/2022 1505 Last data filed at 03/25/2022 0024 Gross per 24 hour  Intake 1982.95 ml  Output 600 ml  Net 1382.95 ml   Net IO Since Admission: 1,382.95 mL [03/25/22 0627]  Pertinent Labs:    Latest Ref Rng & Units 03/25/2022    4:48 AM 03/23/2022   10:52 PM 03/23/2022   10:37 PM  CBC  WBC 4.0 - 10.5 K/uL 5.3     Hemoglobin 13.0 - 17.0 g/dL 14.4  17.7  18.0   Hematocrit 39.0 - 52.0 % 43.5  52.0  53.0   Platelets 150 - 400 K/uL 163          Latest Ref Rng & Units 03/24/2022   10:20 PM 03/24/2022    4:56 PM 03/24/2022   12:56 PM  CMP   Glucose 70 - 99 mg/dL 327  185  234   BUN 8 - 23 mg/dL '20  20  23   '$ Creatinine 0.61 - 1.24 mg/dL 1.29  0.99  1.06   Sodium 135 - 145 mmol/L 136  140  140   Potassium 3.5 - 5.1 mmol/L 4.5  3.3  3.3   Chloride 98 - 111 mmol/L 99  104  102   CO2 22 - 32 mmol/L '26  29  26   '$ Calcium 8.9 - 10.3 mg/dL 8.8  9.1  9.1     Imaging: No results found.  Assessment/Plan:   Principal Problem:   Diabetic ketoacidosis ([place])   Patient Summary: Alec Jacobson is a 77 y.o. male with past medical history of type 2 diabetes, hypertension, CAD who presents emergency department with concerns of fatigue.  Patient found to be in mild DKA.  Patient admitted for further evaluation and management of DKA.  #DKA, resolved #Type 2 diabetes mellitus with long-term insulin use Patient transitioned off of insulin drip, and onto basal insulin.  Patient was started on 40 units of Semglee, with 2-hour overlap of Endo tool.  This morning, fasting glucose at 253.  Patient denies any  polyuria, polydipsia.  He states that he is doing better.  He states he was up and walking, and doing great.  On exam, patient has great lung sounds, and heart sounds.  Patient's blood sugars remain uncontrolled, with fasting glucose of 253.  Will plan to increase to 65 units long-acting insulin with sliding scale.  Will split dose with 35 units insulin a.m., and 30 units at bedtime.  Will continue to follow blood glucose.  Anticipate discharge tomorrow with close outpatient follow-up.  Counseled the patient on the importance of checking blood sugars daily, and patient understood reasoning behind this. -Increase a.m. Lantus to 35 units -Add 30 units Lantus at bedtime -Sliding scale insulin -Continue to monitor BMP -Monitor CBGs  #Prerenal azotemia Patient creatinine did trend back to baseline with fluids yesterday.  Creatinine slightly elevated at 1.29 today.  Does not meet AKI criteria.  Is likely in the setting of dehydration. -Bolus 1 L  lactated Ringer's -Repeat BMP  #Polycythemia, resolved Hemoglobin returned to normal after administration of fluids.  No concerns. -Monitor CBC  #CAD #HLD No concerns acutely. -Continue home metoprolol 25 mg daily -Continue home aspirin 81 mg daily -Continue home rosuvastatin 40 mg daily -Continue home Zetia 10 mg daily  #Hypertension Blood pressure measuring well.  Patient remains on home HCTZ and amlodipine. -Continue home amlodipine 5 mg daily -Continue home HCTZ 25 mg daily  Diet: Carb-Modified IVF: LR,Bolus VTE: Enoxaparin Code: Full PT/OT recs: None   Dispo: Anticipated discharge to Home in 1 days pending Clinical improvement.   [facility] Resident PGY-1 [phone] Please contact the on call pager after 5 pm and on weekends at [phone].

## 2022-03-25 NOTE — Progress Notes (Signed)
PT Cancellation Note  Patient Details Name: Alec Jacobson MRN: [id number] DOB: [birth year]   Cancelled Treatment:    Reason Eval/Treat Not Completed: PT screened, no needs identified, will sign off (pt evaluated by OT and is independent).  Wyona Almas, PT, DPT Acute Rehabilitation Services Office [phone]    Deno Etienne 03/25/2022, 9:00 AM

## 2022-03-26 DIAGNOSIS — E119 Type 2 diabetes mellitus without complications: Secondary | ICD-10-CM | POA: Diagnosis not present

## 2022-03-26 DIAGNOSIS — Z794 Long term (current) use of insulin: Secondary | ICD-10-CM | POA: Diagnosis not present

## 2022-03-26 LAB — CBC
HCT: 40.5 % (ref 39.0–52.0)
Hemoglobin: 14.4 g/dL (ref 13.0–17.0)
MCH: 29 pg (ref 26.0–34.0)
MCHC: 35.6 g/dL (ref 30.0–36.0)
MCV: 81.7 fL (ref 80.0–100.0)
Platelets: 140 10*3/uL — ABNORMAL LOW (ref 150–400)
RBC: 4.96 MIL/uL (ref 4.22–5.81)
RDW: 13 % (ref 11.5–15.5)
WBC: 4.9 10*3/uL (ref 4.0–10.5)
nRBC: 0 % (ref 0.0–0.2)

## 2022-03-26 LAB — BASIC METABOLIC PANEL
Anion gap: 12 (ref 5–15)
Anion gap: 10 (ref 5–15)
BUN: 15 mg/dL (ref 8–23)
BUN: 13 mg/dL (ref 8–23)
CO2: 25 mmol/L (ref 22–32)
CO2: 24 mmol/L (ref 22–32)
Calcium: 8.6 mg/dL — ABNORMAL LOW (ref 8.9–10.3)
Calcium: 8.6 mg/dL — ABNORMAL LOW (ref 8.9–10.3)
Chloride: 97 mmol/L — ABNORMAL LOW (ref 98–111)
Chloride: 97 mmol/L — ABNORMAL LOW (ref 98–111)
Creatinine, Ser: 0.87 mg/dL (ref 0.61–1.24)
Creatinine, Ser: 0.89 mg/dL (ref 0.61–1.24)
GFR, Estimated: >60 mL/min (ref >60)
GFR, Estimated: >60 mL/min (ref >60)
Glucose, Bld: 109 mg/dL — ABNORMAL HIGH (ref 70–99)
Glucose, Bld: 183 mg/dL — ABNORMAL HIGH (ref 70–99)
Potassium: 3 mmol/L — ABNORMAL LOW (ref 3.5–5.1)
Potassium: 3.4 mmol/L — ABNORMAL LOW (ref 3.5–5.1)
Sodium: 134 mmol/L — ABNORMAL LOW (ref 135–145)
Sodium: 131 mmol/L — ABNORMAL LOW (ref 135–145)

## 2022-03-26 LAB — GLUCOSE, CAPILLARY
Glucose-Capillary: 99 mg/dL (ref 70–99)
Glucose-Capillary: 195 mg/dL — ABNORMAL HIGH (ref 70–99)
Glucose-Capillary: 285 mg/dL — ABNORMAL HIGH (ref 70–99)

## 2022-03-26 LAB — MAGNESIUM: Magnesium: 1.5 mg/dL — ABNORMAL LOW (ref 1.7–2.4)

## 2022-03-26 MED ORDER — POTASSIUM CHLORIDE CRYS ER 20 MEQ PO TBCR
40.0000 meq | EXTENDED_RELEASE_TABLET | Freq: Once | ORAL | Status: AC
  Administered 2022-03-26: 40 meq via ORAL
  Filled 2022-03-26: qty 2

## 2022-03-26 MED ORDER — LANTUS SOLOSTAR 100 UNIT/ML ~~LOC~~ SOPN: PEN_INJECTOR | SUBCUTANEOUS | 0 refills | Status: DC

## 2022-03-26 MED ORDER — MAGNESIUM SULFATE 2 GM/50ML IV SOLN
2.0000 g | Freq: Once | INTRAVENOUS | Status: AC
  Administered 2022-03-26: 2 g via INTRAVENOUS
  Filled 2022-03-26: qty 50

## 2022-03-26 MED ORDER — POTASSIUM CHLORIDE CRYS ER 20 MEQ PO TBCR
40.0000 meq | EXTENDED_RELEASE_TABLET | Freq: Two times a day (BID) | ORAL | Status: DC
  Administered 2022-03-26: 40 meq via ORAL
  Filled 2022-03-26: qty 2

## 2022-03-26 NOTE — Progress Notes (Addendum)
Inpatient Diabetes Program Recommendations  AACE/ADA: New Consensus Statement on Inpatient Glycemic Control (2015)  Target Ranges:  Prepandial:   less than 140 mg/dL      Peak postprandial:   less than 180 mg/dL (1-2 hours)      Critically ill patients:  140 - 180 mg/dL   Lab Results  Component Value Date   GLUCAP 195 (H) 03/26/2022   HGBA1C 10.0 (A) 11/19/2021    Review of Glycemic Control  Latest Reference Range & Units 03/25/22 16:42 03/25/22 21:02 03/26/22 07:28 03/26/22 11:42  Glucose-Capillary 70 - 99 mg/dL 173 (H) 234 (H) 99 195 (H)   Diabetes history: DM 2 Outpatient Diabetes medications:  Lantus 65 units q AM Metformin XR 500 mg bid Current orders for Inpatient glycemic control:  Novolog 0-20 units tid with meals and HS Semglee 35 units in the AM and Semglee 35 units q HS  Inpatient Diabetes Program Recommendations:    Note admit with high blood glucose/DKA. Will follow.   Thanks,  Adah Perl, RN, BC-ADM Inpatient Diabetes Coordinator Pager [phone]  (8a-5p)   Addendum 62:  Spoke with patient regarding home DM management.  He states that he was injecting insulin once a day at home.  I asked more regarding his technique with giving insulin and he states that he "injects as close to navel as possible per instructions from his previous endocrinologist".  I asked if he rotates sites and he states "No" and that he was trying to follow his MD's instructions.  I discussed importance of rotating injection sites a recommended avoiding umbilicus area due to potential scar tissue.  Patient verbalized understanding and was able to teach back importance of rotation of injection sites.  He states "that's why my blood sugars are better here".  Will also attach insulin instructions to AVS.  He states that he uses insulin pen and is familiar with use.  Will also attach insulin pen instructions. We also reviewed the signs, symptoms and treatment of low blood sugars as well.   Patient verbalized understanding.

## 2022-03-26 NOTE — Progress Notes (Signed)
Patient given written and verbal discharge instructions-verbalized understanding. Peripheral IV x2 removed without complications. Patient's wife at bedside. Patient voices no questions or concerns at this time.

## 2022-03-26 NOTE — Discharge Summary (Signed)
Name: Alec Jacobson MRN: [id number] DOB: 1945/11/23 77 y.o. PCP: Vincente Liberty, MD  Date of Admission: 03/23/2022  8:51 PM Date of Discharge: 03/26/2022 2:40 pm Attending Physician: Charise Killian, MD  Discharge Diagnosis: 1. Principal Problem:   Diabetic ketoacidosis ([place]) Active Problems:   Dehydration T2DM with long term insulin use Hypokalemia Hypomagnesemia  Discharge Medications: Allergies as of 03/26/2022   No Known Allergies      Medication List     TAKE these medications    amLODipine 5 MG tablet Commonly known as: NORVASC Take 1 tablet (5 mg total) by mouth daily.   aspirin EC 81 MG tablet Take 81 mg by mouth daily. Swallow whole.   BD Pen Needle Nano U/F 32G X 4 MM Misc Generic drug: Insulin Pen Needle USE WITH INSULIN PENS TO  DISPENSE INSULIN DAILY AS DIRECTED   blood glucose meter kit and supplies Dispense based on patient and insurance preference. Use up to four times daily as directed. (FOR ICD-9 250.00, 250.01).   ezetimibe 10 MG tablet Commonly known as: ZETIA Take 1 tablet (10 mg total) by mouth daily.   hydrochlorothiazide 25 MG tablet Commonly known as: HYDRODIURIL Take 25 mg by mouth daily.   Lantus SoloStar 100 UNIT/ML Solostar Pen Generic drug: insulin glargine Inject 35 units in the morning and 35 units at night. Use a new pen needle with each injection. What changed:  how much to take how to take this when to take this additional instructions   metFORMIN 500 MG 24 hr tablet Commonly known as: GLUCOPHAGE-XR Take 2 tablets (1,000 mg total) by mouth daily with breakfast. What changed:  how much to take when to take this   metoprolol succinate 25 MG 24 hr tablet Commonly known as: TOPROL-XL TAKE 1 TABLET BY MOUTH DAILY What changed: when to take this   nitroGLYCERIN 0.4 MG SL tablet Commonly known as: NITROSTAT Place 1 tablet (0.4 mg total) under the tongue every 5 (five) minutes as needed for chest pain (Take this  medicine by mouth as needed. At the first sign of chest pain or tightness place one tablet under your tongue. Let the tablet dissolve under the tongue. Do not swallow whole.  Do not eat or drink, smoke while a tablet is dissolving. If you are not better within 5 minutes after taking ONE dose of nitroglycerin, you may take a SECOND dose. IF YOU ARE GETTING TO THE 3RD DOSE CALL 911.  Do not take more than 3 nitroglycerin tablet.).   rosuvastatin 40 MG tablet Commonly known as: CRESTOR TAKE 1 TABLET BY MOUTH EVERY DAY        Disposition and follow-up:   Mr.Alec Jacobson was discharged from [facility] in Good condition.  At the hospital follow up visit please address:  1.  DKA T2DM -increased lantus to 70 units daily and split dosing into 35 units AM and 35 units PM -Please consider mealtime insulin if A1c is not controlled  Hypokalemia hypomagnesemia -please repeat BMP and Mg and replete as needed  2.  Labs / imaging needed at time of follow-up: bmp, mg  3.  Pending labs/ test needing follow-up: A1c  Follow-up Appointments: Please schedule a follow up appointment with Dr. Katherine Roan within 1-2 weeks of leaving the hospital.  Hospital Course by problem list: 1.   Diabetic ketoacidosis Type 2 diabetes mellitus Patient presented to the emergency department with concerns of fatigue, reported 2-week history of polydipsia, polyuria, decreased appetite, unhealthy eating,  dry mouth and lightheadedness.  Patient had very high glucose levels, which prompted his ED visit.  In emergency room, patient had elevated anion gap, decreased bicarb at 21, with very elevated CBGs into the 500s.  Patient also had elevated beta hydroxybutyrate acid levels. Unclear on insult in which led him into DKA which may have been viral, TSH was wnl. Anion Gap closed x2 03/25/2021 and patient transitioned to home subq lantus 35u AM and 35 PM. Morning fasting blood sugar day of discharge was 99. Patient  discharged feeling well, tolerating oral intake and wanting to go home. Will discharge on home metformin which was held during admission.   Prerenal azotemia Patient presented with elevated creatinine to 1.39, likely secondary in the setting of dehydration secondary to polyuria. At discharge creatinine was 0.87 with resolution of DKA and IVF.   Polycythemia likely in the setting of hemoconcentration  Patient had elevated hemoglobin in the emergency department at 17.4. Patient normally is around 106-14. Marland Kitchen Patient hemoglobin likely elevated in the setting of dehydration, at 14.4 at discharge.  CAD  HLD Continued home metoprolol 25 mg daily, aspirin 81 mg daily,rosuvastatin 40 mg daily,Zetia 10 mg daily   HTN Continued HCTZ 25 mg, amlodipine 5 mg daily at home  Summary: No light headedness, dizzininess, has no complaints. Urinary symptoms have improved. Feeling ok, wants to leave. Increased dose of insulin to 70U total, pt expresses understanding. Sees endocrinologist every 3 months. Dr. Katherine Roan PCP, motivated to make an appointment. Discharge Exam:   BP (!) 150/92 (BP Location: Left Arm)   Pulse 81   Temp 98.6 F (37 C) (Oral)   Resp 19   Ht _0  (1.753 m)   Wt 89.6 kg   SpO2 96%   BMI 29.17 kg/m  Discharge exam:  Constitutional: Well-appearing, resting in bed, no acute distress HENT: normocephalic atraumatic Cardiovascular: regular rate and rhythm, no m/r/g Pulmonary/Chest: normal work of breathing on room air, lungs clear to auscultation bilaterally Abdominal: soft, non-tender, non-distended Ext: warm, dry, no LE edema  Pertinent Labs, Studies, and Procedures:     Latest Ref Rng & Units 03/26/2022    3:34 AM 03/25/2022    4:48 AM 03/23/2022   10:52 PM  CBC  WBC 4.0 - 10.5 K/uL 4.9  5.3    Hemoglobin 13.0 - 17.0 g/dL 14.4  14.4  17.7   Hematocrit 39.0 - 52.0 % 40.5  43.5  52.0   Platelets 150 - 400 K/uL 140  163         Latest Ref Rng & Units 03/26/2022   12:24 PM  03/26/2022    3:34 AM 03/24/2022   10:20 PM  BMP  Glucose 70 - 99 mg/dL 183  109  327   BUN 8 - 23 mg/dL _1 Creatinine 0.61 - 1.24 mg/dL 0.89  0.87  1.29   Sodium 135 - 145 mmol/L 131  134  136   Potassium 3.5 - 5.1 mmol/L 3.4  3.0  4.5   Chloride 98 - 111 mmol/L 97  97  99   CO2 22 - 32 mmol/L _2 Calcium 8.9 - 10.3 mg/dL 8.6  8.6  8.8     Discharge Instructions: Discharge Instructions     Diet - low sodium heart healthy   Complete by: As directed    Increase activity slowly   Complete by: As directed      You were hospitalized for DKA,  which is when your blood sugar gets too high and causes you to become dehydrated, fatigued, nauseous and requires hospitalization to treat. You were rehydrated and your blood sugar was controlled. You electrolytes like potassium and magnesium were replaced. Your home insulin regimen was changed to 35units of lantus in the AM and 35 units of lantus in the PM. Since you were feeling well and eating you were safe to go home. Please see your primary care doctor within 1-2 weeks of leaving the hospital.  Signed: Iona Coach, MD 03/26/2022, 2:39 PM   Pager: Iona Coach, MD Internal Medicine Resident, PGY-1 [facility]  Pager: [phone]

## 2022-03-26 NOTE — Discharge Instructions (Signed)
You were hospitalized for DKA, which is when your blood sugar gets too high and causes you to become dehydrated, fatigued, nauseous and requires hospitalization to treat. You were rehydrated and your blood sugar was controlled. You electrolytes like potassium and magnesium were replaced. Your home insulin regimen was changed to 35units of lantus in the AM and 35 units of lantus in the PM. Since you were feeling well and eating you were safe to go home.

## 2022-03-28 LAB — HEMOGLOBIN A1C
Hgb A1c MFr Bld: >15.5 % — ABNORMAL HIGH (ref 4.8–5.6)
Mean Plasma Glucose: >398 mg/dL

## 2022-03-29 ENCOUNTER — Encounter: Payer: Self-pay | Admitting: Internal Medicine

## 2022-03-29 ENCOUNTER — Ambulatory Visit: Payer: Medicare Other | Attending: Internal Medicine | Admitting: Internal Medicine

## 2022-03-29 VITALS — BP 140/82 | HR 74 | Ht 69.0 in | Wt 199.6 lb

## 2022-03-29 DIAGNOSIS — R011 Cardiac murmur, unspecified: Secondary | ICD-10-CM | POA: Diagnosis not present

## 2022-03-29 DIAGNOSIS — I700 Atherosclerosis of aorta: Secondary | ICD-10-CM | POA: Diagnosis not present

## 2022-03-29 DIAGNOSIS — E785 Hyperlipidemia, unspecified: Secondary | ICD-10-CM

## 2022-03-29 DIAGNOSIS — I10 Essential (primary) hypertension: Secondary | ICD-10-CM

## 2022-03-29 DIAGNOSIS — R600 Localized edema: Secondary | ICD-10-CM | POA: Diagnosis not present

## 2022-03-29 NOTE — Patient Instructions (Signed)
Medication Instructions:  Your physician recommends that you continue on your current medications as directed. Please refer to the Current Medication list given to you today.  *If you need a refill on your cardiac medications before your next appointment, please call your pharmacy*   Lab Work: TODAY: BMP, BNP, Mg, LDL Direct  If you have labs (blood work) drawn today and your tests are completely normal, you will receive your results only by: [place] (if you have MyChart) OR A paper copy in the mail If you have any lab test that is abnormal or we need to change your treatment, we will call you to review the results.   Testing/Procedures: Your physician has requested that you have an echocardiogram. Echocardiography is a painless test that uses sound waves to create images of your heart. It provides your doctor with information about the size and shape of your heart and how well your heart's chambers and valves are working. This procedure takes approximately one hour. There are no restrictions for this procedure. Please do NOT wear cologne, perfume, aftershave, or lotions (deodorant is allowed). Please arrive 15 minutes prior to your appointment time.    Follow-Up: At [facility], you and your health needs are our priority.  As part of our continuing mission to provide you with exceptional heart care, we have created designated Provider Care Teams.  These Care Teams include your primary Cardiologist (physician) and Advanced Practice Providers (APPs -  Physician Assistants and Nurse Practitioners) who all work together to provide you with the care you need, when you need it.  We recommend signing up for the patient portal called "MyChart".  Sign up information is provided on this After Visit Summary.  MyChart is used to connect with patients for Virtual Visits (Telemedicine).  Patients are able to view lab/test results, encounter notes, upcoming appointments, etc.  Non-urgent  messages can be sent to your provider as well.   To learn more about what you can do with MyChart, go to [email or web address].    Your next appointment:   5 month(s)  The format for your next appointment:   In Person  Provider:   Werner Lean, MD      Important Information About Sugar

## 2022-03-29 NOTE — Progress Notes (Signed)
Cardiology Office Note:    Date:  03/29/2022   ID:  Alec Jacobson, DOB February 02, 1946, MRN [id number]  PCP:  Vincente Liberty, MD   [facility] Providers Cardiologist:  Werner Lean, MD     Referring MD: Vincente Liberty, MD   CC: HTN  History of Present Illness:    Alec Jacobson is a 77 y.o. male with a hx of HTN with DM, who presented 01/10/21. 2022: In interim of this visit, patient had Cardiac CT.  He has a moderate RCA lesion that was FFR negative.  Started on BB and PRN nitro.  2023: had positional Pain; has elevated BP. Saw APP 7/23.  Amb BP monitoring was recommended  Patient notes that he is doing well.   He had a very busy Christmas season as he and his wife are heavily involved in the church.  Was feeling ill after Christmas.  Was found to be in DKA.  DC 62/69/48; complicated by hypokalemia and hypomagnesemia.  No chest pain or pressure .  No SOB/DOE and no PND/Orthopnea. Notes slight decrease in his diuretic pills. No weight gain or leg swelling.  No palpitations or syncope.  Ambulatory blood pressure not one.    Past Medical History:  Diagnosis Date   Cancer [facility])    prostate   Chronic kidney disease    over 30 yrs ago, had one stone   Diabetes mellitus without complication ([place])    H/O allergic rhinitis    job related   Hypertension     Past Surgical History:  Procedure Laterality Date   NASAL SINUS SURGERY     SINUS ENDO WITH FUSION Bilateral 04/13/2014   Procedure: SINUS ENDO WITH FUSION;  Surgeon: Ruby Cola, MD;  Location: [facility] OR;  Service: ENT;  Laterality: Bilateral;   TONSILLECTOMY      Current Medications: Current Meds  Medication Sig   amLODipine (NORVASC) 5 MG tablet Take 1 tablet (5 mg total) by mouth daily.   aspirin EC 81 MG tablet Take 81 mg by mouth daily. Swallow whole.   blood glucose meter kit and supplies Dispense based on patient and insurance preference. Use up to four times daily as directed. (FOR ICD-9 250.00,  250.01).   ezetimibe (ZETIA) 10 MG tablet Take 1 tablet (10 mg total) by mouth daily.   hydrochlorothiazide (HYDRODIURIL) 25 MG tablet Take 25 mg by mouth daily.   insulin glargine (LANTUS SOLOSTAR) 100 UNIT/ML Solostar Pen Inject 35 units in the morning and 35 units at night. Use a new pen needle with each injection.   Insulin Pen Needle (BD PEN NEEDLE NANO U/F) 32G X 4 MM MISC USE WITH INSULIN PENS TO  DISPENSE INSULIN DAILY AS DIRECTED   metFORMIN (GLUCOPHAGE-XR) 500 MG 24 hr tablet Take 2 tablets (1,000 mg total) by mouth daily with breakfast. (Patient taking differently: Take 500 mg by mouth 2 (two) times daily with a meal.)   metoprolol succinate (TOPROL-XL) 25 MG 24 hr tablet TAKE 1 TABLET BY MOUTH DAILY (Patient taking differently: Take 25 mg by mouth every evening.)   nitroGLYCERIN (NITROSTAT) 0.4 MG SL tablet Place 1 tablet (0.4 mg total) under the tongue every 5 (five) minutes as needed for chest pain (Take this medicine by mouth as needed. At the first sign of chest pain or tightness place one tablet under your tongue. Let the tablet dissolve under the tongue. Do not swallow whole.  Do not eat or drink, smoke while a tablet is dissolving. If you  are not better within 5 minutes after taking ONE dose of nitroglycerin, you may take a SECOND dose. IF YOU ARE GETTING TO THE 3RD DOSE CALL 911.  Do not take more than 3 nitroglycerin tablet.).   rosuvastatin (CRESTOR) 40 MG tablet TAKE 1 TABLET BY MOUTH EVERY DAY     Allergies:   Patient has no known allergies.   Social History   Socioeconomic History   Marital status: Married    Spouse name: Not on file   Number of children: Not on file   Years of education: Not on file   Highest education level: Not on file  Occupational History   Not on file  Tobacco Use   Smoking status: Never   Smokeless tobacco: Never  Substance and Sexual Activity   Alcohol use: No   Drug use: No   Sexual activity: Not on file  Other Topics Concern   Not on  file  Social History Narrative   Not on file   Social Determinants of Health   Financial Resource Strain: Not on file  Food Insecurity: Not on file  Transportation Needs: Not on file  Physical Activity: Not on file  Stress: Not on file  Social Connections: Not on file    Social: active in his church and comes with his wife  Family History: The patient's family history includes Aneurysm (age of onset: 72) in his father; Diabetes in his sister.  Brain Aneurysm. No heart problems.  ROS:   Please see the history of present illness.     All other systems reviewed and are negative.  EKGs/Labs/Other Studies Reviewed:    The following studies were reviewed today:  EKG:   01/10/21: SR rate 82 inferior infarct  Cardiac Studies & Procedures          CT SCANS  CT CORONARY MORPH W/CTA COR W/SCORE 01/19/2021  Addendum 01/19/2021 12:37 PM ADDENDUM REPORT: 01/19/2021 12:35  CLINICAL DATA:  77 year old with chest pain  EXAM: Cardiac/Coronary  CTA  TECHNIQUE: The patient was scanned on a [employer].  FINDINGS: A 100 kV prospective scan was triggered in the descending thoracic aorta at 111 HU's. Axial non-contrast 3 mm slices were carried out through the heart. The data set was analyzed on a dedicated work station and scored using the [place]. Gantry rotation speed was 250 msecs and collimation was .6 mm. No beta blockade and 0.8 mg of sl NTG was given. The 3D data set was reconstructed in 5% intervals of the 67-82 % of the R-R cycle. Diastolic phases were analyzed on a dedicated work station using MPR, MIP and VRT modes. The patient received 80 cc of contrast.  Aorta:  Normal size.  Aortic atherosclerosis.  No dissection.  Aortic Valve:  Trileaflet.  No calcifications.  Coronary Arteries:  Normal coronary origin.  Right dominance.  RCA is a large dominant artery that gives rise to PDA and PLA. There is non calcified proximal stenosis of 50-69%. FFR  is normal 0.84.  Left main is a large, short artery that gives rise to LAD and LCX arteries.  LAD is a large vessel that has proximal and mid calcified plaque. Proximal 0-24% stenosis. Mid just beyond first diagonal, mixed plaque with 25-49% stenosis. FFR is normal 0.82 just distal to lesion. As LAD continues FFR decreases gradually to 0.70.  LCX is a non-dominant artery that gives rise to one large OM1 branch. There is no plaque. OM 2 is a small caliber vessel  with mid 75-99% stenosis. FFR too small caliber to model.  Other findings:  Normal pulmonary vein drainage into the left atrium.  Normal left atrial appendage without a thrombus.  Normal size of the pulmonary artery.  Please see radiology report for non cardiac findings.  IMPRESSION: 1. Coronary calcium score of 201 (LAD). This was 33 percentile for age and sex matched control.  2. Normal coronary origin with right dominance.  3. Moderate RCA proximal stenosis, mild to moderate mid LAD stenosis - FFR is normal. Small caliber OM2 stenosis 75-99% stenosis mid vessel, too small for PCI.  4.  Aortic atherosclerosis.   Electronically Signed By: Candee Furbish M.D. On: 01/19/2021 12:35  Narrative EXAM: OVER-READ INTERPRETATION  CT CHEST  The following report is an over-read performed by radiologist Dr. Vinnie Langton of [facility], [place] on 01/19/2021. This over-read does not include interpretation of cardiac or coronary anatomy or pathology. The coronary calcium score/coronary CTA interpretation by the cardiologist is attached.  COMPARISON:  None.  FINDINGS: Atherosclerotic calcifications in the thoracic aorta. Within the visualized portions of the thorax there are no suspicious appearing pulmonary nodules or masses, there is no acute consolidative airspace disease, no pleural effusions, no pneumothorax and no lymphadenopathy. Visualized portions of the upper abdomen straight demonstrates several  small low-attenuation lesions in the visualized hepatic parenchyma, incompletely characterized on today's examination, but statistically likely to represent tiny cysts. There are no aggressive appearing lytic or blastic lesions noted in the visualized portions of the skeleton.  IMPRESSION: 1.  Aortic Atherosclerosis (ICD10-I70.0).  Electronically Signed: By: Vinnie Langton M.D. On: 01/19/2021 10:05            Recent Labs: 04/19/2021: ALT 20 03/24/2022: TSH 2.513 03/26/2022: BUN 13; Creatinine, Ser 0.89; Hemoglobin 14.4; Magnesium 1.5; Platelets 140; Potassium 3.4; Sodium 131  Recent Lipid Panel    Component Value Date/Time   CHOL 117 04/19/2021 0953   TRIG 90 04/19/2021 0953   HDL 36 (L) 04/19/2021 0953   CHOLHDL 3.3 04/19/2021 0953   LDLCALC 63 04/19/2021 0953     Physical Exam:    VS:  BP (!) 140/82   Pulse 74   Ht '5\' 9"'$  (1.753 m)   Wt 199 lb 9.6 oz (90.5 kg)   SpO2 97%   BMI 29.48 kg/m     Wt Readings from Last 3 Encounters:  03/29/22 199 lb 9.6 oz (90.5 kg)  03/25/22 197 lb 8.5 oz (89.6 kg)  11/19/21 211 lb (95.7 kg)    Gen: no distress   Neck: No JVD,  Ears: Left ear Pilar Plate Sign Cardiac: No rubs or gallops,systolic crescendo murmur normal rhythm +2 radial pulses  Respiratory: Clear to auscultation bilaterally, normal effort, normal  respiratory rate GI: Soft, nontender, non-distended  MS: +1 edema R leg;  moves all extremities Integument: Skin feels warm Neuro:  At time of evaluation, alert and oriented to person/place/time/situation  Psych: Normal affect, patient feels well   ASSESSMENT:    1. Systolic murmur   2. Lower extremity edema   3. Essential hypertension   4. Aortic atherosclerosis ([place])   5. Hyperlipidemia, unspecified hyperlipidemia type     PLAN:    HTM with DM Complicated by hypomagnesemia and hypokalemia after DKA With LE swelling and new systolic heart murmur (with no CAC on prior CCTA) - BMP, Mg, and BNP today - if OK; will  start cholthalidone and stop HCTZ, if not will add aldactone - continue norvasc at 5 mg - there were  concerns about a pre-renal azotemia; so if Creatinine still elevated would consider increase in IMDUR given below  - will get echo  CAD Aortic Atherosclerosis & HLD - asymptomatic  - anatomy: mod RCA and OM2 is too small for intervention (not FFR modeled) - continue ASA 81 mg;  - on zetia and  rosuvastatin, goal LDL < 55; LDL direct today - continue BB - continue PRN nitrates (none needed)  Spring follow up me or Tessa  Medication Adjustments/Labs and Tests Ordered: Current medicines are reviewed at length with the patient today.  Concerns regarding medicines are outlined above.  Orders Placed This Encounter  Procedures   Basic metabolic panel   Pro b natriuretic peptide (BNP)   Magnesium   LDL cholesterol, direct   ECHOCARDIOGRAM COMPLETE     No orders of the defined types were placed in this encounter.     Patient Instructions  Medication Instructions:  Your physician recommends that you continue on your current medications as directed. Please refer to the Current Medication list given to you today.  *If you need a refill on your cardiac medications before your next appointment, please call your pharmacy*   Lab Work: TODAY: BMP, BNP, Mg, LDL Direct  If you have labs (blood work) drawn today and your tests are completely normal, you will receive your results only by: [place] (if you have MyChart) OR A paper copy in the mail If you have any lab test that is abnormal or we need to change your treatment, we will call you to review the results.   Testing/Procedures: Your physician has requested that you have an echocardiogram. Echocardiography is a painless test that uses sound waves to create images of your heart. It provides your doctor with information about the size and shape of your heart and how well your heart's chambers and valves are working. This  procedure takes approximately one hour. There are no restrictions for this procedure. Please do NOT wear cologne, perfume, aftershave, or lotions (deodorant is allowed). Please arrive 15 minutes prior to your appointment time.    Follow-Up: At [facility], you and your health needs are our priority.  As part of our continuing mission to provide you with exceptional heart care, we have created designated Provider Care Teams.  These Care Teams include your primary Cardiologist (physician) and Advanced Practice Providers (APPs -  Physician Assistants and Nurse Practitioners) who all work together to provide you with the care you need, when you need it.  We recommend signing up for the patient portal called "MyChart".  Sign up information is provided on this After Visit Summary.  MyChart is used to connect with patients for Virtual Visits (Telemedicine).  Patients are able to view lab/test results, encounter notes, upcoming appointments, etc.  Non-urgent messages can be sent to your provider as well.   To learn more about what you can do with MyChart, go to [email or web address].    Your next appointment:   5 month(s)  The format for your next appointment:   In Person  Provider:   Werner Lean, MD      Important Information About Sugar         Signed, Werner Lean, MD  03/29/2022 8:35 AM    [place]

## 2022-03-30 LAB — BASIC METABOLIC PANEL
BUN/Creatinine Ratio: 17 (ref 10–24)
BUN: 16 mg/dL (ref 8–27)
CO2: 27 mmol/L (ref 20–29)
Calcium: 9.2 mg/dL (ref 8.6–10.2)
Chloride: 98 mmol/L (ref 96–106)
Creatinine, Ser: 0.94 mg/dL (ref 0.76–1.27)
Glucose: 192 mg/dL — ABNORMAL HIGH (ref 70–99)
Potassium: 3.6 mmol/L (ref 3.5–5.2)
Sodium: 138 mmol/L (ref 134–144)
eGFR: 85 mL/min/{1.73_m2} (ref >59)

## 2022-03-30 LAB — PRO B NATRIURETIC PEPTIDE: NT-Pro BNP: 59 pg/mL (ref 0–486)

## 2022-03-30 LAB — LDL CHOLESTEROL, DIRECT: LDL Direct: 37 mg/dL (ref 0–99)

## 2022-03-30 LAB — MAGNESIUM: Magnesium: 1.7 mg/dL (ref 1.6–2.3)

## 2022-04-01 ENCOUNTER — Telehealth: Payer: Self-pay

## 2022-04-01 DIAGNOSIS — R600 Localized edema: Secondary | ICD-10-CM

## 2022-04-01 DIAGNOSIS — I10 Essential (primary) hypertension: Secondary | ICD-10-CM

## 2022-04-01 MED ORDER — SPIRONOLACTONE 25 MG PO TABS: 12.5000 mg | ORAL_TABLET | Freq: Every day | ORAL | 3 refills | Status: DC

## 2022-04-01 NOTE — Telephone Encounter (Signed)
-----   Message from Werner Lean, MD sent at 04/01/2022 11:30 AM EST ----- Results: K is low normal BP elevated Plan: Start aldactone 12.5 mg PO daily and BNP in two weeks  Werner Lean, MD

## 2022-04-01 NOTE — Telephone Encounter (Signed)
The patient has been notified of the result and verbalized understanding.  All questions (if any) were answered. Alec Jacobson Alec Wheeler, RN 04/01/2022 12:01 PM   Pt will come in for repeat labs on 04/16/22. Will bring in BP log to appointment.

## 2022-04-12 ENCOUNTER — Encounter: Payer: Self-pay | Admitting: Internal Medicine

## 2022-04-12 ENCOUNTER — Ambulatory Visit: Payer: Medicare Other | Admitting: Internal Medicine

## 2022-04-12 VITALS — BP 120/80 | HR 95 | Ht 69.0 in | Wt 200.6 lb

## 2022-04-12 DIAGNOSIS — E785 Hyperlipidemia, unspecified: Secondary | ICD-10-CM | POA: Diagnosis not present

## 2022-04-12 DIAGNOSIS — E1165 Type 2 diabetes mellitus with hyperglycemia: Secondary | ICD-10-CM

## 2022-04-12 DIAGNOSIS — Z794 Long term (current) use of insulin: Secondary | ICD-10-CM | POA: Diagnosis not present

## 2022-04-12 LAB — LIPID PANEL
Cholesterol: 98 mg/dL (ref 0–200)
HDL: 41.4 mg/dL (ref >39.00)
LDL Cholesterol: 37 mg/dL (ref 0–99)
NonHDL: 56.5
Total CHOL/HDL Ratio: 2
Triglycerides: 98 mg/dL (ref 0.0–149.0)
VLDL: 19.6 mg/dL (ref 0.0–40.0)

## 2022-04-12 LAB — MICROALBUMIN / CREATININE URINE RATIO
Creatinine,U: 183.2 mg/dL
Microalb Creat Ratio: 18.1 mg/g (ref 0.0–30.0)
Microalb, Ur: 33.1 mg/dL — ABNORMAL HIGH (ref 0.0–1.9)

## 2022-04-12 MED ORDER — LANTUS SOLOSTAR 100 UNIT/ML ~~LOC~~ SOPN: 56.0000 [IU] | PEN_INJECTOR | Freq: Every day | SUBCUTANEOUS | 4 refills | Status: DC

## 2022-04-12 MED ORDER — BD PEN NEEDLE NANO U/F 32G X 4 MM MISC: 1.0000 | Freq: Four times a day (QID) | 3 refills | Status: DC

## 2022-04-12 MED ORDER — NOVOLOG FLEXPEN 100 UNIT/ML ~~LOC~~ SOPN: PEN_INJECTOR | SUBCUTANEOUS | 11 refills | Status: DC

## 2022-04-12 MED ORDER — METFORMIN HCL ER 500 MG PO TB24: 1000.0000 mg | ORAL_TABLET | Freq: Every day | ORAL | 2 refills | Status: DC

## 2022-04-12 NOTE — Patient Instructions (Signed)
-  Continue Metformin 500 mg two tablets daily  - Decrease Lantus to 56 units ONCE daily  - Novolog correctional insulin: Use the scale below to help guide you BEFORE each meal   Blood sugar before meal Number of units to inject  Less than 155 0 unit  156 -  188 1 units  186 -  215 2 units  216 -  245 3 units  246 -  275 4 units  276 -  305 5 units  306 -  335 6 units  336 -  365 7 units  366 -  395 8 units   HOW TO TREAT LOW BLOOD SUGARS (Blood sugar LESS THAN 70 MG/DL) Please follow the RULE OF 15 for the treatment of hypoglycemia treatment (when your (blood sugars are less than 70 mg/dL)   STEP 1: Take 15 grams of carbohydrates when your blood sugar is low, which includes:  3-4 GLUCOSE TABS  OR 3-4 OZ OF JUICE OR REGULAR SODA OR ONE TUBE OF GLUCOSE GEL    STEP 2: RECHECK blood sugar in 15 MINUTES STEP 3: If your blood sugar is still low at the 15 minute recheck --> then, go back to STEP 1 and treat AGAIN with another 15 grams of carbohydrates.

## 2022-04-12 NOTE — Progress Notes (Addendum)
Name: Alec Jacobson  Age/ Sex: 77 y.o., male   MRN/ DOB: [id number], May 22, 1945     PCP: Vincente Liberty, MD   Reason for Endocrinology Evaluation: Type 2 Diabetes Mellitus  Initial Endocrine Consultative Visit: 05/24/2015    PATIENT IDENTIFIER: Alec Jacobson is a 77 y.o. male with a past medical history of CAD, DM, dyslipidemia . The patient has followed with Endocrinology clinic since 05/24/2015 for consultative assistance with management of his diabetes.  DIABETIC HISTORY:  Alec Jacobson was diagnosed with T1DM in 2017. His hemoglobin A1c has ranged from 5.8 % in 2018, peaking at 14.9% in 2021.   GLP-1 agonists was cost prohibitive  He was followed by Dr. Loanne Drilling from 2017 until 05/2021  On his initial visit with me had an A1c of 10.0%, he was on basal insulin only, metformin started 10/2021  SUBJECTIVE:   During the last visit (11/19/2021): A1c 10.0%  Today (04/12/2022): Alec Jacobson is here for a follow up on diabetes management. He is accompanied by his spouse today. He checks his blood sugars 0 times daily, because he does not like pricking his finger.    He had an ED presentation for hyperglycemia and DKA in December 2023, Lantus was increased.  BG was in the 500 range as well as elevated beta hydroxybutyrate  Had a follow-up with cardiology on 03/29/2022  Denies nausea, vomiting or diarrhea      HOME DIABETES REGIMEN:  Metformin 500 mg, 2 tabs daily Lantus 35 units BID     Statin: yes ACE-I/ARB: yes    METER DOWNLOAD SUMMARY: 1/5-1/18/2024 Average Number Tests/Day = 0.7 Overall Mean FS Glucose = 158 Standard Deviation = 75.8  BG Ranges: Low = 66 High = 291  BG Target % Results: % In target = 50 % Over target = 40 % Under target = 10  Hypoglycemic Events/30 Days: BG < 50 = 0 Episodes of symptomatic severe hypoglycemia = 0       DIABETIC COMPLICATIONS: Microvascular complications:   Denies: CKD  Last Eye Exam: Completed  2022  Macrovascular complications:  CAD Denies:  CVA, PVD   HISTORY:  Past Medical History:  Past Medical History:  Diagnosis Date   Cancer ([place])    prostate   Chronic kidney disease    over 30 yrs ago, had one stone   Diabetes mellitus without complication ([place])    H/O allergic rhinitis    job related   Hypertension    Past Surgical History:  Past Surgical History:  Procedure Laterality Date   NASAL SINUS SURGERY     SINUS ENDO WITH FUSION Bilateral 04/13/2014   Procedure: SINUS ENDO WITH FUSION;  Surgeon: Ruby Cola, MD;  Location: [facility];  Service: ENT;  Laterality: Bilateral;   TONSILLECTOMY     Social History:  reports that he has never smoked. He has never used smokeless tobacco. He reports that he does not drink alcohol and does not use drugs. Family History:  Family History  Problem Relation Age of Onset   Aneurysm Father 70   Diabetes Sister      HOME MEDICATIONS: Allergies as of 04/12/2022   No Known Allergies      Medication List        Accurate as of April 12, 2022 12:02 PM. If you have any questions, ask your nurse or doctor.          amLODipine 5 MG tablet Commonly known as: NORVASC Take 1 tablet (5 mg total) by  mouth daily.   aspirin EC 81 MG tablet Take 81 mg by mouth daily. Swallow whole.   BD Pen Needle Nano U/F 32G X 4 MM Misc Generic drug: Insulin Pen Needle 1 Device by Other route in the morning, at noon, in the evening, and at bedtime. USE WITH INSULIN PENS TO  DISPENSE INSULIN DAILY AS DIRECTED What changed:  how much to take how to take this when to take this Changed by: Dorita Sciara, MD   blood glucose meter kit and supplies Dispense based on patient and insurance preference. Use up to four times daily as directed. (FOR ICD-9 250.00, 250.01).   ezetimibe 10 MG tablet Commonly known as: ZETIA Take 1 tablet (10 mg total) by mouth daily.   hydrochlorothiazide 25 MG tablet Commonly known as: HYDRODIURIL Take  25 mg by mouth daily.   Lantus SoloStar 100 UNIT/ML Solostar Pen Generic drug: insulin glargine Inject 56 Units into the skin daily. Inject 35 units in the morning and 35 units at night. Use a new pen needle with each injection. What changed:  how much to take how to take this when to take this Changed by: Dorita Sciara, MD   metFORMIN 500 MG 24 hr tablet Commonly known as: GLUCOPHAGE-XR Take 2 tablets (1,000 mg total) by mouth daily with breakfast. What changed:  how much to take when to take this   metoprolol succinate 25 MG 24 hr tablet Commonly known as: TOPROL-XL TAKE 1 TABLET BY MOUTH DAILY What changed: when to take this   nitroGLYCERIN 0.4 MG SL tablet Commonly known as: NITROSTAT Place 1 tablet (0.4 mg total) under the tongue every 5 (five) minutes as needed for chest pain (Take this medicine by mouth as needed. At the first sign of chest pain or tightness place one tablet under your tongue. Let the tablet dissolve under the tongue. Do not swallow whole.  Do not eat or drink, smoke while a tablet is dissolving. If you are not better within 5 minutes after taking ONE dose of nitroglycerin, you may take a SECOND dose. IF YOU ARE GETTING TO THE 3RD DOSE CALL 911.  Do not take more than 3 nitroglycerin tablet.).   NovoLOG FlexPen 100 UNIT/ML FlexPen Generic drug: insulin aspart Max daily 30 units Started by: Dorita Sciara, MD   rosuvastatin 40 MG tablet Commonly known as: CRESTOR TAKE 1 TABLET BY MOUTH EVERY DAY   spironolactone 25 MG tablet Commonly known as: Aldactone Take 0.5 tablets (12.5 mg total) by mouth daily.         OBJECTIVE:   Vital Signs: BP 120/80 (BP Location: Left Arm, Patient Position: Sitting, Cuff Size: Large)   Pulse 95   Ht '5\' 9"'$  (1.753 m)   Wt 200 lb 9.6 oz (91 kg)   SpO2 96%   BMI 29.62 kg/m   Wt Readings from Last 3 Encounters:  04/12/22 200 lb 9.6 oz (91 kg)  03/29/22 199 lb 9.6 oz (90.5 kg)  03/25/22 197 lb 8.5 oz  (89.6 kg)     Exam: General: Pt appears well and is in NAD  Neck: General: Supple without adenopathy. Thyroid: Thyroid size normal.  No goiter or nodules appreciated.  Lungs: Clear with good BS bilat with no rales, rhonchi, or wheezes  Heart: RRR  Abdomen: Normoactive bowel sounds, soft, nontender, without masses or organomegaly palpable  Extremities: No pretibial edema. .  Neuro: MS is good with appropriate affect, pt is alert and Ox3    DM  foot exam: 11/19/2021  The skin of the feet is intact without sores or ulcerations. The pedal pulses are 2+ on right and 2+ on left. The sensation is intact to a screening 5.07, 10 gram monofilament bilaterally          DATA REVIEWED:  Lab Results  Component Value Date   HGBA1C >15.5 (H) 03/24/2022   HGBA1C 10.0 (A) 11/19/2021   HGBA1C 11.4 (A) 06/21/2021    Latest Reference Range & Units 03/29/22 08:38  Sodium 134 - 144 mmol/L 138  Potassium 3.5 - 5.2 mmol/L 3.6  Chloride 96 - 106 mmol/L 98  CO2 20 - 29 mmol/L 27  Glucose 70 - 99 mg/dL 192 (H)  BUN 8 - 27 mg/dL 16  Creatinine 0.76 - 1.27 mg/dL 0.94  Calcium 8.6 - 10.2 mg/dL 9.2  BUN/Creatinine Ratio 10 - 24  17  eGFR >59 mL/min/1.73 85  Magnesium 1.6 - 2.3 mg/dL 1.7    Latest Reference Range & Units 04/12/22 12:06  Total CHOL/HDL Ratio  2  Cholesterol 0 - 200 mg/dL 98  HDL Cholesterol >39.00 mg/dL 41.40  LDL (calc) 0 - 99 mg/dL 37  MICROALB/CREAT RATIO 0.0 - 30.0 mg/g 18.1  NonHDL  56.50  Triglycerides 0.0 - 149.0 mg/dL 98.0  VLDL 0.0 - 40.0 mg/dL 19.6    Latest Reference Range & Units 04/12/22 12:06  Creatinine,U mg/dL 183.2  Microalb, Ur 0.0 - 1.9 mg/dL 33.1 (H)  MICROALB/CREAT RATIO 0.0 - 30.0 mg/g 18.1     ASSESSMENT / PLAN / RECOMMENDATIONS:   1) Type 2 Diabetes Mellitus, poorly controlled, With macrovascular  complications - Most recent A1c of >15.5 %. Goal A1c < 7.0 %.    -Recent admission for DKA - There's a discrepancy in his chart regarding Type 1 vs  Type 2 DM , we have discussed adding prandial insulin, in the meantime we will proceed with GAD-65 and islet cell antibody check - GLP-1 agonists  cost prohibitive  - NOT a candidate for SGLT-2i due to DKA 02/2022 - Dexcom sample was provided , per pt his PCP is working on a CGM     MEDICATIONS: Continue Metformin 500 mg XR, 2 tabs daily  Decrease  Lantus 56 units daily  Start Novolog per correction scale (BG-125/30)  EDUCATION / INSTRUCTIONS: BG monitoring instructions: Patient is instructed to check his blood sugars 1 times a day. Call [facility] if: BG persistently < 70  I reviewed the Rule of 15 for the treatment of hypoglycemia in detail with the patient. Literature supplied.    2) Diabetic complications:  Eye: Does not have known diabetic retinopathy.  Neuro/ Feet: Does not have known diabetic peripheral neuropathy .  Renal: Patient does not have known baseline CKD. He   is not on an ACEI/ARB at present.    3) Dyslipidemia:  -LDL at goal  Medication Continue rosuvastatin 40 mg daily   F/U in 3 months     Signed electronically by: Mack Guise, MD  [facility] Endocrinology  [facility] [address] [place], [place] [address] Phone: [phone] FAX: [phone]   CC: Vincente Liberty, [place] [address] Phone: [phone]  Fax: [phone]  Return to Endocrinology clinic as below: Future Appointments  Date Time Provider [place]  04/16/2022 10:00 AM [facility]  04/25/2022  9:35 AM [facility] 5 [id number]  07/22/2022  8:40 AM Werner Lean, MD [facility]

## 2022-04-15 DIAGNOSIS — E785 Hyperlipidemia, unspecified: Secondary | ICD-10-CM | POA: Insufficient documentation

## 2022-04-16 ENCOUNTER — Other Ambulatory Visit: Payer: Self-pay

## 2022-04-16 ENCOUNTER — Ambulatory Visit: Payer: Medicare Other | Attending: Internal Medicine

## 2022-04-16 DIAGNOSIS — I10 Essential (primary) hypertension: Secondary | ICD-10-CM

## 2022-04-16 DIAGNOSIS — E1165 Type 2 diabetes mellitus with hyperglycemia: Secondary | ICD-10-CM

## 2022-04-16 DIAGNOSIS — R600 Localized edema: Secondary | ICD-10-CM

## 2022-04-16 LAB — BASIC METABOLIC PANEL
BUN/Creatinine Ratio: 30 — ABNORMAL HIGH (ref 10–24)
BUN: 27 mg/dL (ref 8–27)
CO2: 27 mmol/L (ref 20–29)
Calcium: 9.6 mg/dL (ref 8.6–10.2)
Chloride: 99 mmol/L (ref 96–106)
Creatinine, Ser: 0.91 mg/dL (ref 0.76–1.27)
Glucose: 180 mg/dL — ABNORMAL HIGH (ref 70–99)
Potassium: 4.3 mmol/L (ref 3.5–5.2)
Sodium: 139 mmol/L (ref 134–144)
eGFR: 88 mL/min/{1.73_m2} (ref >59)

## 2022-04-16 MED ORDER — LANTUS SOLOSTAR 100 UNIT/ML ~~LOC~~ SOPN: 56.0000 [IU] | PEN_INJECTOR | Freq: Every day | SUBCUTANEOUS | 4 refills | Status: DC

## 2022-04-18 LAB — ISLET CELL AB SCREEN RFLX TO TITER: ISLET CELL ANTIBODY SCREEN: NEGATIVE

## 2022-04-18 LAB — GLUTAMIC ACID DECARBOXYLASE AUTO ABS: Glutamic Acid Decarb Ab: <5 IU/mL (ref <5)

## 2022-04-22 ENCOUNTER — Other Ambulatory Visit: Payer: Self-pay | Admitting: Internal Medicine

## 2022-04-25 ENCOUNTER — Ambulatory Visit (HOSPITAL_COMMUNITY): Payer: Medicare Other | Attending: Internal Medicine

## 2022-04-25 ENCOUNTER — Other Ambulatory Visit: Payer: Self-pay

## 2022-04-25 DIAGNOSIS — I10 Essential (primary) hypertension: Secondary | ICD-10-CM | POA: Diagnosis not present

## 2022-04-25 DIAGNOSIS — R600 Localized edema: Secondary | ICD-10-CM | POA: Insufficient documentation

## 2022-04-25 DIAGNOSIS — R011 Cardiac murmur, unspecified: Secondary | ICD-10-CM | POA: Diagnosis present

## 2022-04-25 LAB — ECHOCARDIOGRAM COMPLETE
Area-P 1/2: 3.1 cm2
P 1/2 time: 903 msec
S' Lateral: 2.8 cm

## 2022-04-25 MED ORDER — NOVOLOG FLEXPEN 100 UNIT/ML ~~LOC~~ SOPN: PEN_INJECTOR | SUBCUTANEOUS | 11 refills | Status: DC

## 2022-07-18 ENCOUNTER — Encounter: Payer: Self-pay | Admitting: Internal Medicine

## 2022-07-18 ENCOUNTER — Ambulatory Visit: Payer: Medicare Other | Admitting: Internal Medicine

## 2022-07-18 VITALS — BP 120/82 | HR 68 | Ht 69.0 in | Wt 206.0 lb

## 2022-07-18 DIAGNOSIS — E1165 Type 2 diabetes mellitus with hyperglycemia: Secondary | ICD-10-CM | POA: Diagnosis not present

## 2022-07-18 DIAGNOSIS — Z794 Long term (current) use of insulin: Secondary | ICD-10-CM

## 2022-07-18 DIAGNOSIS — E785 Hyperlipidemia, unspecified: Secondary | ICD-10-CM | POA: Diagnosis not present

## 2022-07-18 LAB — POCT GLYCOSYLATED HEMOGLOBIN (HGB A1C): Hemoglobin A1C: 7.8 % — AB (ref 4.0–5.6)

## 2022-07-18 MED ORDER — LANTUS SOLOSTAR 100 UNIT/ML ~~LOC~~ SOPN: 52.0000 [IU] | PEN_INJECTOR | Freq: Every day | SUBCUTANEOUS | 4 refills | Status: DC

## 2022-07-18 NOTE — Addendum Note (Signed)
Addended by: Scarlette Shorts on: 07/18/2022 01:11 PM   Modules accepted: Level of Service

## 2022-07-18 NOTE — Progress Notes (Signed)
Name: Alec Jacobson  Age/ Sex: 77 y.o., male   MRN/ DOB: [id number], 01-Jun-1945     PCP: Corine Shelter, MD   Reason for Endocrinology Evaluation: Type 2 Diabetes Mellitus  Initial Endocrine Consultative Visit: 05/24/2015    PATIENT IDENTIFIER: [name] is a 77 y.o. male with a past medical history of CAD, DM, dyslipidemia . The patient has followed with Endocrinology clinic since 05/24/2015 for consultative assistance with management of his diabetes.  DIABETIC HISTORY:  Mr. Mulvehill was diagnosed with T1DM in 2017. His hemoglobin A1c has ranged from 5.8 % in 2018, peaking at 14.9% in 2021.   GLP-1 agonists were cost prohibitive  He was followed by Dr. Everardo All from 2017 until 05/2021  On his initial visit with me had an A1c of 10.0%, he was on basal insulin only, metformin started 10/2021  SUBJECTIVE:    During the last visit (04/12/2021): A1c 15.5%  Today (07/18/2022): [name] is here for a follow up on diabetes management. He is accompanied by his spouse today. He checks his blood sugars multiples times daily,through CGM   Had a follow-up with cardiology on 03/29/2022  Denies nausea, vomiting or diarrhea      HOME DIABETES REGIMEN:  Metformin 500 mg, 2 tabs daily Lantus 56 units daily  Novolog per correction scale (BG-125/30)    Statin: yes ACE-I/ARB: yes   CONTINUOUS GLUCOSE MONITORING RECORD INTERPRETATION    Dates of Recording: 4/12-4/25/2024  Sensor description:dexcom  Results statistics:   CGM use % of time 93  Average and SD 181/45  Time in range     50   %  % Time Above 180 44  % Time above 250 5  % Time Below target <1   Glycemic patterns summary: BG's trend down at night and increase during the day   Hyperglycemic episodes  postprandial   Hypoglycemic episodes occurred at night   Overnight periods: trends down         DIABETIC COMPLICATIONS: Microvascular complications:   Denies: CKD  Last Eye Exam: Completed  2022  Macrovascular complications:  CAD Denies:  CVA, PVD   HISTORY:  Past Medical History:  Past Medical History:  Diagnosis Date   Cancer    prostate   Chronic kidney disease    over 30 yrs ago, had one stone   Diabetes mellitus without complication    H/O allergic rhinitis    job related   Hypertension    Past Surgical History:  Past Surgical History:  Procedure Laterality Date   NASAL SINUS SURGERY     SINUS ENDO WITH FUSION Bilateral 04/13/2014   Procedure: SINUS ENDO WITH FUSION;  Surgeon: Melvenia Beam, MD;  Location: [facility] OR;  Service: ENT;  Laterality: Bilateral;   TONSILLECTOMY     Social History:  reports that he has never smoked. He has never used smokeless tobacco. He reports that he does not drink alcohol and does not use drugs. Family History:  Family History  Problem Relation Age of Onset   Aneurysm Father 52   Diabetes Sister      HOME MEDICATIONS: Allergies as of 07/18/2022   No Known Allergies      Medication List        Accurate as of July 18, 2022 11:55 AM. If you have any questions, ask your nurse or doctor.          amLODipine 5 MG tablet Commonly known as: NORVASC Take 1 tablet (5 mg total) by mouth  daily.   aspirin EC 81 MG tablet Take 81 mg by mouth daily. Swallow whole.   BD Pen Needle Nano U/F 32G X 4 MM Misc Generic drug: Insulin Pen Needle 1 Device by Other route in the morning, at noon, in the evening, and at bedtime. USE WITH INSULIN PENS TO  DISPENSE INSULIN DAILY AS DIRECTED   blood glucose meter kit and supplies Dispense based on patient and insurance preference. Use up to four times daily as directed. (FOR ICD-9 250.00, 250.01).   ezetimibe 10 MG tablet Commonly known as: ZETIA TAKE 1 TABLET BY MOUTH EVERY DAY   hydrochlorothiazide 25 MG tablet Commonly known as: HYDRODIURIL Take 25 mg by mouth daily.   Lantus SoloStar 100 UNIT/ML Solostar Pen Generic drug: insulin glargine Inject 56 Units into the skin  daily.   metFORMIN 500 MG 24 hr tablet Commonly known as: GLUCOPHAGE-XR Take 2 tablets (1,000 mg total) by mouth daily with breakfast.   metoprolol succinate 25 MG 24 hr tablet Commonly known as: TOPROL-XL TAKE 1 TABLET BY MOUTH DAILY What changed: when to take this   nitroGLYCERIN 0.4 MG SL tablet Commonly known as: NITROSTAT Place 1 tablet (0.4 mg total) under the tongue every 5 (five) minutes as needed for chest pain (Take this medicine by mouth as needed. At the first sign of chest pain or tightness place one tablet under your tongue. Let the tablet dissolve under the tongue. Do not swallow whole.  Do not eat or drink, smoke while a tablet is dissolving. If you are not better within 5 minutes after taking ONE dose of nitroglycerin, you may take a SECOND dose. IF YOU ARE GETTING TO THE 3RD DOSE CALL 911.  Do not take more than 3 nitroglycerin tablet.).   NovoLOG FlexPen 100 UNIT/ML FlexPen Generic drug: insulin aspart Inject with meals SQ as directed up to 30 units max daily dose.   rosuvastatin 40 MG tablet Commonly known as: CRESTOR TAKE 1 TABLET BY MOUTH EVERY DAY   spironolactone 25 MG tablet Commonly known as: Aldactone Take 0.5 tablets (12.5 mg total) by mouth daily.         OBJECTIVE:   Vital Signs: BP 120/82 (BP Location: Left Arm, Patient Position: Sitting, Cuff Size: Large)   Pulse 68   Ht  (1.753 m)   Wt 206 lb (93.4 kg)   SpO2 98%   BMI 30.42 kg/m   Wt Readings from Last 3 Encounters:  07/18/22 206 lb (93.4 kg)  04/12/22 200 lb 9.6 oz (91 kg)  03/29/22 199 lb 9.6 oz (90.5 kg)     Exam: General: Pt appears well and is in NAD  Neck: General: Supple without adenopathy. Thyroid: Thyroid size normal.  No goiter or nodules appreciated.  Lungs: Clear with good BS bilat with no rales, rhonchi, or wheezes  Heart: RRR  Abdomen: Normoactive bowel sounds, soft, nontender, without masses or organomegaly palpable  Extremities: No pretibial edema. .   Neuro: MS is good with appropriate affect, pt is alert and Ox3    DM foot exam: 07/18/2022  The skin of the feet is intact without sores or ulcerations. The pedal pulses are 2+ on right and 2+ on left. The sensation is intact to a screening 5.07, 10 gram monofilament bilaterally          DATA REVIEWED:  Lab Results  Component Value Date   HGBA1C >15.5 (H) 03/24/2022   HGBA1C 10.0 (A) 11/19/2021   HGBA1C 11.4 (A) 06/21/2021  Latest Reference Range & Units 03/29/22 08:38  Sodium 134 - 144 mmol/L 138  Potassium 3.5 - 5.2 mmol/L 3.6  Chloride 96 - 106 mmol/L 98  CO2 20 - 29 mmol/L 27  Glucose 70 - 99 mg/dL 161 (H)  BUN 8 - 27 mg/dL 16  Creatinine 0.96 - 0.45 mg/dL 4.09  Calcium 8.6 - 81.1 mg/dL 9.2  BUN/Creatinine Ratio 10 - 24  17  eGFR >59 mL/min/1.73 85  Magnesium 1.6 - 2.3 mg/dL 1.7    Latest Reference Range & Units 04/12/22 12:06  Total CHOL/HDL Ratio  2  Cholesterol 0 - 200 mg/dL 98  HDL Cholesterol >91.47 mg/dL 82.95  LDL (calc) 0 - 99 mg/dL 37  MICROALB/CREAT RATIO 0.0 - 30.0 mg/g 18.1  NonHDL  56.50  Triglycerides 0.0 - 149.0 mg/dL 62.1  VLDL 0.0 - 30.8 mg/dL 65.7    Latest Reference Range & Units 04/12/22 12:06  Creatinine,U mg/dL 846.9  Microalb, Ur 0.0 - 1.9 mg/dL 62.9 (H)  MICROALB/CREAT RATIO 0.0 - 30.0 mg/g 18.1     ASSESSMENT / PLAN / RECOMMENDATIONS:   1) Type 2 Diabetes Mellitus, Sub-Optimally  controlled, With macrovascular  complications - Most recent A1c of 7.8%. Goal A1c < 7.0 %.    - A1c down from 15.5% to 7.8 %  - Praised the pt on improved glycemic control  - GAD-65 and Islet cell Ab's are negative  - GLP-1 agonists  cost prohibitive , I offered PA program forms but he would like to hold off at this time  - NOT a candidate for SGLT-2i due to DKA 02/2022  MEDICATIONS: Continue Metformin 500 mg XR, 2 tabs daily  Decrease  Lantus 52 units daily  Continue Novolog per correction scale (BG-125/30) TIDQAC and QHS  EDUCATION /  INSTRUCTIONS: BG monitoring instructions: Patient is instructed to check his blood sugars 1 times a day. Call [facility] if: BG persistently < 70  I reviewed the Rule of 15 for the treatment of hypoglycemia in detail with the patient. Literature supplied.    2) Diabetic complications:  Eye: Does not have known diabetic retinopathy.  Neuro/ Feet: Does not have known diabetic peripheral neuropathy .  Renal: Patient does not have known baseline CKD. He   is not on an ACEI/ARB at present.    3) Dyslipidemia:  -LDL has been at goal  Medication Continue rosuvastatin 40 mg daily   F/U in 4 months    I spent 25 minutes preparing to see the patient by review of recent labs, imaging and procedures, obtaining and reviewing separately obtained history, communicating with the patient, ordering medications, tests or procedures, and documenting clinical information in the EHR including the differential Dx, treatment, and any further evaluation and other management    Signed electronically by: Lyndle Herrlich, MD  [facility] Endocrinology  [facility] [address] [place] [place], [place] [address] Phone: [phone] FAX: [phone]   CC: Corine Shelter, MD [address] [place] [address] Phone: [phone]  Fax: [phone]  Return to Endocrinology clinic as below: Future Appointments  Date Time Provider Department Center  07/22/2022  8:40 AM Christell Constant, MD [facility]

## 2022-07-18 NOTE — Patient Instructions (Addendum)
-   Continue Metformin 500 mg two tablets daily  - Decrease Lantus to 52 units ONCE daily  - Novolog correctional insulin: Use the scale below to help guide you BEFORE each meal and Bedtime   Blood sugar before meal Number of units to inject  Less than 155 0 unit  156 -  185 1 units  186 -  215 2 units  216 -  245 3 units  246 -  275 4 units  276 -  305 5 units  306 -  335 6 units  336 -  365 7 units  366 -  395 8 units   HOW TO TREAT LOW BLOOD SUGARS (Blood sugar LESS THAN 70 MG/DL) Please follow the RULE OF 15 for the treatment of hypoglycemia treatment (when your (blood sugars are less than 70 mg/dL)   STEP 1: Take 15 grams of carbohydrates when your blood sugar is low, which includes:  3-4 GLUCOSE TABS  OR 3-4 OZ OF JUICE OR REGULAR SODA OR ONE TUBE OF GLUCOSE GEL    STEP 2: RECHECK blood sugar in 15 MINUTES STEP 3: If your blood sugar is still low at the 15 minute recheck --> then, go back to STEP 1 and treat AGAIN with another 15 grams of carbohydrates.

## 2022-07-22 ENCOUNTER — Encounter: Payer: Self-pay | Admitting: Internal Medicine

## 2022-07-22 ENCOUNTER — Ambulatory Visit: Payer: Medicare Other | Attending: Internal Medicine | Admitting: Internal Medicine

## 2022-07-22 VITALS — BP 136/58 | HR 58 | Ht 68.0 in | Wt 209.0 lb

## 2022-07-22 DIAGNOSIS — I25709 Atherosclerosis of coronary artery bypass graft(s), unspecified, with unspecified angina pectoris: Secondary | ICD-10-CM | POA: Diagnosis not present

## 2022-07-22 DIAGNOSIS — I10 Essential (primary) hypertension: Secondary | ICD-10-CM | POA: Diagnosis not present

## 2022-07-22 DIAGNOSIS — E1165 Type 2 diabetes mellitus with hyperglycemia: Secondary | ICD-10-CM

## 2022-07-22 DIAGNOSIS — E785 Hyperlipidemia, unspecified: Secondary | ICD-10-CM

## 2022-07-22 DIAGNOSIS — I700 Atherosclerosis of aorta: Secondary | ICD-10-CM

## 2022-07-22 DIAGNOSIS — Z794 Long term (current) use of insulin: Secondary | ICD-10-CM

## 2022-07-22 NOTE — Progress Notes (Signed)
Cardiology Office Note:    Date:  07/22/2022   ID:  JAHLEN BOLLMAN, DOB 1945/10/18, MRN [id number]  PCP:  Corine Shelter, MD   [facility] Providers Cardiologist:  Christell Constant, MD     Referring MD: Corine Shelter, MD   CC: HTN  History of Present Illness:    GEROME Jacobson is a 77 y.o. male with a hx of HTN with DM, who presented 01/10/21. 2022: In interim of this visit, patient had Cardiac CT.  He has a moderate RCA lesion that was FFR negative.  Started on BB and PRN nitro.  2023: had positional Pain; has elevated BP. Saw APP 7/23.  Amb BP monitoring was recommended 2024 Seen soon after his DKA visit end of 2023.  Had low K and elevated BP.  Started MRA.  Had echo showing mild AI.  Patient notes that he is doing well .   There are no interval hospital/ED visit.   Lost 10 pounds and needs less Insulin. No Leg swelling. Has been doing BG monitoring at home.  No chest pain or pressure .  No SOB/DOE and no PND/Orthopnea. No palpitations or syncope.  Ambulatory blood pressure normal at home.   Past Medical History:  Diagnosis Date   Cancer [facility])    prostate   Chronic kidney disease    over 30 yrs ago, had one stone   Diabetes mellitus without complication (HCC)    H/O allergic rhinitis    job related   Hypertension     Past Surgical History:  Procedure Laterality Date   NASAL SINUS SURGERY     SINUS ENDO WITH FUSION Bilateral 04/13/2014   Procedure: SINUS ENDO WITH FUSION;  Surgeon: Melvenia Beam, MD;  Location: [facility] OR;  Service: ENT;  Laterality: Bilateral;   TONSILLECTOMY      Current Medications: Current Meds  Medication Sig   amLODipine (NORVASC) 5 MG tablet Take 1 tablet (5 mg total) by mouth daily.   aspirin EC 81 MG tablet Take 81 mg by mouth daily. Swallow whole.   blood glucose meter kit and supplies Dispense based on patient and insurance preference. Use up to four times daily as directed. (FOR ICD-9 250.00, 250.01).   ezetimibe  (ZETIA) 10 MG tablet TAKE 1 TABLET BY MOUTH EVERY DAY   hydrochlorothiazide (HYDRODIURIL) 25 MG tablet Take 25 mg by mouth daily.   insulin aspart (NOVOLOG FLEXPEN) 100 UNIT/ML FlexPen Inject with meals SQ as directed up to 30 units max daily dose.   insulin glargine (LANTUS SOLOSTAR) 100 UNIT/ML Solostar Pen Inject 52 Units into the skin daily.   Insulin Pen Needle (BD PEN NEEDLE NANO U/F) 32G X 4 MM MISC 1 Device by Other route in the morning, at noon, in the evening, and at bedtime. USE WITH INSULIN PENS TO  DISPENSE INSULIN DAILY AS DIRECTED   metFORMIN (GLUCOPHAGE-XR) 500 MG 24 hr tablet Take 2 tablets (1,000 mg total) by mouth daily with breakfast.   metoprolol succinate (TOPROL-XL) 25 MG 24 hr tablet TAKE 1 TABLET BY MOUTH DAILY   nitroGLYCERIN (NITROSTAT) 0.4 MG SL tablet Place 1 tablet (0.4 mg total) under the tongue every 5 (five) minutes as needed for chest pain (Take this medicine by mouth as needed. At the first sign of chest pain or tightness place one tablet under your tongue. Let the tablet dissolve under the tongue. Do not swallow whole.  Do not eat or drink, smoke while a tablet is dissolving. If you are not  better within 5 minutes after taking ONE dose of nitroglycerin, you may take a SECOND dose. IF YOU ARE GETTING TO THE 3RD DOSE CALL 911.  Do not take more than 3 nitroglycerin tablet.).   rosuvastatin (CRESTOR) 40 MG tablet TAKE 1 TABLET BY MOUTH EVERY DAY   spironolactone (ALDACTONE) 25 MG tablet Take 0.5 tablets (12.5 mg total) by mouth daily.     Allergies:   Patient has no known allergies.   Social History   Socioeconomic History   Marital status: Married    Spouse name: Not on file   Number of children: Not on file   Years of education: Not on file   Highest education level: Not on file  Occupational History   Not on file  Tobacco Use   Smoking status: Never   Smokeless tobacco: Never  Substance and Sexual Activity   Alcohol use: No   Drug use: No   Sexual  activity: Not on file  Other Topics Concern   Not on file  Social History Narrative   Not on file   Social Determinants of Health   Financial Resource Strain: Not on file  Food Insecurity: Not on file  Transportation Needs: Not on file  Physical Activity: Not on file  Stress: Not on file  Social Connections: Not on file    Social: active in his church and comes with his wife  Family History: The patient's family history includes Aneurysm (age of onset: 86) in his father; Diabetes in his sister.  Brain Aneurysm. No heart problems.  ROS:   Please see the history of present illness.     All other systems reviewed and are negative.  EKGs/Labs/Other Studies Reviewed:    The following studies were reviewed today:  EKG:   01/10/21: SR rate 82 inferior infarct  Cardiac Studies & Procedures       ECHOCARDIOGRAM  ECHOCARDIOGRAM COMPLETE 04/25/2022  Narrative ECHOCARDIOGRAM REPORT    Patient Name:   Alec Jacobson Date of Exam: 04/25/2022 Medical Rec #:  [id number]      Height:       69.0 in Accession #:    [id number]     Weight:       200.6 lb Date of Birth:  [birth year]     BSA:          2.069 m Patient Age:    76 years       BP:           123/76 mmHg Patient Gender: M              HR:           56 bpm. Exam Location:  [address]  Procedure: 2D Echo, 3D Echo, Cardiac Doppler and Color Doppler  Indications:    R01.1 Murmur  History:        Patient has no prior history of Echocardiogram examinations. Signs/Symptoms:Murmur and Edema; Risk Factors:Hypertension and Diabetes.  Sonographer:    Farrel Conners RDCS Referring Phys: [facility] A Owyn Raulston  IMPRESSIONS   1. Left ventricular ejection fraction, by estimation, is 60 to 65%. Left ventricular ejection fraction by 3D volume is 64 %. The left ventricle has normal function. The left ventricle has no regional wall motion abnormalities. There is mild left ventricular hypertrophy of the basal-septal segment. Left  ventricular diastolic parameters are consistent with Grade I diastolic dysfunction (impaired relaxation). 2. Right ventricular systolic function is normal. The right ventricular size is normal. There is normal pulmonary  artery systolic pressure. 3. The mitral valve is normal in structure. Trivial mitral valve regurgitation. 4. The aortic valve is tricuspid. Aortic valve regurgitation is mild. Aortic valve sclerosis is present, with no evidence of aortic valve stenosis. 5. The inferior vena cava is normal in size with greater than 50% respiratory variability, suggesting right atrial pressure of 3 mmHg.  Comparison(s): No prior Echocardiogram.  FINDINGS Left Ventricle: Left ventricular ejection fraction, by estimation, is 60 to 65%. Left ventricular ejection fraction by 3D volume is 64 %. The left ventricle has normal function. The left ventricle has no regional wall motion abnormalities. The left ventricular internal cavity size was normal in size. There is mild left ventricular hypertrophy of the basal-septal segment. Left ventricular diastolic parameters are consistent with Grade I diastolic dysfunction (impaired relaxation).  Right Ventricle: The right ventricular size is normal. No increase in right ventricular wall thickness. Right ventricular systolic function is normal. There is normal pulmonary artery systolic pressure. The tricuspid regurgitant velocity is 2.21 m/s, and with an assumed right atrial pressure of 3 mmHg, the estimated right ventricular systolic pressure is 22.5 mmHg.  Left Atrium: Left atrial size was normal in size.  Right Atrium: Right atrial size was normal in size.  Pericardium: There is no evidence of pericardial effusion.  Mitral Valve: The mitral valve is normal in structure. Trivial mitral valve regurgitation.  Tricuspid Valve: The tricuspid valve is normal in structure. Tricuspid valve regurgitation is trivial.  Aortic Valve: The aortic valve is tricuspid.  Aortic valve regurgitation is mild. Aortic regurgitation PHT measures 903 msec. Aortic valve sclerosis is present, with no evidence of aortic valve stenosis.  Pulmonic Valve: The pulmonic valve was normal in structure. Pulmonic valve regurgitation is mild.  Aorta: The aortic root and ascending aorta are structurally normal, with no evidence of dilitation.  Venous: The inferior vena cava is normal in size with greater than 50% respiratory variability, suggesting right atrial pressure of 3 mmHg.  IAS/Shunts: The atrial septum is grossly normal.   LEFT VENTRICLE PLAX 2D LVIDd:         4.80 cm         Diastology LVIDs:         2.80 cm         LV e' medial:    10.80 cm/s LV PW:         1.00 cm         LV E/e' medial:  6.9 LV IVS:        1.00 cm         LV e' lateral:   9.32 cm/s LVOT diam:     2.10 cm         LV E/e' lateral: 8.0 LV SV:         84 LV SV Index:   41 LVOT Area:     3.46 cm        3D Volume EF LV 3D EF:    Left ventricul ar ejection fraction by 3D volume is 64 %.  3D Volume EF: 3D EF:        64 % LV EDV:       134 ml LV ESV:       48 ml LV SV:        86 ml  RIGHT VENTRICLE RV Basal diam:  3.20 cm TAPSE (M-mode): 2.4 cm  LEFT ATRIUM             Index  RIGHT ATRIUM           Index LA diam:        4.40 cm 2.13 cm/m   RA Area:     12.90 cm LA Vol (A2C):   50.7 ml 24.51 ml/m  RA Volume:   28.90 ml  13.97 ml/m LA Vol (A4C):   51.1 ml 24.70 ml/m LA Biplane Vol: 51.5 ml 24.90 ml/m AORTIC VALVE LVOT Vmax:   106.50 cm/s LVOT Vmean:  67.900 cm/s LVOT VTI:    0.242 m AI PHT:      903 msec  AORTA Ao Root diam: 3.30 cm Ao Asc diam:  3.70 cm  MITRAL VALVE               TRICUSPID VALVE MV Area (PHT): cm         TR Peak grad:   19.5 mmHg MV Decel Time: 245 msec    TR Vmax:        221.00 cm/s MV E velocity: 74.25 cm/s MV A velocity: 79.85 cm/s  SHUNTS MV E/A ratio:  0.93        Systemic VTI:  0.24 m Systemic Diam: 2.10 cm  Laurance Flatten  MD Electronically signed by Laurance Flatten MD Signature Date/Time: 04/25/2022/12:23:27 PM    Final     CT SCANS  CT CORONARY MORPH W/CTA COR W/SCORE 01/19/2021  Addendum 01/19/2021 12:37 PM ADDENDUM REPORT: 01/19/2021 12:35  CLINICAL DATA:  77 year old with chest pain  EXAM: Cardiac/Coronary  CTA  TECHNIQUE: The patient was scanned on a [employer].  FINDINGS: A 100 kV prospective scan was triggered in the descending thoracic aorta at 111 HU's. Axial non-contrast 3 mm slices were carried out through the heart. The data set was analyzed on a dedicated work station and scored using the Agatson method. Gantry rotation speed was 250 msecs and collimation was .6 mm. No beta blockade and 0.8 mg of sl NTG was given. The 3D data set was reconstructed in 5% intervals of the 67-82 % of the R-R cycle. Diastolic phases were analyzed on a dedicated work station using MPR, MIP and VRT modes. The patient received 80 cc of contrast.  Aorta:  Normal size.  Aortic atherosclerosis.  No dissection.  Aortic Valve:  Trileaflet.  No calcifications.  Coronary Arteries:  Normal coronary origin.  Right dominance.  RCA is a large dominant artery that gives rise to PDA and PLA. There is non calcified proximal stenosis of 50-69%. FFR is normal 0.84.  Left main is a large, short artery that gives rise to LAD and LCX arteries.  LAD is a large vessel that has proximal and mid calcified plaque. Proximal 0-24% stenosis. Mid just beyond first diagonal, mixed plaque with 25-49% stenosis. FFR is normal 0.82 just distal to lesion. As LAD continues FFR decreases gradually to 0.70.  LCX is a non-dominant artery that gives rise to one large OM1 branch. There is no plaque. OM 2 is a small caliber vessel with mid 75-99% stenosis. FFR too small caliber to model.  Other findings:  Normal pulmonary vein drainage into the left atrium.  Normal left atrial appendage without a  thrombus.  Normal size of the pulmonary artery.  Please see radiology report for non cardiac findings.  IMPRESSION: 1. Coronary calcium score of 201 (LAD). This was 42 percentile for age and sex matched control.  2. Normal coronary origin with right dominance.  3. Moderate RCA proximal stenosis, mild to moderate mid LAD stenosis -  FFR is normal. Small caliber OM2 stenosis 75-99% stenosis mid vessel, too small for PCI.  4.  Aortic atherosclerosis.   Electronically Signed By: Donato Schultz M.D. On: 01/19/2021 12:35  Narrative EXAM: OVER-READ INTERPRETATION  CT CHEST  The following report is an over-read performed by radiologist Dr. Trudie Reed of [facility] on 01/19/2021. This over-read does not include interpretation of cardiac or coronary anatomy or pathology. The coronary calcium score/coronary CTA interpretation by the cardiologist is attached.  COMPARISON:  None.  FINDINGS: Atherosclerotic calcifications in the thoracic aorta. Within the visualized portions of the thorax there are no suspicious appearing pulmonary nodules or masses, there is no acute consolidative airspace disease, no pleural effusions, no pneumothorax and no lymphadenopathy. Visualized portions of the upper abdomen straight demonstrates several small low-attenuation lesions in the visualized hepatic parenchyma, incompletely characterized on today's examination, but statistically likely to represent tiny cysts. There are no aggressive appearing lytic or blastic lesions noted in the visualized portions of the skeleton.  IMPRESSION: 1.  Aortic Atherosclerosis (ICD10-I70.0).  Electronically Signed: By: Trudie Reed M.D. On: 01/19/2021 10:05            Recent Labs: 03/24/2022: TSH 2.513 03/26/2022: Hemoglobin 14.4; Platelets 140 03/29/2022: Magnesium 1.7; NT-Pro BNP 59 04/16/2022: BUN 27; Creatinine, Ser 0.91; Potassium 4.3; Sodium 139  Recent Lipid Panel    Component  Value Date/Time   CHOL 98 04/12/2022 1206   CHOL 117 04/19/2021 0953   TRIG 98.0 04/12/2022 1206   HDL 41.40 04/12/2022 1206   HDL 36 (L) 04/19/2021 0953   CHOLHDL 2 04/12/2022 1206   VLDL 19.6 04/12/2022 1206   LDLCALC 37 04/12/2022 1206   LDLCALC 63 04/19/2021 0953   LDLDIRECT 37 03/29/2022 0838     Physical Exam:    VS:  BP (!) 136/58   Pulse (!) 58   Ht 5\' 8"  (1.727 m)   Wt 209 lb (94.8 kg)   SpO2 97%   BMI 31.78 kg/m     Wt Readings from Last 3 Encounters:  07/22/22 209 lb (94.8 kg)  07/18/22 206 lb (93.4 kg)  04/12/22 200 lb 9.6 oz (91 kg)    Gen: no distress   Neck: No JVD,  Ears: Left ear Homero Fellers Sign Cardiac: No rubs or gallops,systolic crescendo murmur normal rhythm +2 radial pulses  Respiratory: Clear to auscultation bilaterally, normal effort, normal  respiratory rate GI: Soft, nontender, non-distended  MS: trace bilateral edema;  moves all extremities Integument: Skin feels warm Neuro:  At time of evaluation, alert and oriented to person/place/time/situation  Psych: Normal affect, patient feels well   ASSESSMENT:    1. Essential hypertension   2. Type 2 diabetes mellitus with hyperglycemia, with long-term current use of insulin (HCC)   3. Aortic atherosclerosis (HCC)   4. Coronary artery disease involving coronary bypass graft of native heart with angina pectoris (HCC)   5. Hyperlipidemia, unspecified hyperlipidemia type     PLAN:    HTM with DM LE Edema - continue norvasc at 5 mg - presently continue HCTZ and low dose MRA (had hypokalemia) - if AKI would consider stopping MRA rechecling labs and adding SGLT2i  Mild AI - echo in 2027  CAD Aortic Atherosclerosis & HLD - asymptomatic  - anatomy: mod RCA and OM2 is too small for intervention (not FFR modeled) - continue ASA 81 mg;  - on zetia and  rosuvastatin, goal LDL < 55 - continue BB (med mgmt for symptomatic CAD) - continue PRN  nitrates (none needed)  One year me or  Tessa  Medication Adjustments/Labs and Tests Ordered: Current medicines are reviewed at length with the patient today.  Concerns regarding medicines are outlined above.  Orders Placed This Encounter  Procedures   Basic metabolic panel     No orders of the defined types were placed in this encounter.     Patient Instructions  Medication Instructions:  Your physician recommends that you continue on your current medications as directed. Please refer to the Current Medication list given to you today.  *If you need a refill on your cardiac medications before your next appointment, please call your pharmacy*   Lab Work: TODAY:BMP  If you have labs (blood work) drawn today and your tests are completely normal, you will receive your results only by: MyChart Message (if you have MyChart) OR A paper copy in the mail If you have any lab test that is abnormal or we need to change your treatment, we will call you to review the results.   Testing/Procedures: NONE   Follow-Up: At [facility], you and your health needs are our priority.  As part of our continuing mission to provide you with exceptional heart care, we have created designated Provider Care Teams.  These Care Teams include your primary Cardiologist (physician) and Advanced Practice Providers (APPs -  Physician Assistants and Nurse Practitioners) who all work together to provide you with the care you need, when you need it.  We recommend signing up for the patient portal called "MyChart".  Sign up information is provided on this After Visit Summary.  MyChart is used to connect with patients for Virtual Visits (Telemedicine).  Patients are able to view lab/test results, encounter notes, upcoming appointments, etc.  Non-urgent messages can be sent to your provider as well.   To learn more about what you can do with MyChart, go to [email or web address].    Your next appointment:   1 year(s)  Provider:   Riley Lam, MD or Jari Favre, PA       Signed, Christell Constant, MD  07/22/2022 9:24 AM    [facility]

## 2022-07-22 NOTE — Patient Instructions (Signed)
Medication Instructions:  Your physician recommends that you continue on your current medications as directed. Please refer to the Current Medication list given to you today.  *If you need a refill on your cardiac medications before your next appointment, please call your pharmacy*   Lab Work: TODAY:BMP  If you have labs (blood work) drawn today and your tests are completely normal, you will receive your results only by: MyChart Message (if you have MyChart) OR A paper copy in the mail If you have any lab test that is abnormal or we need to change your treatment, we will call you to review the results.   Testing/Procedures: NONE   Follow-Up: At [facility], you and your health needs are our priority.  As part of our continuing mission to provide you with exceptional heart care, we have created designated Provider Care Teams.  These Care Teams include your primary Cardiologist (physician) and Advanced Practice Providers (APPs -  Physician Assistants and Nurse Practitioners) who all work together to provide you with the care you need, when you need it.  We recommend signing up for the patient portal called "MyChart".  Sign up information is provided on this After Visit Summary.  MyChart is used to connect with patients for Virtual Visits (Telemedicine).  Patients are able to view lab/test results, encounter notes, upcoming appointments, etc.  Non-urgent messages can be sent to your provider as well.   To learn more about what you can do with MyChart, go to [email or web address].    Your next appointment:   1 year(s)  Provider:   Riley Lam, MD or Jari Favre, [place]

## 2022-07-23 LAB — BASIC METABOLIC PANEL
BUN/Creatinine Ratio: 25 — ABNORMAL HIGH (ref 10–24)
BUN: 25 mg/dL (ref 8–27)
CO2: 22 mmol/L (ref 20–29)
Calcium: 9.3 mg/dL (ref 8.6–10.2)
Chloride: 101 mmol/L (ref 96–106)
Creatinine, Ser: 1.02 mg/dL (ref 0.76–1.27)
Glucose: 208 mg/dL — ABNORMAL HIGH (ref 70–99)
Potassium: 4.1 mmol/L (ref 3.5–5.2)
Sodium: 139 mmol/L (ref 134–144)
eGFR: 77 mL/min/{1.73_m2} (ref >59)

## 2022-08-14 ENCOUNTER — Other Ambulatory Visit: Payer: Self-pay | Admitting: Internal Medicine

## 2022-11-20 NOTE — Progress Notes (Signed)
Name: Alec Jacobson  Age/ Sex: 77 y.o., male   MRN/ DOB: [id number], [birth year]     PCP: Alec Shelter, MD   Reason for Endocrinology Evaluation: Type 2 Diabetes Mellitus  Initial Endocrine Consultative Visit: 05/24/2015    PATIENT IDENTIFIER: Alec Jacobson is a 77 y.o. male with a past medical history of CAD, DM, dyslipidemia . The patient has followed with Endocrinology clinic since 05/24/2015 for consultative assistance with management of his diabetes.  DIABETIC HISTORY:  Alec Jacobson was diagnosed with T1DM in 2017. His hemoglobin A1c has ranged from 5.8 % in 2018, peaking at 14.9% in 2021.   GLP-1 agonists were cost prohibitive  He was followed by Dr. Everardo All from 2017 until 05/2021  On his initial visit with me had an A1c of 10.0%, he was on basal insulin only, metformin started 10/2021  SUBJECTIVE:    During the last visit (07/18/2022): A1c 7.8%  Today (11/21/2022): Alec Jacobson is here for a follow up on diabetes management. He is accompanied by his spouse today. He checks his blood sugars multiples times daily,through CGM   Patient continues to follow-up with cardiology for HTN  Denies nausea, vomiting  Denies constipation or diarrhea   HOME DIABETES REGIMEN:  Metformin 500 mg, 2 tabs daily Lantus 52 units daily  Novolog per correction scale (BG-125/30)    Statin: yes ACE-I/ARB: yes   CONTINUOUS GLUCOSE MONITORING RECORD INTERPRETATION    Dates of Recording: 8/12-8/25/2024  Sensor description:dexcom  Results statistics:   CGM use % of time 100  Average and SD 175/55  Time in range   60 %  % Time Above 180 32  % Time above 250 8  % Time Below target <1   Glycemic patterns summary: BG's trend down overnight and fluctuate during the day  Hyperglycemic episodes  postprandial   Hypoglycemic episodes occurred at night   Overnight periods: trends down         DIABETIC COMPLICATIONS: Microvascular complications:   Denies: CKD  Last  Eye Exam: Completed 2022  Macrovascular complications:  CAD Denies:  CVA, PVD   HISTORY:  Past Medical History:  Past Medical History:  Diagnosis Date   Cancer (HCC)    prostate   Chronic kidney disease    over 30 yrs ago, had one stone   Diabetes mellitus without complication (HCC)    H/O allergic rhinitis    job related   Hypertension    Past Surgical History:  Past Surgical History:  Procedure Laterality Date   NASAL SINUS SURGERY     SINUS ENDO WITH FUSION Bilateral 04/13/2014   Procedure: SINUS ENDO WITH FUSION;  Surgeon: Melvenia Beam, MD;  Location: [facility];  Service: ENT;  Laterality: Bilateral;   TONSILLECTOMY     Social History:  reports that he has never smoked. He has never used smokeless tobacco. He reports that he does not drink alcohol and does not use drugs. Family History:  Family History  Problem Relation Age of Onset   Aneurysm Father 2   Diabetes Sister      HOME MEDICATIONS: Allergies as of 11/21/2022   No Known Allergies      Medication List        Accurate as of November 21, 2022 12:18 PM. If you have any questions, ask your nurse or doctor.          amLODipine 5 MG tablet Commonly known as: NORVASC Take 1 tablet (5 mg total) by mouth daily.  aspirin EC 81 MG tablet Take 81 mg by mouth daily. Swallow whole.   BD Pen Needle Nano U/F 32G X 4 MM Misc Generic drug: Insulin Pen Needle 1 Device by Other route in the morning, at noon, in the evening, and at bedtime. USE WITH INSULIN PENS TO  DISPENSE INSULIN DAILY AS DIRECTED   blood glucose meter kit and supplies Dispense based on patient and insurance preference. Use up to four times daily as directed. (FOR ICD-9 250.00, 250.01).   ezetimibe 10 MG tablet Commonly known as: ZETIA TAKE 1 TABLET BY MOUTH EVERY DAY   hydrochlorothiazide 25 MG tablet Commonly known as: HYDRODIURIL Take 25 mg by mouth daily.   Lantus SoloStar 100 UNIT/ML Solostar Pen Generic drug: insulin  glargine Inject 52 Units into the skin daily.   metFORMIN 500 MG 24 hr tablet Commonly known as: GLUCOPHAGE-XR Take 2 tablets (1,000 mg total) by mouth daily with breakfast.   metoprolol succinate 25 MG 24 hr tablet Commonly known as: TOPROL-XL TAKE 1 TABLET BY MOUTH ONCE  DAILY   nitroGLYCERIN 0.4 MG SL tablet Commonly known as: NITROSTAT Place 1 tablet (0.4 mg total) under the tongue every 5 (five) minutes as needed for chest pain (Take this medicine by mouth as needed. At the first sign of chest pain or tightness place one tablet under your tongue. Let the tablet dissolve under the tongue. Do not swallow whole.  Do not eat or drink, smoke while a tablet is dissolving. If you are not better within 5 minutes after taking ONE dose of nitroglycerin, you may take a SECOND dose. IF YOU ARE GETTING TO THE 3RD DOSE CALL 911.  Do not take more than 3 nitroglycerin tablet.).   NovoLOG FlexPen 100 UNIT/ML FlexPen Generic drug: insulin aspart Inject with meals SQ as directed up to 30 units max daily dose.   rosuvastatin 40 MG tablet Commonly known as: CRESTOR TAKE 1 TABLET BY MOUTH EVERY DAY   spironolactone 25 MG tablet Commonly known as: Aldactone Take 0.5 tablets (12.5 mg total) by mouth daily.         OBJECTIVE:   Vital Signs: BP 120/70 (BP Location: Left Arm, Patient Position: Sitting, Cuff Size: Large)   Pulse 60   Ht 5\' 8"  (1.727 m)   Wt 208 lb (94.3 kg)   SpO2 97%   BMI 31.63 kg/m   Wt Readings from Last 3 Encounters:  11/21/22 208 lb (94.3 kg)  07/22/22 209 lb (94.8 kg)  07/18/22 206 lb (93.4 kg)     Exam: General: Pt appears well and is in NAD  Lungs: Clear with good BS bilat   Heart: RRR  Extremities: No pretibial edema. .  Neuro: MS is good with appropriate affect, pt is alert and Ox3    DM foot exam: 11/21/2022  The skin of the feet is intact without sores or ulcerations. The pedal pulses are 2+ on right and 2+ on left. The sensation is intact to a  screening 5.07, 10 gram monofilament bilaterally          DATA REVIEWED:  Lab Results  Component Value Date   HGBA1C 8.9 (A) 11/21/2022   HGBA1C 7.8 (A) 07/18/2022   HGBA1C >15.5 (H) 03/24/2022    Latest Reference Range & Units 03/29/22 08:38  Sodium 134 - 144 mmol/L 138  Potassium 3.5 - 5.2 mmol/L 3.6  Chloride 96 - 106 mmol/L 98  CO2 20 - 29 mmol/L 27  Glucose 70 - 99 mg/dL 409 (H)  BUN 8 - 27 mg/dL 16  Creatinine 1.32 - 4.40 mg/dL 1.02  Calcium 8.6 - 72.5 mg/dL 9.2  BUN/Creatinine Ratio 10 - 24  17  eGFR >59 mL/min/1.73 85  Magnesium 1.6 - 2.3 mg/dL 1.7    Latest Reference Range & Units 04/12/22 12:06  Total CHOL/HDL Ratio  2  Cholesterol 0 - 200 mg/dL 98  HDL Cholesterol >36.64 mg/dL 40.34  LDL (calc) 0 - 99 mg/dL 37  MICROALB/CREAT RATIO 0.0 - 30.0 mg/g 18.1  NonHDL  56.50  Triglycerides 0.0 - 149.0 mg/dL 74.2  VLDL 0.0 - 59.5 mg/dL 63.8    Latest Reference Range & Units 04/12/22 12:06  Creatinine,U mg/dL 756.4  Microalb, Ur 0.0 - 1.9 mg/dL 33.2 (H)  MICROALB/CREAT RATIO 0.0 - 30.0 mg/g 18.1     ASSESSMENT / PLAN / RECOMMENDATIONS:   1) Type 2 Diabetes Mellitus, poorly  controlled, With macrovascular  complications - Most recent A1c of 8.9%. Goal A1c < 7.0 %.     -A1c has trended up from 7.8% to 8.9% - GAD-65 and Islet cell Ab's are negative  - GLP-1 agonists  cost prohibitive , I offered PA program forms but he opted to hold off at the time - NOT a candidate for SGLT-2i due to DKA 02/2022 -I will add a standing dose of prandial insulin with each meal in addition to using the correction scale if needed for hyperglycemia -I will decrease basal insulin due to hypoglycemia overnight on CGM download -We also discussed options for low-carb breakfast items, and avoiding snacks as much as possible  MEDICATIONS: Continue Metformin 500 mg XR, 2 tabs daily  Decrease  Lantus 50 units daily  Take NovoLog 4 units 3 times daily before every meal Continue Novolog  per correction scale (BG-125/30) TIDQAC and QHS  EDUCATION / INSTRUCTIONS: BG monitoring instructions: Patient is instructed to check his blood sugars 1 times a day. Call [facility] if: BG persistently < 70  I reviewed the Rule of 15 for the treatment of hypoglycemia in detail with the patient. Literature supplied.    2) Diabetic complications:  Eye: Does not have known diabetic retinopathy.  Neuro/ Feet: Does not have known diabetic peripheral neuropathy .  Renal: Patient does not have known baseline CKD. He   is not on an ACEI/ARB at present.    F/U in 4 months      Signed electronically by: Lyndle Herrlich, MD  [facility]  [facility] [address] [place], [place] [address] Phone: [phone] FAX: [phone]   CC: Alec Shelter, MD [address] [facility] [place] [address] Phone: [phone]  Fax: [phone]  Return to Endocrinology clinic as below: No future appointments.

## 2022-11-21 ENCOUNTER — Encounter: Payer: Self-pay | Admitting: Internal Medicine

## 2022-11-21 ENCOUNTER — Ambulatory Visit: Payer: Medicare Other | Admitting: Internal Medicine

## 2022-11-21 VITALS — BP 120/70 | HR 60 | Ht 68.0 in | Wt 208.0 lb

## 2022-11-21 DIAGNOSIS — Z7984 Long term (current) use of oral hypoglycemic drugs: Secondary | ICD-10-CM

## 2022-11-21 DIAGNOSIS — Z794 Long term (current) use of insulin: Secondary | ICD-10-CM | POA: Diagnosis not present

## 2022-11-21 DIAGNOSIS — E1165 Type 2 diabetes mellitus with hyperglycemia: Secondary | ICD-10-CM

## 2022-11-21 LAB — POCT GLYCOSYLATED HEMOGLOBIN (HGB A1C): Hemoglobin A1C: 8.9 % — AB (ref 4.0–5.6)

## 2022-11-21 NOTE — Patient Instructions (Signed)
-   Continue Metformin 500 mg two tablets daily  - Decrease Lantus to 50 units daily  - Take Novolog 4 units with each meal  - Novolog correctional insulin: Use the scale below to help guide you BEFORE each meal   Blood sugar before meal Number of units to inject  Less than 155 0 unit  156 -  185 1 units  186 -  215 2 units  216 -  245 3 units  246 -  275 4 units  276 -  305 5 units  306 -  335 6 units  336 -  365 7 units  366 -  395 8 units   HOW TO TREAT LOW BLOOD SUGARS (Blood sugar LESS THAN 70 MG/DL) Please follow the RULE OF 15 for the treatment of hypoglycemia treatment (when your (blood sugars are less than 70 mg/dL)   STEP 1: Take 15 grams of carbohydrates when your blood sugar is low, which includes:  3-4 GLUCOSE TABS  OR 3-4 OZ OF JUICE OR REGULAR SODA OR ONE TUBE OF GLUCOSE GEL    STEP 2: RECHECK blood sugar in 15 MINUTES STEP 3: If your blood sugar is still low at the 15 minute recheck --> then, go back to STEP 1 and treat AGAIN with another 15 grams of carbohydrates.

## 2022-11-22 DIAGNOSIS — Z7984 Long term (current) use of oral hypoglycemic drugs: Secondary | ICD-10-CM | POA: Insufficient documentation

## 2022-11-22 MED ORDER — LANTUS SOLOSTAR 100 UNIT/ML ~~LOC~~ SOPN: 50.0000 [IU] | PEN_INJECTOR | Freq: Every day | SUBCUTANEOUS | 4 refills | Status: DC

## 2022-11-22 MED ORDER — NOVOLOG FLEXPEN 100 UNIT/ML ~~LOC~~ SOPN: PEN_INJECTOR | SUBCUTANEOUS | 3 refills | Status: DC

## 2022-11-22 MED ORDER — BD PEN NEEDLE NANO U/F 32G X 4 MM MISC: 1.0000 | Freq: Four times a day (QID) | 3 refills | Status: DC

## 2022-11-22 MED ORDER — METFORMIN HCL ER 500 MG PO TB24: 1000.0000 mg | ORAL_TABLET | Freq: Every day | ORAL | 3 refills | Status: DC

## 2022-12-13 ENCOUNTER — Other Ambulatory Visit: Payer: Self-pay | Admitting: Internal Medicine

## 2022-12-31 ENCOUNTER — Other Ambulatory Visit: Payer: Self-pay

## 2022-12-31 MED ORDER — SPIRONOLACTONE 25 MG PO TABS: 12.5000 mg | ORAL_TABLET | Freq: Every day | ORAL | 1 refills | Status: DC

## 2023-02-15 ENCOUNTER — Other Ambulatory Visit: Payer: Self-pay | Admitting: Internal Medicine

## 2023-02-15 DIAGNOSIS — E1165 Type 2 diabetes mellitus with hyperglycemia: Secondary | ICD-10-CM

## 2023-03-12 NOTE — Progress Notes (Signed)
Name: Alec Jacobson  Age/ Sex: 77 y.o., male   MRN/ DOB: [id number], 1946/01/17     PCP: Corine Shelter, MD   Reason for Endocrinology Evaluation: Type 2 Diabetes Mellitus  Initial Endocrine Consultative Visit: 05/24/2015    PATIENT IDENTIFIER: Mr. CHESKEL Jacobson is a 77 y.o. male with a past medical history of CAD, DM, dyslipidemia . The patient has followed with Endocrinology clinic since 05/24/2015 for consultative assistance with management of his diabetes.  DIABETIC HISTORY:  Mr. Treharne was diagnosed with T1DM in 2017. His hemoglobin A1c has ranged from 5.8 % in 2018, peaking at 14.9% in 2021.   GLP-1 agonists were cost prohibitive  He was followed by Dr. Everardo All from 2017 until 05/2021  On his initial visit with me had an A1c of 10.0%, he was on basal insulin only, metformin started 10/2021  SUBJECTIVE:  During the last visit (11/21/2022): A1c 8.9%   Today (03/20/2023): Alec Jacobson is here for a follow up on diabetes management. He is accompanied by his spouse today. He checks his blood sugars multiples times daily,through CGM   Patient continues to follow-up with cardiology for HTN  Denies nausea, vomiting  Denies changes in bowel movements    HOME DIABETES REGIMEN:  Metformin 500 mg, 2 tabs daily Lantus 50 units daily - takes 40 units  Novolog 4 units TIDQAC Novolog per correction scale (BG-125/30)    Statin: yes ACE-I/ARB: yes   CONTINUOUS GLUCOSE MONITORING RECORD INTERPRETATION: Did not bring the receiver     DIABETIC COMPLICATIONS: Microvascular complications:   Denies: CKD  Last Eye Exam: Completed 2022  Macrovascular complications:  CAD Denies:  CVA, PVD   HISTORY:  Past Medical History:  Past Medical History:  Diagnosis Date   Cancer (HCC)    prostate   Chronic kidney disease    over 30 yrs ago, had one stone   Diabetes mellitus without complication (HCC)    H/O allergic rhinitis    job related   Hypertension    Past  Surgical History:  Past Surgical History:  Procedure Laterality Date   NASAL SINUS SURGERY     SINUS ENDO WITH FUSION Bilateral 04/13/2014   Procedure: SINUS ENDO WITH FUSION;  Surgeon: Melvenia Beam, MD;  Location: [facility] OR;  Service: ENT;  Laterality: Bilateral;   TONSILLECTOMY     Social History:  reports that he has never smoked. He has never used smokeless tobacco. He reports that he does not drink alcohol and does not use drugs. Family History:  Family History  Problem Relation Age of Onset   Aneurysm Father 12   Diabetes Sister      HOME MEDICATIONS: Allergies as of 03/20/2023   No Known Allergies      Medication List        Accurate as of March 20, 2023  8:43 AM. If you have any questions, ask your nurse or doctor.          amLODipine 5 MG tablet Commonly known as: NORVASC Take 1 tablet (5 mg total) by mouth daily.   aspirin EC 81 MG tablet Take 81 mg by mouth daily. Swallow whole.   BD Pen Needle Nano U/F 32G X 4 MM Misc Generic drug: Insulin Pen Needle 1 Device by Other route in the morning, at noon, in the evening, and at bedtime. USE WITH INSULIN PENS TO  DISPENSE INSULIN DAILY AS DIRECTED   blood glucose meter kit and supplies Dispense based on patient and insurance preference. Use  up to four times daily as directed. (FOR ICD-9 250.00, 250.01).   ezetimibe 10 MG tablet Commonly known as: ZETIA TAKE 1 TABLET BY MOUTH EVERY DAY   hydrochlorothiazide 25 MG tablet Commonly known as: HYDRODIURIL Take 25 mg by mouth daily.   Lantus SoloStar 100 UNIT/ML Solostar Pen Generic drug: insulin glargine Inject 50 Units into the skin daily.   metFORMIN 500 MG 24 hr tablet Commonly known as: GLUCOPHAGE-XR TAKE 2 TABLETS BY MOUTH DAILY  WITH BREAKFAST   metoprolol succinate 25 MG 24 hr tablet Commonly known as: TOPROL-XL TAKE 1 TABLET BY MOUTH ONCE  DAILY   nitroGLYCERIN 0.4 MG SL tablet Commonly known as: NITROSTAT Place 1 tablet (0.4 mg total) under  the tongue every 5 (five) minutes as needed for chest pain (Take this medicine by mouth as needed. At the first sign of chest pain or tightness place one tablet under your tongue. Let the tablet dissolve under the tongue. Do not swallow whole.  Do not eat or drink, smoke while a tablet is dissolving. If you are not better within 5 minutes after taking ONE dose of nitroglycerin, you may take a SECOND dose. IF YOU ARE GETTING TO THE 3RD DOSE CALL 911.  Do not take more than 3 nitroglycerin tablet.).   NovoLOG FlexPen 100 UNIT/ML FlexPen Generic drug: insulin aspart Max daily 30 units   rosuvastatin 40 MG tablet Commonly known as: CRESTOR TAKE 1 TABLET BY MOUTH DAILY   spironolactone 25 MG tablet Commonly known as: Aldactone Take 0.5 tablets (12.5 mg total) by mouth daily.         OBJECTIVE:   Vital Signs: BP 138/72   Pulse 67   Resp 16   Ht 5\' 8"  (1.727 m)   Wt 206 lb (93.4 kg)   SpO2 98%   BMI 31.32 kg/m   Wt Readings from Last 3 Encounters:  03/20/23 206 lb (93.4 kg)  11/21/22 208 lb (94.3 kg)  07/22/22 209 lb (94.8 kg)     Exam: General: Pt appears well and is in NAD  Lungs: Clear with good BS bilat   Heart: RRR  Extremities: No pretibial edema. .  Neuro: MS is good with appropriate affect, pt is alert and Ox3    DM foot exam: 03/20/2023  The skin of the feet is intact without sores or ulcerations. The pedal pulses are 2+ on right and 2+ on left. The sensation is intact to a screening 5.07, 10 gram monofilament bilaterally          DATA REVIEWED:  Lab Results  Component Value Date   HGBA1C 11.9 (A) 03/20/2023   HGBA1C 8.9 (A) 11/21/2022   HGBA1C 7.8 (A) 07/18/2022    Latest Reference Range & Units 03/29/22 08:38  Sodium 134 - 144 mmol/L 138  Potassium 3.5 - 5.2 mmol/L 3.6  Chloride 96 - 106 mmol/L 98  CO2 20 - 29 mmol/L 27  Glucose 70 - 99 mg/dL 846 (H)  BUN 8 - 27 mg/dL 16  Creatinine 9.62 - 9.52 mg/dL 8.41  Calcium 8.6 - 32.4 mg/dL 9.2   BUN/Creatinine Ratio 10 - 24  17  eGFR >59 mL/min/1.73 85  Magnesium 1.6 - 2.3 mg/dL 1.7    Latest Reference Range & Units 04/12/22 12:06  Total CHOL/HDL Ratio  2  Cholesterol 0 - 200 mg/dL 98  HDL Cholesterol >40.10 mg/dL 27.25  LDL (calc) 0 - 99 mg/dL 37  MICROALB/CREAT RATIO 0.0 - 30.0 mg/g 18.1  NonHDL  56.50  Triglycerides 0.0 - 149.0 mg/dL 52.8  VLDL 0.0 - 41.3 mg/dL 24.4    Latest Reference Range & Units 04/12/22 12:06  Creatinine,U mg/dL 010.2  Microalb, Ur 0.0 - 1.9 mg/dL 72.5 (H)  MICROALB/CREAT RATIO 0.0 - 30.0 mg/g 18.1     ASSESSMENT / PLAN / RECOMMENDATIONS:   1) Type 2 Diabetes Mellitus, poorly controlled, With macrovascular  complications - Most recent A1c of 11.9%. Goal A1c < 7.0 %.     -Patient has been noted worsening glycemic control, this is due to an accurate insulin regimen intake.  Unfortunately, he has been taken less Lantus than previously prescribed, and for the NovoLog it appears he has been using an old correction scale -Today we discussed the importance of taking Lantus 50 units once daily and to take NovoLog 4 units with each meal as a baseline and to add extra if Premeal BG >155 mg/dL  - DGU-44 and Islet cell Ab's are negative  - GLP-1 agonists  cost prohibitive , I offered PA program forms but he opted to hold off at the time - NOT a candidate for SGLT-2i due to DKA 02/2022  MEDICATIONS: Continue Metformin 500 mg XR, 2 tabs daily  Increase Lantus 50 units daily  Take NovoLog 4 units 3 times daily before every meal Continue Novolog per correction scale (BG-125/30) TIDQAC   EDUCATION / INSTRUCTIONS: BG monitoring instructions: Patient is instructed to check his blood sugars 1 times a day. Call [facility] if: BG persistently < 70  I reviewed the Rule of 15 for the treatment of hypoglycemia in detail with the patient. Literature supplied.    2) Diabetic complications:  Eye: Does not have known diabetic retinopathy.   Neuro/ Feet: Does not have known diabetic peripheral neuropathy .  Renal: Patient does not have known baseline CKD. He   is not on an ACEI/ARB at present.    F/U in 3 months      Signed electronically by: Lyndle Herrlich, MD  [facility]  [facility] [address] [place], [place] [address] Phone: [phone] FAX: [phone]   CC: Corine Shelter, MD [address] [place] [address] Phone: [phone]  Fax: [phone]  Return to Endocrinology clinic as below: No future appointments.

## 2023-03-20 ENCOUNTER — Encounter: Payer: Self-pay | Admitting: Internal Medicine

## 2023-03-20 ENCOUNTER — Ambulatory Visit: Payer: Medicare Other | Admitting: Internal Medicine

## 2023-03-20 VITALS — BP 138/72 | HR 67 | Resp 16 | Ht 68.0 in | Wt 206.0 lb

## 2023-03-20 DIAGNOSIS — E1159 Type 2 diabetes mellitus with other circulatory complications: Secondary | ICD-10-CM

## 2023-03-20 DIAGNOSIS — E1165 Type 2 diabetes mellitus with hyperglycemia: Secondary | ICD-10-CM | POA: Diagnosis not present

## 2023-03-20 DIAGNOSIS — Z794 Long term (current) use of insulin: Secondary | ICD-10-CM | POA: Diagnosis not present

## 2023-03-20 LAB — POCT GLUCOSE (DEVICE FOR HOME USE): Glucose Fasting, POC: 191 mg/dL — AB (ref 70–99)

## 2023-03-20 LAB — POCT GLYCOSYLATED HEMOGLOBIN (HGB A1C): Hemoglobin A1C: 11.9 % — AB (ref 4.0–5.6)

## 2023-03-20 NOTE — Patient Instructions (Addendum)
-   Continue Metformin 500 mg two tablets daily  - Increase Lantus  50 units daily  - Take Novolog 4 units with each meal  - Novolog correctional insulin: Use the scale below to help guide you BEFORE each meal   Blood sugar before meal Number of units to inject  Less than 155 0 unit  156 -  185 1 units  186 -  215 2 units  216 -  245 3 units  246 -  275 4 units  276 -  305 5 units  306 -  335 6 units  336 -  365 7 units  366 -  395 8 units   HOW TO TREAT LOW BLOOD SUGARS (Blood sugar LESS THAN 70 MG/DL) Please follow the RULE OF 15 for the treatment of hypoglycemia treatment (when your (blood sugars are less than 70 mg/dL)   STEP 1: Take 15 grams of carbohydrates when your blood sugar is low, which includes:  3-4 GLUCOSE TABS  OR 3-4 OZ OF JUICE OR REGULAR SODA OR ONE TUBE OF GLUCOSE GEL    STEP 2: RECHECK blood sugar in 15 MINUTES STEP 3: If your blood sugar is still low at the 15 minute recheck --> then, go back to STEP 1 and treat AGAIN with another 15 grams of carbohydrates.

## 2023-05-24 ENCOUNTER — Other Ambulatory Visit: Payer: Self-pay | Admitting: Internal Medicine

## 2023-06-18 ENCOUNTER — Ambulatory Visit: Payer: Medicare Other | Admitting: Internal Medicine

## 2023-06-18 NOTE — Progress Notes (Deleted)
 Name: Alec Jacobson  Age/ Sex: 78 y.o., male   MRN/ DOB: [id number], Nov 28, 1945     PCP: Corine Shelter, MD   Reason for Endocrinology Evaluation: Type 2 Diabetes Mellitus  Initial Endocrine Consultative Visit: 05/24/2015    PATIENT IDENTIFIER: [name] is a 78 y.o. male with a past medical history of CAD, DM, dyslipidemia . The patient has followed with Endocrinology clinic since 05/24/2015 for consultative assistance with management of his diabetes.  DIABETIC HISTORY:  Alec Jacobson was diagnosed with T1DM in 2017. His hemoglobin A1c has ranged from 5.8 % in 2018, peaking at 14.9% in 2021.   GLP-1 agonists were cost prohibitive  He was followed by Dr. Everardo All from 2017 until 05/2021  On his initial visit with me had an A1c of 10.0%, he was on basal insulin only, metformin started 10/2021  SUBJECTIVE:   During the last visit (02/28/2023): A1c 11.9%   Today (06/18/2023): Alec Jacobson is here for a follow up on diabetes management. He is accompanied by his spouse today. He checks his blood sugars multiples times daily,through CGM   Patient continues to follow-up with cardiology for HTN  Denies nausea, vomiting  Denies changes in bowel movements    HOME DIABETES REGIMEN:  Metformin 500 mg, 2 tabs daily Lantus 50 units daily  Novolog 4 units TIDQAC Novolog per correction scale (BG-125/30)    Statin: yes ACE-I/ARB: yes   CONTINUOUS GLUCOSE MONITORING RECORD INTERPRETATION: Did not bring the receiver     DIABETIC COMPLICATIONS: Microvascular complications:   Denies: CKD  Last Eye Exam: Completed 2022  Macrovascular complications:  CAD Denies:  CVA, PVD   HISTORY:  Past Medical History:  Past Medical History:  Diagnosis Date   Cancer (HCC)    prostate   Chronic kidney disease    over 30 yrs ago, had one stone   Diabetes mellitus without complication (HCC)    H/O allergic rhinitis    job related   Hypertension    Past Surgical History:   Past Surgical History:  Procedure Laterality Date   NASAL SINUS SURGERY     SINUS ENDO WITH FUSION Bilateral 04/13/2014   Procedure: SINUS ENDO WITH FUSION;  Surgeon: Melvenia Beam, MD;  Location: [facility] OR;  Service: ENT;  Laterality: Bilateral;   TONSILLECTOMY     Social History:  reports that he has never smoked. He has never used smokeless tobacco. He reports that he does not drink alcohol and does not use drugs. Family History:  Family History  Problem Relation Age of Onset   Aneurysm Father 34   Diabetes Sister      HOME MEDICATIONS: Allergies as of 06/18/2023   No Known Allergies      Medication List        Accurate as of June 18, 2023  6:49 AM. If you have any questions, ask your nurse or doctor.          amLODipine 5 MG tablet Commonly known as: NORVASC Take 1 tablet (5 mg total) by mouth daily.   aspirin EC 81 MG tablet Take 81 mg by mouth daily. Swallow whole.   BD Pen Needle Nano U/F 32G X 4 MM Misc Generic drug: Insulin Pen Needle 1 Device by Other route in the morning, at noon, in the evening, and at bedtime. USE WITH INSULIN PENS TO  DISPENSE INSULIN DAILY AS DIRECTED   blood glucose meter kit and supplies Dispense based on patient and insurance preference. Use up to four  times daily as directed. (FOR ICD-9 250.00, 250.01).   ezetimibe 10 MG tablet Commonly known as: ZETIA TAKE 1 TABLET BY MOUTH EVERY DAY   hydrochlorothiazide 25 MG tablet Commonly known as: HYDRODIURIL Take 25 mg by mouth daily.   Lantus SoloStar 100 UNIT/ML Solostar Pen Generic drug: insulin glargine Inject 50 Units into the skin daily.   metFORMIN 500 MG 24 hr tablet Commonly known as: GLUCOPHAGE-XR TAKE 2 TABLETS BY MOUTH DAILY  WITH BREAKFAST   metoprolol succinate 25 MG 24 hr tablet Commonly known as: TOPROL-XL TAKE 1 TABLET BY MOUTH ONCE  DAILY   nitroGLYCERIN 0.4 MG SL tablet Commonly known as: NITROSTAT Place 1 tablet (0.4 mg total) under the tongue every 5  (five) minutes as needed for chest pain (Take this medicine by mouth as needed. At the first sign of chest pain or tightness place one tablet under your tongue. Let the tablet dissolve under the tongue. Do not swallow whole.  Do not eat or drink, smoke while a tablet is dissolving. If you are not better within 5 minutes after taking ONE dose of nitroglycerin, you may take a SECOND dose. IF YOU ARE GETTING TO THE 3RD DOSE CALL 911.  Do not take more than 3 nitroglycerin tablet.).   NovoLOG FlexPen 100 UNIT/ML FlexPen Generic drug: insulin aspart Max daily 30 units   rosuvastatin 40 MG tablet Commonly known as: CRESTOR TAKE 1 TABLET BY MOUTH DAILY   spironolactone 25 MG tablet Commonly known as: ALDACTONE TAKE ONE-HALF TABLET BY MOUTH  DAILY         OBJECTIVE:   Vital Signs: There were no vitals taken for this visit.  Wt Readings from Last 3 Encounters:  03/20/23 206 lb (93.4 kg)  11/21/22 208 lb (94.3 kg)  07/22/22 209 lb (94.8 kg)     Exam: General: Pt appears well and is in NAD  Lungs: Clear with good BS bilat   Heart: RRR  Extremities: No pretibial edema. .  Neuro: MS is good with appropriate affect, pt is alert and Ox3    DM foot exam: 03/20/2023  The skin of the feet is intact without sores or ulcerations. The pedal pulses are 2+ on right and 2+ on left. The sensation is intact to a screening 5.07, 10 gram monofilament bilaterally          DATA REVIEWED:  Lab Results  Component Value Date   HGBA1C 11.9 (A) 03/20/2023   HGBA1C 8.9 (A) 11/21/2022   HGBA1C 7.8 (A) 07/18/2022    Latest Reference Range & Units 03/29/22 08:38  Sodium 134 - 144 mmol/L 138  Potassium 3.5 - 5.2 mmol/L 3.6  Chloride 96 - 106 mmol/L 98  CO2 20 - 29 mmol/L 27  Glucose 70 - 99 mg/dL 102 (H)  BUN 8 - 27 mg/dL 16  Creatinine 7.25 - 3.66 mg/dL 4.40  Calcium 8.6 - 34.7 mg/dL 9.2  BUN/Creatinine Ratio 10 - 24  17  eGFR >59 mL/min/1.73 85  Magnesium 1.6 - 2.3 mg/dL 1.7    Latest  Reference Range & Units 04/12/22 12:06  Total CHOL/HDL Ratio  2  Cholesterol 0 - 200 mg/dL 98  HDL Cholesterol >42.59 mg/dL 56.38  LDL (calc) 0 - 99 mg/dL 37  MICROALB/CREAT RATIO 0.0 - 30.0 mg/g 18.1  NonHDL  56.50  Triglycerides 0.0 - 149.0 mg/dL 75.6  VLDL 0.0 - 43.3 mg/dL 29.5    Latest Reference Range & Units 04/12/22 12:06  Creatinine,U mg/dL 188.4  Microalb, Ur 0.0 -  1.9 mg/dL 84.6 (H)  MICROALB/CREAT RATIO 0.0 - 30.0 mg/g 18.1     ASSESSMENT / PLAN / RECOMMENDATIONS:   1) Type 2 Diabetes Mellitus, poorly controlled, With macrovascular  complications - Most recent A1c of 11.9%. Goal A1c < 7.0 %.     -Patient has been noted worsening glycemic control, this is due to an accurate insulin regimen intake.  Unfortunately, he has been taken less Lantus than previously prescribed, and for the NovoLog it appears he has been using an old correction scale -Today we discussed the importance of taking Lantus 50 units once daily and to take NovoLog 4 units with each meal as a baseline and to add extra if Premeal BG >155 mg/dL  - NGE-95 and Islet cell Ab's are negative  - GLP-1 agonists  cost prohibitive , I offered PA program forms but he opted to hold off at the time - NOT a candidate for SGLT-2i due to DKA 02/2022  MEDICATIONS: Continue Metformin 500 mg XR, 2 tabs daily  Increase Lantus 50 units daily  Take NovoLog 4 units 3 times daily before every meal Continue Novolog per correction scale (BG-125/30) TIDQAC   EDUCATION / INSTRUCTIONS: BG monitoring instructions: Patient is instructed to check his blood sugars 1 times a day. Call [facility] if: BG persistently < 70  I reviewed the Rule of 15 for the treatment of hypoglycemia in detail with the patient. Literature supplied.    2) Diabetic complications:  Eye: Does not have known diabetic retinopathy.  Neuro/ Feet: Does not have known diabetic peripheral neuropathy .  Renal: Patient does not have known  baseline CKD. He   is not on an ACEI/ARB at present.    F/U in 3 months      Signed electronically by: Lyndle Herrlich, MD  [facility] Endocrinology  [facility] [address] [place], [place] [address] Phone: [phone] FAX: [phone]   CC: Corine Shelter, MD [address] [place] [address] Phone: [phone]  Fax: [phone]  Return to Endocrinology clinic as below: Future Appointments  Date Time Provider Department Center  06/18/2023  8:30 AM Meagan Spease, Konrad Dolores, MD [facility] None  07/21/2023  9:20 AM Christell Constant, MD [facility]

## 2023-07-17 ENCOUNTER — Other Ambulatory Visit: Payer: Self-pay | Admitting: Internal Medicine

## 2023-07-19 ENCOUNTER — Other Ambulatory Visit: Payer: Self-pay | Admitting: Internal Medicine

## 2023-07-21 ENCOUNTER — Encounter: Payer: Self-pay | Admitting: Internal Medicine

## 2023-07-21 ENCOUNTER — Ambulatory Visit: Payer: Medicare Other | Attending: Internal Medicine | Admitting: Internal Medicine

## 2023-07-21 ENCOUNTER — Telehealth: Payer: Self-pay

## 2023-07-21 VITALS — BP 162/79 | HR 48 | Ht 69.0 in | Wt 206.8 lb

## 2023-07-21 DIAGNOSIS — I10 Essential (primary) hypertension: Secondary | ICD-10-CM | POA: Diagnosis not present

## 2023-07-21 DIAGNOSIS — E785 Hyperlipidemia, unspecified: Secondary | ICD-10-CM

## 2023-07-21 DIAGNOSIS — I25709 Atherosclerosis of coronary artery bypass graft(s), unspecified, with unspecified angina pectoris: Secondary | ICD-10-CM

## 2023-07-21 DIAGNOSIS — I152 Hypertension secondary to endocrine disorders: Secondary | ICD-10-CM

## 2023-07-21 DIAGNOSIS — I700 Atherosclerosis of aorta: Secondary | ICD-10-CM | POA: Diagnosis not present

## 2023-07-21 DIAGNOSIS — R011 Cardiac murmur, unspecified: Secondary | ICD-10-CM

## 2023-07-21 DIAGNOSIS — E1159 Type 2 diabetes mellitus with other circulatory complications: Secondary | ICD-10-CM

## 2023-07-21 DIAGNOSIS — R001 Bradycardia, unspecified: Secondary | ICD-10-CM

## 2023-07-21 MED ORDER — METOPROLOL SUCCINATE ER 25 MG PO TB24: 12.5000 mg | ORAL_TABLET | Freq: Every day | ORAL | 3 refills | Status: AC

## 2023-07-21 MED ORDER — CHLORTHALIDONE 25 MG PO TABS: 25.0000 mg | ORAL_TABLET | Freq: Every day | ORAL | 3 refills | Status: AC

## 2023-07-21 MED ORDER — INSULIN LISPRO (1 UNIT DIAL) 100 UNIT/ML (KWIKPEN): PEN_INJECTOR | SUBCUTANEOUS | 6 refills | Status: DC

## 2023-07-21 NOTE — Telephone Encounter (Signed)
 Per fax from [facility] is preferred.

## 2023-07-21 NOTE — Progress Notes (Signed)
 Cardiology Office Note:  .    Date:  07/21/2023  ID:  Alec Jacobson, DOB [birth year], MRN [id number] PCP: Jacqueline Matsu, MD  [facility] Providers Cardiologist:  Jann Melody, MD     CC: CAD f/u  History of Present Illness: .    Alec Jacobson is a 78 y.o. male with non-obstructive CAD.  Alec Jacobson is a 78 year old male with nonobstructive coronary artery disease who presents for secondary prevention.  He has a history of nonobstructive coronary artery disease, aortic atherosclerosis, hyperlipidemia, mild aortic regurgitation, hypertension, and diabetes. His heart rate has been slower than before, which is partially attributed to his medication regimen. An EKG performed by another practitioner was misread as atrial fibrillation, but there was no evidence of atrial fibrillation upon review.  No chest pain, chest pressure, or shortness of breath. He feels good and has the energy to perform his daily activities, including volunteering, which involves a fair amount of physical activity. He has not needed to use nitroglycerin .  His blood pressure has been slightly elevated, but he has been monitoring it at home. Some of his medications cause drowsiness, leading to fatigue in the afternoons after being active.  His current medications include metoprolol , which causes some sluggishness, and hydrochlorothiazide  for blood pressure management. He is actively involved in his church and continues to volunteer regularly.  Discussed the use of AI scribe software for clinical note transcription with the patient, who gave verbal consent to proceed.  Relevant histories: .  Social: active in his church and comes with his wife 2022:  He has a moderate RCA lesion that was FFR negative.  Started on BB and PRN nitro.  2023: Had positional Pain; has elevated BP. Saw APP 7/23.  Amb BP monitoring was recommended 2024 Seen soon after his DKA visit end of 2023.  Had low K and elevated  BP.  Started MRA.  Had echo showing mild AI. ROS: As per HPI.   Studies Reviewed: .   Cardiac Studies & Procedures   ______________________________________________________________________________________________     ECHOCARDIOGRAM  ECHOCARDIOGRAM COMPLETE 04/25/2022  Narrative ECHOCARDIOGRAM REPORT    Patient Name:   Alec Jacobson Date of Exam: 04/25/2022 Medical Rec #:  [id number]      Height:       69.0 in Accession #:    [id number]     Weight:       200.6 lb Date of Birth:  26-May-1945     BSA:          2.069 m Patient Age:    76 years       BP:           123/76 mmHg Patient Gender: M              HR:           56 bpm. Exam Location:  [address]  Procedure: 2D Echo, 3D Echo, Cardiac Doppler and Color Doppler  Indications:    R01.1 Murmur  History:        Patient has no prior history of Echocardiogram examinations. Signs/Symptoms:Murmur and Edema; Risk Factors:Hypertension and Diabetes.  Sonographer:    Ewing Holiday RDCS Referring Phys: [facility] [place]  IMPRESSIONS   1. Left ventricular ejection fraction, by estimation, is 60 to 65%. Left ventricular ejection fraction by 3D volume is 64 %. The left ventricle has normal function. The left ventricle has no regional wall motion abnormalities. There is mild left ventricular  hypertrophy of the basal-septal segment. Left ventricular diastolic parameters are consistent with Grade I diastolic dysfunction (impaired relaxation). 2. Right ventricular systolic function is normal. The right ventricular size is normal. There is normal pulmonary artery systolic pressure. 3. The mitral valve is normal in structure. Trivial mitral valve regurgitation. 4. The aortic valve is tricuspid. Aortic valve regurgitation is mild. Aortic valve sclerosis is present, with no evidence of aortic valve stenosis. 5. The inferior vena cava is normal in size with greater than 50% respiratory variability, suggesting right atrial pressure of 3  mmHg.  Comparison(s): No prior Echocardiogram.  FINDINGS Left Ventricle: Left ventricular ejection fraction, by estimation, is 60 to 65%. Left ventricular ejection fraction by 3D volume is 64 %. The left ventricle has normal function. The left ventricle has no regional wall motion abnormalities. The left ventricular internal cavity size was normal in size. There is mild left ventricular hypertrophy of the basal-septal segment. Left ventricular diastolic parameters are consistent with Grade I diastolic dysfunction (impaired relaxation).  Right Ventricle: The right ventricular size is normal. No increase in right ventricular wall thickness. Right ventricular systolic function is normal. There is normal pulmonary artery systolic pressure. The tricuspid regurgitant velocity is 2.21 m/s, and with an assumed right atrial pressure of 3 mmHg, the estimated right ventricular systolic pressure is 22.5 mmHg.  Left Atrium: Left atrial size was normal in size.  Right Atrium: Right atrial size was normal in size.  Pericardium: There is no evidence of pericardial effusion.  Mitral Valve: The mitral valve is normal in structure. Trivial mitral valve regurgitation.  Tricuspid Valve: The tricuspid valve is normal in structure. Tricuspid valve regurgitation is trivial.  Aortic Valve: The aortic valve is tricuspid. Aortic valve regurgitation is mild. Aortic regurgitation PHT measures 903 msec. Aortic valve sclerosis is present, with no evidence of aortic valve stenosis.  Pulmonic Valve: The pulmonic valve was normal in structure. Pulmonic valve regurgitation is mild.  Aorta: The aortic root and ascending aorta are structurally normal, with no evidence of dilitation.  Venous: The inferior vena cava is normal in size with greater than 50% respiratory variability, suggesting right atrial pressure of 3 mmHg.  IAS/Shunts: The atrial septum is grossly normal.   LEFT VENTRICLE PLAX 2D LVIDd:         4.80 cm          Diastology LVIDs:         2.80 cm         LV e' medial:    10.80 cm/s LV PW:         1.00 cm         LV E/e' medial:  6.9 LV IVS:        1.00 cm         LV e' lateral:   9.32 cm/s LVOT diam:     2.10 cm         LV E/e' lateral: 8.0 LV SV:         84 LV SV Index:   41 LVOT Area:     3.46 cm        3D Volume EF LV 3D EF:    Left ventricul ar ejection fraction by 3D volume is 64 %.  3D Volume EF: 3D EF:        64 % LV EDV:       134 ml LV ESV:       48 ml LV SV:  86 ml  RIGHT VENTRICLE RV Basal diam:  3.20 cm TAPSE (M-mode): 2.4 cm  LEFT ATRIUM             Index        RIGHT ATRIUM           Index LA diam:        4.40 cm 2.13 cm/m   RA Area:     12.90 cm LA Vol (A2C):   50.7 ml 24.51 ml/m  RA Volume:   28.90 ml  13.97 ml/m LA Vol (A4C):   51.1 ml 24.70 ml/m LA Biplane Vol: 51.5 ml 24.90 ml/m AORTIC VALVE LVOT Vmax:   106.50 cm/s LVOT Vmean:  67.900 cm/s LVOT VTI:    0.242 m AI PHT:      903 msec  AORTA Ao Root diam: 3.30 cm Ao Asc diam:  3.70 cm  MITRAL VALVE               TRICUSPID VALVE MV Area (PHT): cm         TR Peak grad:   19.5 mmHg MV Decel Time: 245 msec    TR Vmax:        221.00 cm/s MV E velocity: 74.25 cm/s MV A velocity: 79.85 cm/s  SHUNTS MV E/A ratio:  0.93        Systemic VTI:  0.24 m Systemic Diam: 2.10 cm  Riccardo Chamberlain MD Electronically signed by Riccardo Chamberlain MD Signature Date/Time: 04/25/2022/12:23:27 PM    Final      CT SCANS  CT CORONARY FRACTIONAL FLOW RESERVE DATA PREP 01/19/2021  Narrative EXAM: FFRCT ANALYSIS  FINDINGS: FFRct analysis was performed on the original cardiac CT angiogram dataset. Diagrammatic representation of the FFRct analysis is provided in a separate PDF document in PACS. This dictation was created using the PDF document and an interactive 3D model of the results. 3D model is not available in the EMR/PACS. Normal FFR range is >0.80.  1. Left Main: findings ,  2. LAD: 0.92,  0.84 post lesion, gradual decrease to 0.70 distal  3. LCX: 0.87 distal (OM 2 is too small to model)  4. Ramus: n/a  5. RCA: 0.99, 0.84 distal to lesion  IMPRESSION: 1. Non flow limiting CAD - RCA 0.84 distal to lesion, LAD 0.84 distal to lesion.  Note: These examples are not recommendations of [employer] and only provided as examples of what other customers are doing.   Electronically Signed By: Dorothye Gathers M.D. On: 01/19/2021 13:20   CT CORONARY MORPH W/CTA COR W/SCORE 01/19/2021  Addendum 01/19/2021 12:37 PM ADDENDUM REPORT: 01/19/2021 12:35  CLINICAL DATA:  78 year old with chest pain  EXAM: Cardiac/Coronary  CTA  TECHNIQUE: The patient was scanned on a [employer].  FINDINGS: A 100 kV prospective scan was triggered in the descending thoracic aorta at 111 HU's. Axial non-contrast 3 mm slices were carried out through the heart. The data set was analyzed on a dedicated work station and scored using the Agatson method. Gantry rotation speed was 250 msecs and collimation was .6 mm. No beta blockade and 0.8 mg of sl NTG was given. The 3D data set was reconstructed in 5% intervals of the 67-82 % of the R-R cycle. Diastolic phases were analyzed on a dedicated work station using MPR, MIP and VRT modes. The patient received 80 cc of contrast.  Aorta:  Normal size.  Aortic atherosclerosis.  No dissection.  Aortic Valve:  Trileaflet.  No calcifications.  Coronary  Arteries:  Normal coronary origin.  Right dominance.  RCA is a large dominant artery that gives rise to PDA and PLA. There is non calcified proximal stenosis of 50-69%. FFR is normal 0.84.  Left main is a large, short artery that gives rise to LAD and LCX arteries.  LAD is a large vessel that has proximal and mid calcified plaque. Proximal 0-24% stenosis. Mid just beyond first diagonal, mixed plaque with 25-49% stenosis. FFR is normal 0.82 just distal to lesion. As LAD continues FFR decreases  gradually to 0.70.  LCX is a non-dominant artery that gives rise to one large OM1 branch. There is no plaque. OM 2 is a small caliber vessel with mid 75-99% stenosis. FFR too small caliber to model.  Other findings:  Normal pulmonary vein drainage into the left atrium.  Normal left atrial appendage without a thrombus.  Normal size of the pulmonary artery.  Please see radiology report for non cardiac findings.  IMPRESSION: 1. Coronary calcium  score of 201 (LAD). This was 78 percentile for age and sex matched control.  2. Normal coronary origin with right dominance.  3. Moderate RCA proximal stenosis, mild to moderate mid LAD stenosis - FFR is normal. Small caliber OM2 stenosis 75-99% stenosis mid vessel, too small for PCI.  4.  Aortic atherosclerosis.   Electronically Signed By: Dorothye Gathers M.D. On: 01/19/2021 12:35  Narrative EXAM: OVER-READ INTERPRETATION  CT CHEST  The following report is an over-read performed by radiologist Dr. Alexandria Angel of [facility] on 01/19/2021. This over-read does not include interpretation of cardiac or coronary anatomy or pathology. The coronary calcium  score/coronary CTA interpretation by the cardiologist is attached.  COMPARISON:  None.  FINDINGS: Atherosclerotic calcifications in the thoracic aorta. Within the visualized portions of the thorax there are no suspicious appearing pulmonary nodules or masses, there is no acute consolidative airspace disease, no pleural effusions, no pneumothorax and no lymphadenopathy. Visualized portions of the upper abdomen straight demonstrates several small low-attenuation lesions in the visualized hepatic parenchyma, incompletely characterized on today's examination, but statistically likely to represent tiny cysts. There are no aggressive appearing lytic or blastic lesions noted in the visualized portions of the skeleton.  IMPRESSION: 1.  Aortic Atherosclerosis  (ICD10-I70.0).  Electronically Signed: By: Alexandria Angel M.D. On: 01/19/2021 10:05     ______________________________________________________________________________________________        Physical Exam:    VS:  BP (!) 162/79 (BP Location: Right Arm)   Pulse (!) 48   Ht 5\' 9"  (1.753 m)   Wt 93.8 kg   SpO2 95%   BMI 30.54 kg/m    Wt Readings from Last 3 Encounters:  07/21/23 93.8 kg  03/20/23 93.4 kg  11/21/22 94.3 kg    Gen: no distress   Neck: No JVD Cardiac: No Rubs or Gallops, no murmur, regular bradycardia radial pulses Respiratory: Clear to auscultation bilaterally, normal effort, normal  respiratory rate GI: Soft, nontender, non-distended  MS: No  edema;  moves all extremities Integument: Skin feels warm Neuro:  At time of evaluation, alert and oriented to person/place/time/situation  Psych: Normal affect, patient feels ok   ASSESSMENT AND PLAN: .    An EKG was ordered for bradycardia (last EKG) and shows Sinus bradycardia; P waves were present on last EKG but with artifact  Nonobstructive coronary artery disease Bradycardia Nonobstructive coronary artery disease with plaque buildup but adequate blood flow. Asymptomatic with no chest pain, pressure, or tightness. Bradycardia likely due to metoprolol . No atrial fibrillation  on recent EKG, previously misread. Active lifestyle indicates effective management. - Reduce metoprolol  dose to 12.5 mg with the goal to discontinue by June. - Monitor for symptoms of tachycardia or angina. - Check lab work in one weeks to assess heart rate and symptoms. - continue other meds  Hypertension Slightly elevated blood pressure. Current treatment includes hydrochlorothiazide , to be switched to chlorthalidone for improved control. Metoprolol  reduction may impact blood pressure. Advised to monitor blood pressure at home. - Switch from hydrochlorothiazide  to chlorthalidone for improved blood pressure control. - Monitor blood  pressure at home. - Check lab work in one week to monitor electrolytes and blood pressure (moved up labs because of hx of hypokalemia) - Coordinate with primary care physician, Dr. Eladio Graven, for ongoing management.  Hyperlipidemia Cholesterol levels are well-controlled. No changes needed in current management.  AI - Echo in 2027  One year with me   Gloriann Larger, MD FASE [facility] Cardiologist [facility]  [address], [address] [place], [place] [address] [phone]  10:31 AM

## 2023-07-21 NOTE — Patient Instructions (Signed)
 Medication Instructions:  Your physician has recommended you make the following change in your medication:  DECREASE: metoprolol  succinate (Toprol  XL) to 12.5 mg once daily. (Half a pill) STOP: HCT Z   START: chlorthalidone 25 mg by mouth once daily  *If you need a refill on your cardiac medications before your next appointment, please call your pharmacy*  Lab Work: IN 1 WEEK at [facility]: BMP  If you have labs (blood work) drawn today and your tests are completely normal, you will receive your results only by: [employer] (if you have MyChart) OR A paper copy in the mail If you have any lab test that is abnormal or we need to change your treatment, we will call you to review the results.  Testing/Procedures: NONE  Follow-Up: At [facility], you and your health needs are our priority.  As part of our continuing mission to provide you with exceptional heart care, our providers are all part of one team.  This team includes your primary Cardiologist (physician) and Advanced Practice Providers or APPs (Physician Assistants and Nurse Practitioners) who all work together to provide you with the care you need, when you need it.  Your next appointment:   12 month(s)  Provider:   Jann Melody, MD    We recommend signing up for the patient portal called "MyChart".  Sign up information is provided on this After Visit Summary.  MyChart is used to connect with patients for Virtual Visits (Telemedicine).  Patients are able to view lab/test results, encounter notes, upcoming appointments, etc.  Non-urgent messages can be sent to your provider as well.   To learn more about what you can do with MyChart, go to [email or web address].   Other Instructions Please monitor your BP as discussed with Dr. Paulita Boss.

## 2023-07-24 ENCOUNTER — Other Ambulatory Visit: Payer: Self-pay

## 2023-07-24 MED ORDER — INSULIN LISPRO (1 UNIT DIAL) 100 UNIT/ML (KWIKPEN): PEN_INJECTOR | SUBCUTANEOUS | 6 refills | Status: DC

## 2023-07-29 ENCOUNTER — Encounter: Payer: Self-pay | Admitting: Internal Medicine

## 2023-07-29 LAB — BASIC METABOLIC PANEL WITH GFR
BUN/Creatinine Ratio: 20 (ref 10–24)
BUN: 23 mg/dL (ref 8–27)
CO2: 23 mmol/L (ref 20–29)
Calcium: 9.4 mg/dL (ref 8.6–10.2)
Chloride: 101 mmol/L (ref 96–106)
Creatinine, Ser: 1.15 mg/dL (ref 0.76–1.27)
Glucose: 278 mg/dL — ABNORMAL HIGH (ref 70–99)
Potassium: 4.2 mmol/L (ref 3.5–5.2)
Sodium: 137 mmol/L (ref 134–144)
eGFR: 66 mL/min/{1.73_m2} (ref >59)

## 2023-08-01 ENCOUNTER — Other Ambulatory Visit: Payer: Self-pay

## 2023-08-01 ENCOUNTER — Encounter: Payer: Self-pay | Admitting: Internal Medicine

## 2023-08-01 DIAGNOSIS — Z794 Long term (current) use of insulin: Secondary | ICD-10-CM

## 2023-08-01 MED ORDER — BD PEN NEEDLE NANO U/F 32G X 4 MM MISC: 1.0000 | Freq: Four times a day (QID) | 3 refills | Status: DC

## 2023-08-10 ENCOUNTER — Other Ambulatory Visit: Payer: Self-pay | Admitting: Internal Medicine

## 2023-08-10 DIAGNOSIS — E1165 Type 2 diabetes mellitus with hyperglycemia: Secondary | ICD-10-CM

## 2023-08-17 ENCOUNTER — Other Ambulatory Visit: Payer: Self-pay | Admitting: Internal Medicine

## 2023-09-05 ENCOUNTER — Other Ambulatory Visit: Payer: Self-pay

## 2023-09-05 MED ORDER — INSULIN LISPRO (1 UNIT DIAL) 100 UNIT/ML (KWIKPEN): PEN_INJECTOR | SUBCUTANEOUS | 2 refills | Status: DC

## 2023-09-08 ENCOUNTER — Other Ambulatory Visit: Payer: Self-pay | Admitting: Internal Medicine

## 2023-09-09 ENCOUNTER — Encounter: Payer: Self-pay | Admitting: Internal Medicine

## 2023-09-09 ENCOUNTER — Ambulatory Visit: Admitting: Internal Medicine

## 2023-09-09 VITALS — BP 120/72 | HR 72 | Ht 69.0 in | Wt 208.0 lb

## 2023-09-09 DIAGNOSIS — E1159 Type 2 diabetes mellitus with other circulatory complications: Secondary | ICD-10-CM | POA: Diagnosis not present

## 2023-09-09 DIAGNOSIS — Z794 Long term (current) use of insulin: Secondary | ICD-10-CM | POA: Diagnosis not present

## 2023-09-09 DIAGNOSIS — E1165 Type 2 diabetes mellitus with hyperglycemia: Secondary | ICD-10-CM

## 2023-09-09 LAB — POCT GLYCOSYLATED HEMOGLOBIN (HGB A1C): Hemoglobin A1C: 9.1 % — AB (ref 4.0–5.6)

## 2023-09-09 LAB — POCT GLUCOSE (DEVICE FOR HOME USE): POC Glucose: 252 mg/dL — AB (ref 70–99)

## 2023-09-09 NOTE — Patient Instructions (Addendum)
-   Continue Metformin  500 mg two tablets daily  - Decrease  Lantus   46 units daily  - Increase Novolog  6 units with each meal  - Novolog  correctional insulin : Use the scale below to help guide you BEFORE each meal   Blood sugar before meal Number of units to inject  Less than 155 0 unit  156 -  185 1 units  186 -  215 2 units  216 -  245 3 units  246 -  275 4 units  276 -  305 5 units  306 -  335 6 units  336 -  365 7 units  366 -  395 8 units   HOW TO TREAT LOW BLOOD SUGARS (Blood sugar LESS THAN 70 MG/DL) Please follow the RULE OF 15 for the treatment of hypoglycemia treatment (when your (blood sugars are less than 70 mg/dL)   STEP 1: Take 15 grams of carbohydrates when your blood sugar is low, which includes:  3-4 GLUCOSE TABS  OR 3-4 OZ OF JUICE OR REGULAR SODA OR ONE TUBE OF GLUCOSE GEL    STEP 2: RECHECK blood sugar in 15 MINUTES STEP 3: If your blood sugar is still low at the 15 minute recheck --> then, go back to STEP 1 and treat AGAIN with another 15 grams of carbohydrates.

## 2023-09-09 NOTE — Progress Notes (Signed)
 Name: Alec Jacobson  Age/ Sex: 78 y.o., male   MRN/ DOB: [id number], Dec 25, 1945     PCP: Jacqueline Matsu, MD   Reason for Endocrinology Evaluation: Type 2 Diabetes Mellitus  Initial Endocrine Consultative Visit: 05/24/2015    PATIENT IDENTIFIER: Alec Jacobson is a 78 y.o. male with a past medical history of CAD, DM, dyslipidemia . The patient has followed with Endocrinology clinic since 05/24/2015 for consultative assistance with management of his diabetes.  DIABETIC HISTORY:  Alec Jacobson was diagnosed with T1DM in 2017. His hemoglobin A1c has ranged from 5.8 % in 2018, peaking at 14.9% in 2021.   GLP-1 agonists were cost prohibitive  He was followed by Dr. Washington Hacker from 2017 until 05/2021  On his initial visit with me had an A1c of 10.0%, he was on basal insulin  only, metformin  started 10/2021  SUBJECTIVE:  During the last visit (03/20/2023): A1c 11.9%   Today (09/09/2023): Alec Jacobson is here for a follow up on diabetes management. He is accompanied by his spouse today. He checks his blood sugars multiples times daily,through CGM   Patient continues to follow-up with cardiology for HTN  Denies nausea, vomiting  Denies constipation or diarrhea   HOME DIABETES REGIMEN:  Metformin  500 mg, 2 tabs daily Lantus  50 units daily Novolog  4 units TIDQAC Novolog  per correction scale (BG-125/30)    Statin: yes ACE-I/ARB: yes   CONTINUOUS GLUCOSE MONITORING RECORD INTERPRETATION: Did not bring the receiver     DIABETIC COMPLICATIONS: Microvascular complications:   Denies: CKD  Last Eye Exam: Completed 2022  Macrovascular complications:  CAD Denies:  CVA, PVD   HISTORY:  Past Medical History:  Past Medical History:  Diagnosis Date   Cancer (HCC)    prostate   Chronic kidney disease    over 30 yrs ago, had one stone   Diabetes mellitus without complication (HCC)    H/O allergic rhinitis    job related   Hypertension    Past Surgical History:  Past  Surgical History:  Procedure Laterality Date   NASAL SINUS SURGERY     SINUS ENDO WITH FUSION Bilateral 04/13/2014   Procedure: SINUS ENDO WITH FUSION;  Surgeon: Littie Rife, MD;  Location: [facility] OR;  Service: ENT;  Laterality: Bilateral;   TONSILLECTOMY     Social History:  reports that he has never smoked. He has never used smokeless tobacco. He reports that he does not drink alcohol and does not use drugs. Family History:  Family History  Problem Relation Age of Onset   Aneurysm Father 30   Diabetes Sister      HOME MEDICATIONS: Allergies as of 09/09/2023   No Known Allergies      Medication List        Accurate as of September 09, 2023  7:38 AM. If you have any questions, ask your nurse or doctor.          amLODipine  5 MG tablet Commonly known as: NORVASC  Take 1 tablet (5 mg total) by mouth daily.   amoxicillin 500 MG capsule Commonly known as: AMOXIL Take 500 mg by mouth every 8 (eight) hours.   aspirin  EC 81 MG tablet Take 81 mg by mouth daily. Swallow whole.   BD Pen Needle Nano U/F 32G X 4 MM Misc Generic drug: Insulin  Pen Needle 1 Device by Other route in the morning, at noon, in the evening, and at bedtime. USE WITH INSULIN  PENS TO  DISPENSE INSULIN  DAILY AS DIRECTED   blood glucose  meter kit and supplies Dispense based on patient and insurance preference. Use up to four times daily as directed. (FOR ICD-9 250.00, 250.01).   chlorthalidone  25 MG tablet Commonly known as: HYGROTON  Take 1 tablet (25 mg total) by mouth daily.   ezetimibe  10 MG tablet Commonly known as: ZETIA  TAKE 1 TABLET BY MOUTH EVERY DAY   insulin  lispro 100 UNIT/ML KwikPen Commonly known as: HumaLOG  KwikPen Take Humalog  4 units 3 times daily before every meal Continue Humalog  per correction scale (BG-125/30) TIDQA Max daily does 30 units   Lantus  SoloStar 100 UNIT/ML Solostar Pen Generic drug: insulin  glargine Inject 50 Units into the skin daily.   metFORMIN  500 MG 24 hr  tablet Commonly known as: GLUCOPHAGE -XR TAKE 2 TABLETS BY MOUTH DAILY  WITH BREAKFAST   metoprolol  succinate 25 MG 24 hr tablet Commonly known as: TOPROL -XL Take 0.5 tablets (12.5 mg total) by mouth daily.   naproxen sodium 550 MG tablet Commonly known as: ANAPROX as needed for moderate pain (pain score 4-6).   nitroGLYCERIN  0.4 MG SL tablet Commonly known as: NITROSTAT  Place 1 tablet (0.4 mg total) under the tongue every 5 (five) minutes as needed for chest pain (Take this medicine by mouth as needed. At the first sign of chest pain or tightness place one tablet under your tongue. Let the tablet dissolve under the tongue. Do not swallow whole.  Do not eat or drink, smoke while a tablet is dissolving. If you are not better within 5 minutes after taking ONE dose of nitroglycerin , you may take a SECOND dose. IF YOU ARE GETTING TO THE 3RD DOSE CALL 911.  Do not take more than 3 nitroglycerin  tablet.).   rosuvastatin  40 MG tablet Commonly known as: CRESTOR  TAKE 1 TABLET BY MOUTH DAILY   spironolactone  25 MG tablet Commonly known as: ALDACTONE  TAKE ONE-HALF TABLET BY MOUTH  DAILY         OBJECTIVE:   Vital Signs: There were no vitals taken for this visit.  Wt Readings from Last 3 Encounters:  07/21/23 206 lb 12.8 oz (93.8 kg)  03/20/23 206 lb (93.4 kg)  11/21/22 208 lb (94.3 kg)     Exam: General: Pt appears well and is in NAD  Lungs: Clear with good BS bilat   Heart: RRR  Extremities: No pretibial edema. .  Neuro: MS is good with appropriate affect, pt is alert and Ox3    DM foot exam: 09/09/2023  The skin of the feet is intact without sores or ulcerations. The pedal pulses are 2+ on right and 2+ on left. The sensation is intact to a screening 5.07, 10 gram monofilament bilaterally        DATA REVIEWED:  Lab Results  Component Value Date   HGBA1C 11.9 (A) 03/20/2023   HGBA1C 8.9 (A) 11/21/2022   HGBA1C 7.8 (A) 07/18/2022     Latest Reference Range & Units  07/28/23 08:58  Sodium 134 - 144 mmol/L 137  Potassium 3.5 - 5.2 mmol/L 4.2  Chloride 96 - 106 mmol/L 101  CO2 20 - 29 mmol/L 23  Glucose 70 - 99 mg/dL 161 (H)  BUN 8 - 27 mg/dL 23  Creatinine 0.96 - 0.45 mg/dL 4.09  Calcium  8.6 - 10.2 mg/dL 9.4  BUN/Creatinine Ratio 10 - 24  20  eGFR >59 mL/min/1.73 66     ASSESSMENT / PLAN / RECOMMENDATIONS:   1) Type 2 Diabetes Mellitus, poorly controlled, With macrovascular  complications - Most recent A1c of 9.1%. Goal A1c <  7.0 %.    -A1c down from 11.9% to 9.1% - GAD-65 and Islet cell Ab's are negative  - GLP-1 agonists  cost prohibitive , I offered PA program forms but he opted to hold off at the time - NOT a candidate for SGLT-2i due to DKA 02/2022 - No glucose data, patient forgot Dexcom - Fasting BG this morning 80 Mg/DL, postprandial 161 mg/DL, will decrease basal insulin  and increase prandial insulin  based on this data    MEDICATIONS: Continue Metformin  500 mg XR, 2 tabs daily  Decrease Lantus  46 units daily  Increase NovoLog  6 units 3 times daily before every meal Continue Novolog  per correction scale (BG-125/30) TIDQAC      EDUCATION / INSTRUCTIONS: BG monitoring instructions: Patient is instructed to check his blood sugars 1 times a day. Call [place] [facility] if: BG persistently < 70  I reviewed the Rule of 15 for the treatment of hypoglycemia in detail with the patient. Literature supplied.    2) Diabetic complications:  Eye: Does not have known diabetic retinopathy.  Neuro/ Feet: Does not have known diabetic peripheral neuropathy .  Renal: Patient does not have known baseline CKD. He   is not on an ACEI/ARB at present.    F/U in 4 months      Signed electronically by: Natale Bail, MD  [facility] Endocrinology  [facility] [address] [place] [place], [place] [address] Phone: [phone] FAX: [phone]   CC: Jacqueline Matsu, MD [address] [place] [address] Phone: [phone]  Fax: [phone]  Return to Endocrinology clinic as below: Future Appointments  Date Time Provider Department Center  09/09/2023 10:50 AM Samoria Fedorko, Julian Obey, MD [id number] None

## 2023-12-16 ENCOUNTER — Other Ambulatory Visit: Payer: Self-pay | Admitting: Internal Medicine

## 2023-12-16 DIAGNOSIS — E1165 Type 2 diabetes mellitus with hyperglycemia: Secondary | ICD-10-CM

## 2024-01-09 ENCOUNTER — Encounter: Payer: Self-pay | Admitting: Internal Medicine

## 2024-01-09 ENCOUNTER — Other Ambulatory Visit

## 2024-01-09 ENCOUNTER — Ambulatory Visit (INDEPENDENT_AMBULATORY_CARE_PROVIDER_SITE_OTHER): Admitting: Internal Medicine

## 2024-01-09 VITALS — BP 136/82 | HR 85 | Ht 69.0 in | Wt 210.0 lb

## 2024-01-09 DIAGNOSIS — Z794 Long term (current) use of insulin: Secondary | ICD-10-CM | POA: Diagnosis not present

## 2024-01-09 DIAGNOSIS — E1165 Type 2 diabetes mellitus with hyperglycemia: Secondary | ICD-10-CM | POA: Diagnosis not present

## 2024-01-09 DIAGNOSIS — E1159 Type 2 diabetes mellitus with other circulatory complications: Secondary | ICD-10-CM | POA: Diagnosis not present

## 2024-01-09 LAB — POCT GLYCOSYLATED HEMOGLOBIN (HGB A1C): Hemoglobin A1C: 8.3 % — AB (ref 4.0–5.6)

## 2024-01-09 MED ORDER — RYBELSUS 7 MG PO TABS: 7.0000 mg | ORAL_TABLET | Freq: Every day | ORAL | 3 refills | Status: AC

## 2024-01-09 MED ORDER — LANTUS SOLOSTAR 100 UNIT/ML ~~LOC~~ SOPN: 46.0000 [IU] | PEN_INJECTOR | Freq: Every day | SUBCUTANEOUS | 2 refills | Status: AC

## 2024-01-09 MED ORDER — INSULIN LISPRO (1 UNIT DIAL) 100 UNIT/ML (KWIKPEN): PEN_INJECTOR | SUBCUTANEOUS | 3 refills | Status: AC

## 2024-01-09 MED ORDER — RYBELSUS 3 MG PO TABS: 3.0000 mg | ORAL_TABLET | Freq: Every day | ORAL | 0 refills | Status: AC

## 2024-01-09 MED ORDER — METFORMIN HCL ER 500 MG PO TB24: 1000.0000 mg | ORAL_TABLET | Freq: Every day | ORAL | 3 refills | Status: AC

## 2024-01-09 NOTE — Patient Instructions (Addendum)
-   Start Rybelsus 3 mg, 1 tablet half an hour before breakfast for 1 month, then increase to 7 mg daily - Continue Metformin  500 mg two tablets daily  - Continue Lantus   46 units daily  - Decrease Novolog  4 units with each meal  - Novolog  correctional insulin : Use the scale below to help guide you BEFORE each meal   Blood sugar before meal Number of units to inject  Less than 165 0 unit  166 - 195 1 units  196 - 230 2 units  231 - 265 3 units  266 - 300 4 units  301 - 335 5 units  336 - 370 6 units  371 - 405 7 units   HOW TO TREAT LOW BLOOD SUGARS (Blood sugar LESS THAN 70 MG/DL) Please follow the RULE OF 15 for the treatment of hypoglycemia treatment (when your (blood sugars are less than 70 mg/dL)   STEP 1: Take 15 grams of carbohydrates when your blood sugar is low, which includes:  3-4 GLUCOSE TABS  OR 3-4 OZ OF JUICE OR REGULAR SODA OR ONE TUBE OF GLUCOSE GEL    STEP 2: RECHECK blood sugar in 15 MINUTES STEP 3: If your blood sugar is still low at the 15 minute recheck --> then, go back to STEP 1 and treat AGAIN with another 15 grams of carbohydrates.

## 2024-01-09 NOTE — Progress Notes (Signed)
 Name: Alec Jacobson  Age/ Sex: 78 y.o., male   MRN/ DOB: [id number], 11-25-1945     PCP: Hillman Bare, MD   Reason for Endocrinology Evaluation: Type 2 Diabetes Mellitus  Initial Endocrine Consultative Visit: 05/24/2015    PATIENT IDENTIFIER: [name] is a 78 y.o. male with a past medical history of CAD, DM, dyslipidemia . The patient has followed with Endocrinology clinic since 05/24/2015 for consultative assistance with management of his diabetes.  DIABETIC HISTORY:  [name] was diagnosed with T1DM in 2017. His hemoglobin A1c has ranged from 5.8 % in 2018, peaking at 14.9% in 2021.   GLP-1 agonists were cost prohibitive patient opted not to proceed with patient assistance in 2024  He was followed by Dr. Kassie from 2017 until 05/2021  On his initial visit with me had an A1c of 10.0%, he was on basal insulin  only, metformin  started 10/2021  SUBJECTIVE:  During the last visit (09/09/2023): A1c 9.1%   Today (01/09/2024): [name] is here for a follow up on diabetes management. He is accompanied by his spouse today. He checks his blood sugars multiples times daily,through CGM   Patient continues to follow-up with cardiology for HTN, hydrochlorothiazide  was switched to chlorthalidone , metoprolol  was reduced   The patient is complaining of feeling sleepy in the middle of the day, but he also does stay late at night.  Patient attributes daytime sleepiness to medications  No changes in bowel movements  HOME DIABETES REGIMEN:  Metformin  500 mg, 2 tabs daily Lantus  46 units daily Novolog  6 units TIDQAC Novolog  per correction scale (BG-125/30)    Statin: yes ACE-I/ARB: yes   CONTINUOUS GLUCOSE MONITORING RECORD INTERPRETATION    Dates of Recording: 10/4 - 01/09/2024  Sensor description: Dexcom  Results statistics:   CGM use % of time 94%  Average and SD 199/63  Time in range     37   %  % Time Above 180 41  % Time above 250 20  % Time Below  target 0   Glycemic patterns summary: BGs are high overnight and fluctuate during the day  Hyperglycemic episodes postprandial  Hypoglycemic episodes occurred rare during the day  Overnight periods: Mostly high     DIABETIC COMPLICATIONS: Microvascular complications:   Denies: CKD  Last Eye Exam: Completed 2022  Macrovascular complications:  CAD Denies:  CVA, PVD   HISTORY:  Past Medical History:  Past Medical History:  Diagnosis Date   Cancer (HCC)    prostate   Chronic kidney disease    over 30 yrs ago, had one stone   Diabetes mellitus without complication (HCC)    H/O allergic rhinitis    job related   Hypertension    Past Surgical History:  Past Surgical History:  Procedure Laterality Date   NASAL SINUS SURGERY     SINUS ENDO WITH FUSION Bilateral 04/13/2014   Procedure: SINUS ENDO WITH FUSION;  Surgeon: Merilee Kraft, MD;  Location: [facility] OR;  Service: ENT;  Laterality: Bilateral;   TONSILLECTOMY     Social History:  reports that he has never smoked. He has never used smokeless tobacco. He reports that he does not drink alcohol and does not use drugs. Family History:  Family History  Problem Relation Age of Onset   Aneurysm Father 33   Diabetes Sister      HOME MEDICATIONS: Allergies as of 01/09/2024   No Known Allergies      Medication List  Accurate as of January 09, 2024  8:36 AM. If you have any questions, ask your nurse or doctor.          amLODipine  5 MG tablet Commonly known as: NORVASC  Take 1 tablet (5 mg total) by mouth daily.   amoxicillin 500 MG capsule Commonly known as: AMOXIL Take 500 mg by mouth every 8 (eight) hours.   aspirin  EC 81 MG tablet Take 81 mg by mouth daily. Swallow whole.   BD Pen Needle Nano U/F 32G X 4 MM Misc Generic drug: Insulin  Pen Needle 1 Device by Other route in the morning, at noon, in the evening, and at bedtime. USE WITH INSULIN  PENS TO  DISPENSE INSULIN  DAILY AS DIRECTED   blood  glucose meter kit and supplies Dispense based on patient and insurance preference. Use up to four times daily as directed. (FOR ICD-9 250.00, 250.01).   chlorthalidone  25 MG tablet Commonly known as: HYGROTON  Take 1 tablet (25 mg total) by mouth daily.   ezetimibe  10 MG tablet Commonly known as: ZETIA  TAKE 1 TABLET BY MOUTH EVERY DAY   insulin  lispro 100 UNIT/ML KwikPen Commonly known as: HumaLOG  KwikPen Take Humalog  4 units 3 times daily before every meal Continue Humalog  per correction scale (BG-125/30) TIDQA Max daily does 30 units   Lantus  SoloStar 100 UNIT/ML Solostar Pen Generic drug: insulin  glargine Inject 46 Units into the skin daily.   metFORMIN  500 MG 24 hr tablet Commonly known as: GLUCOPHAGE -XR TAKE 2 TABLETS BY MOUTH DAILY  WITH BREAKFAST   metoprolol  succinate 25 MG 24 hr tablet Commonly known as: TOPROL -XL Take 0.5 tablets (12.5 mg total) by mouth daily.   naproxen sodium 550 MG tablet Commonly known as: ANAPROX as needed for moderate pain (pain score 4-6).   nitroGLYCERIN  0.4 MG SL tablet Commonly known as: NITROSTAT  Place 1 tablet (0.4 mg total) under the tongue every 5 (five) minutes as needed for chest pain (Take this medicine by mouth as needed. At the first sign of chest pain or tightness place one tablet under your tongue. Let the tablet dissolve under the tongue. Do not swallow whole.  Do not eat or drink, smoke while a tablet is dissolving. If you are not better within 5 minutes after taking ONE dose of nitroglycerin , you may take a SECOND dose. IF YOU ARE GETTING TO THE 3RD DOSE CALL 911.  Do not take more than 3 nitroglycerin  tablet.).   rosuvastatin  40 MG tablet Commonly known as: CRESTOR  TAKE 1 TABLET BY MOUTH DAILY   spironolactone  25 MG tablet Commonly known as: ALDACTONE  TAKE ONE-HALF TABLET BY MOUTH  DAILY         OBJECTIVE:   Vital Signs: BP 136/82 (BP Location: Left Arm, Patient Position: Sitting, Cuff Size: Normal)   Pulse 85    Ht 5' 9 (1.753 m)   Wt 210 lb (95.3 kg)   SpO2 97%   BMI 31.01 kg/m   Wt Readings from Last 3 Encounters:  01/09/24 210 lb (95.3 kg)  09/09/23 208 lb (94.3 kg)  07/21/23 206 lb 12.8 oz (93.8 kg)     Exam: General: Pt appears well and is in NAD  Lungs: Clear with good BS bilat   Heart: RRR  Extremities: No pretibial edema. .  Neuro: MS is good with appropriate affect, pt is alert and Ox3    DM foot exam: 09/09/2023  The skin of the feet is intact without sores or ulcerations. The pedal pulses are 2+ on right and 2+  on left. The sensation is intact to a screening 5.07, 10 gram monofilament bilaterally        DATA REVIEWED:  Lab Results  Component Value Date   HGBA1C 8.3 (A) 01/09/2024   HGBA1C 9.1 (A) 09/09/2023   HGBA1C 11.9 (A) 03/20/2023     Latest Reference Range & Units 07/28/23 08:58  Sodium 134 - 144 mmol/L 137  Potassium 3.5 - 5.2 mmol/L 4.2  Chloride 96 - 106 mmol/L 101  CO2 20 - 29 mmol/L 23  Glucose 70 - 99 mg/dL 721 (H)  BUN 8 - 27 mg/dL 23  Creatinine 9.23 - 8.72 mg/dL 8.84  Calcium  8.6 - 10.2 mg/dL 9.4  BUN/Creatinine Ratio 10 - 24  20  eGFR >59 mL/min/1.73 66     ASSESSMENT / PLAN / RECOMMENDATIONS:   1) Type 2 Diabetes Mellitus, poorly controlled, With macrovascular  complications - Most recent A1c of 8.3%. Goal A1c < 7.0 %.    -A1c down from 9.1% to 8.3% -GAD-65 and Islet cell Ab's are negative  - NOT a candidate for SGLT-2i due to DKA 02/2022 - We have entertained GLP-1 agonist, we opted to proceed with Rybelsus rather than Ozempic at this time, caution against GI side effects - I will decrease prandial dose of insulin  preemptively to prevent hypoglycemia - I will also change his sensitivity factor from 30 to 35   -MA/CR ratio pending today  MEDICATIONS: Start Rybelsus 3 mg, 1 tablet daily for 30 days, then increase to 7 mg daily Continue Metformin  500 mg XR, 2 tabs daily  Continue Lantus  46 units daily  Decrease NovoLog  4 units 3  times daily before every meal Change Novolog  per correction scale (BG-130/35) TIDQAC      EDUCATION / INSTRUCTIONS: BG monitoring instructions: Patient is instructed to check his blood sugars 1 times a day. Call [facility] if: BG persistently < 70  I reviewed the Rule of 15 for the treatment of hypoglycemia in detail with the patient. Literature supplied.    2) Diabetic complications:  Eye: Does not have known diabetic retinopathy.  Neuro/ Feet: Does not have known diabetic peripheral neuropathy .  Renal: Patient does not have known baseline CKD. He   is not on an ACEI/ARB at present.    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, as he tends to stay up late at night and is tired during the day - I have suggested switching taking metoprolol  to the evening time and see how this helps - I do not believe his glycemic agents have anything to do with this   4) Microalbuminuria :   - This is new - We will episodes the importance of optimizing glucose control, if this remains elevated we will consider ACE inhibitors or an ARB   F/U in 6 months     In addition to time spent performing glucose monitor, I spent 30 minutes preparing to see the patient by review of recent labs, imaging and procedures, obtaining and reviewing separately obtained history, communicating with the patient/family or caregiver, ordering medications, tests or procedures, and documenting clinical information in the EHR including the differential Dx, treatment, and any further evaluation and other management    Signed electronically by: Stefano Redgie Butts, MD  [facility] Endocrinology  [facility] [address] [place] [place], [place] [address] Phone: [phone] FAX: [phone]   CC: Hillman Bare, MD [address] [place]  [address] Phone: [phone]  Fax: [phone]  Return to Endocrinology  clinic as below: No future appointments.

## 2024-01-10 LAB — MICROALBUMIN / CREATININE URINE RATIO
Creatinine, Urine: 157 mg/dL (ref 20–320)
Microalb Creat Ratio: 146 mg/g{creat} — ABNORMAL HIGH (ref <30)
Microalb, Ur: 23 mg/dL

## 2024-01-12 ENCOUNTER — Ambulatory Visit: Payer: Self-pay | Admitting: Internal Medicine

## 2024-02-05 ENCOUNTER — Other Ambulatory Visit (HOSPITAL_COMMUNITY): Payer: Self-pay | Admitting: Pulmonary Disease

## 2024-02-05 DIAGNOSIS — R519 Headache, unspecified: Secondary | ICD-10-CM

## 2024-02-16 ENCOUNTER — Ambulatory Visit (HOSPITAL_COMMUNITY)

## 2024-02-17 ENCOUNTER — Ambulatory Visit (HOSPITAL_COMMUNITY)
Admission: RE | Admit: 2024-02-17 | Discharge: 2024-02-17 | Disposition: A | Discharge status: Home / Self Care | Source: Ambulatory Visit | Attending: Pulmonary Disease | Admitting: Pulmonary Disease

## 2024-02-17 DIAGNOSIS — R519 Headache, unspecified: Secondary | ICD-10-CM | POA: Diagnosis present

## 2024-07-12 ENCOUNTER — Ambulatory Visit: Admitting: Internal Medicine
